# Patient Record
Sex: Female | Born: 1990 | Race: Black or African American | Hispanic: No | Marital: Single | Smoking: Never smoker
Health system: Southern US, Community
[De-identification: ages and names within clinical notes are randomized; demographics above are authoritative.]

## PROBLEM LIST (undated history)

## (undated) ENCOUNTER — Inpatient Hospital Stay: Payer: Self-pay

## (undated) ENCOUNTER — Inpatient Hospital Stay (HOSPITAL_COMMUNITY): Payer: Self-pay

## (undated) DIAGNOSIS — D649 Anemia, unspecified: Secondary | ICD-10-CM

## (undated) DIAGNOSIS — R110 Nausea: Secondary | ICD-10-CM

## (undated) HISTORY — DX: Nausea: R11.0

## (undated) HISTORY — DX: Morbid (severe) obesity due to excess calories: E66.01

## (undated) HISTORY — PX: CYST REMOVAL NECK: SHX6281

---
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
## 2011-05-18 ENCOUNTER — Observation Stay: Payer: Self-pay | Admitting: Obstetrics and Gynecology

## 2011-06-05 ENCOUNTER — Inpatient Hospital Stay: Payer: Self-pay | Admitting: Obstetrics and Gynecology

## 2013-06-10 ENCOUNTER — Encounter: Payer: Self-pay | Admitting: Internal Medicine

## 2013-07-20 ENCOUNTER — Emergency Department: Payer: Self-pay | Admitting: Emergency Medicine

## 2013-07-20 LAB — RAPID INFLUENZA A&B ANTIGENS

## 2013-07-22 ENCOUNTER — Encounter: Payer: Self-pay | Admitting: Obstetrics & Gynecology

## 2013-12-05 ENCOUNTER — Ambulatory Visit: Payer: Self-pay | Admitting: Oncology

## 2013-12-09 ENCOUNTER — Ambulatory Visit: Payer: Self-pay | Admitting: Oncology

## 2013-12-12 ENCOUNTER — Observation Stay: Payer: Self-pay

## 2013-12-20 ENCOUNTER — Inpatient Hospital Stay: Payer: Self-pay | Admitting: Obstetrics and Gynecology

## 2013-12-20 LAB — CBC WITH DIFFERENTIAL/PLATELET
BASOS ABS: 0.1 10*3/uL (ref 0.0–0.1)
BASOS PCT: 0.5 %
Eosinophil #: 0 10*3/uL (ref 0.0–0.7)
Eosinophil %: 0.4 %
HCT: 30 % — AB (ref 35.0–47.0)
HGB: 9.8 g/dL — ABNORMAL LOW (ref 12.0–16.0)
LYMPHS ABS: 2.6 10*3/uL (ref 1.0–3.6)
Lymphocyte %: 21.3 %
MCH: 26.5 pg (ref 26.0–34.0)
MCHC: 32.5 g/dL (ref 32.0–36.0)
MCV: 82 fL (ref 80–100)
MONO ABS: 0.5 x10 3/mm (ref 0.2–0.9)
Monocyte %: 4.1 %
NEUTROS PCT: 73.7 %
Neutrophil #: 8.9 10*3/uL — ABNORMAL HIGH (ref 1.4–6.5)
Platelet: 263 10*3/uL (ref 150–440)
RBC: 3.68 10*6/uL — ABNORMAL LOW (ref 3.80–5.20)
RDW: 15.6 % — ABNORMAL HIGH (ref 11.5–14.5)
WBC: 12.2 10*3/uL — ABNORMAL HIGH (ref 3.6–11.0)

## 2013-12-20 LAB — GC/CHLAMYDIA PROBE AMP

## 2013-12-21 LAB — HEMATOCRIT: HCT: 23.6 % — ABNORMAL LOW (ref 35.0–47.0)

## 2014-01-08 ENCOUNTER — Ambulatory Visit: Payer: Self-pay | Admitting: Oncology

## 2014-11-01 NOTE — Op Note (Signed)
PATIENT NAME:  Laura JanskyMCMILLAN, [place]hsa MR#:  [id number] DATE OF BIRTH:  Dec 05, 1990  DATE OF PROCEDURE:  12/20/2013  PREOPERATIVE DIAGNOSIS:  Fetal intolerance to labor in a term intrauterine pregnancy.  POSTOPERATIVE DIAGNOSIS:  Fetal intolerance to labor in a term intrauterine pregnancy.   PROCEDURE PERFORMED:  Low transverse cesarean section, placement of ON-Q pain pump.   SURGEON:  Annamarie MajorPaul Madilynn Montante, M.D.   ASSISTANT:  Midwife Yetta BarreJones.   ANESTHESIA:  Spinal.   ESTIMATED BLOOD LOSS:  250 mL.   COMPLICATIONS:  None.   FINDINGS:  Normal tubes, ovaries and uterus. Viable infant weighing 7 pounds 11 ounces with Apgar scores of 9 and 10 at 1 and 5 minutes, respectively.   DISPOSITION:  To recovery room in stable condition.   TECHNIQUE:  The patient prepped and draped in the usual sterile fashion after adequate anesthesia is obtained in the supine position on the operating room table. Scalpel is used to create a low transverse skin incision down to the level of the rectus fascia, which was dissected bilaterally using Mayo scissors. Rectus muscle separated in the midline. The peritoneum is penetrated and the bladder is inferiorly dissected and retracted. Scalpel was used to create a low transverse hysterotomy incision that is extended by blunt dissection. Amniotomy reveals clear fluid. The head is easily delivered with suctioning of the oropharynx. No nuchal cord is noted and no vacuum device is needed. The remaining portion of the infant is delivered without complication.   Cord blood is obtained. The placenta is manually extracted. The uterus is externalized and cleansed of all membranes and debris using a moist sponge. Hysterotomy incision is closed with a running #1 Vicryl suture in a locking fashion, followed by a second layer to imbricate the first layer with excellent hemostasis noted.   The uterus is then placed back in the intra-abdominal cavity and the paracolic gutters are irrigated using warm  saline. Re-examination of the incision reveals excellent hemostasis. The peritoneum is closed with a Vicryl suture.   Trocars are placed through the abdomen into the subfascial space and the SilverSoaker catheters associated with the ON-Q pain pump are then threaded into place. The fascia is then closed with 0 Maxon suture with careful placement of suture not to incorporate the catheters. Subcutaneous tissues are irrigated and hemostasis is assured using electrocautery. Skin is closed with surgical clips. Bandages are applied. The ON-Q pain pump catheter is flushed with 5 mL each of bupivacaine and then stabilized into place. The patient goes to the recovery room in stable condition. All sponge, instrument and needle counts are correct.      ____________________________ R. Annamarie MajorPaul Bianca Raneri, MD rph:dmm D: 12/20/2013 11:05:22 ET T: 12/20/2013 11:11:28 ET JOB#: [id number]  cc: Dierdre Searles. Paul Derra Shartzer, MD, <Dictator> Nadara MustardOBERT P Latora Quarry MD ELECTRONICALLY SIGNED 12/20/2013 18:47

## 2014-11-18 NOTE — H&P (Signed)
L&D Evaluation:  History:  HPI 24 yo G2!001 with LMP of 03/16/13 & EDD of 12/21/13  & here as she was getting a blood transfusion and got half of the blood when she started UC's and [facility]. PMH is significant for hx of depression, GBS +, Obesity, Anemia, and Hgb of 7.7. Pt does not feel UC's now and no Vb, ROM or any concerns. Pt has appt at [facility] ion Monday and will call Dr Orlie DakinFinnegan on Friday for blood transfusion, Poor followup missing PNC from 14 to 35 weeks.   Presents with contractions   Patient's Medical History LGSIL,Lt neck cyst,   Patient's Surgical History none   Medications Pre Natal Vitamins   Allergies NKDA   Social History none   Family History Non-Contributory   ROS:  ROS All systems were reviewed.  HEENT, CNS, GI, GU, Respiratory, CV, Renal and Musculoskeletal systems were found to be normal.   Exam:  Vital Signs stable   General no apparent distress   Mental Status clear   Chest clear   Heart normal sinus rhythm, no murmur/gallop/rubs   Abdomen gravid, non-tender   Estimated Fetal Weight Average for gestational age   Back no CVAT   Edema 1+   Reflexes 1+   Mebranes Intact   FHT normal rate with no decels   Ucx irregular   Skin dry   Lymph no lymphadenopathy   Impression:  Impression IUP nat 38 5/7 weeks with Fe  def anemia   Plan:  Comments Pt will call Dr. Orlie DakinFinnegan office tomorrow and then fu at [facility] on Monday.   Electronic Signatures: Sharee PimpleJones, Subrina Vecchiarelli W (CNM)  (Signed 04-Jun-15 21:01)  Authored: L&D Evaluation   Last Updated: 04-Jun-15 21:01 by Sharee PimpleJones, Leahanna Buser W (CNM)

## 2015-05-12 ENCOUNTER — Encounter: Payer: Self-pay | Admitting: Emergency Medicine

## 2015-05-12 ENCOUNTER — Emergency Department
Admission: EM | Admit: 2015-05-12 | Discharge: 2015-05-12 | Disposition: A | Discharge status: Home / Self Care | Payer: Medicaid Other | Attending: Emergency Medicine | Admitting: Emergency Medicine

## 2015-05-12 DIAGNOSIS — R1013 Epigastric pain: Secondary | ICD-10-CM

## 2015-05-12 DIAGNOSIS — Z3202 Encounter for pregnancy test, result negative: Secondary | ICD-10-CM | POA: Diagnosis not present

## 2015-05-12 DIAGNOSIS — R112 Nausea with vomiting, unspecified: Secondary | ICD-10-CM | POA: Insufficient documentation

## 2015-05-12 DIAGNOSIS — R109 Unspecified abdominal pain: Secondary | ICD-10-CM | POA: Diagnosis present

## 2015-05-12 LAB — URINALYSIS COMPLETE WITH MICROSCOPIC (ARMC ONLY)
Bilirubin Urine: NEGATIVE
GLUCOSE, UA: NEGATIVE mg/dL
Hgb urine dipstick: NEGATIVE
KETONES UR: NEGATIVE mg/dL
LEUKOCYTES UA: NEGATIVE
NITRITE: NEGATIVE
PROTEIN: NEGATIVE mg/dL
Specific Gravity, Urine: 1.029 (ref 1.005–1.030)
pH: 7 (ref 5.0–8.0)

## 2015-05-12 LAB — COMPREHENSIVE METABOLIC PANEL
ALBUMIN: 4 g/dL (ref 3.5–5.0)
ALK PHOS: 65 U/L (ref 38–126)
ALT: 30 U/L (ref 14–54)
ANION GAP: 6 (ref 5–15)
AST: 30 U/L (ref 15–41)
BUN: 16 mg/dL (ref 6–20)
CALCIUM: 8.5 mg/dL — AB (ref 8.9–10.3)
CHLORIDE: 106 mmol/L (ref 101–111)
CO2: 26 mmol/L (ref 22–32)
Creatinine, Ser: 0.64 mg/dL (ref 0.44–1.00)
GFR calc Af Amer: >60 mL/min (ref >60)
GFR calc non Af Amer: >60 mL/min (ref >60)
GLUCOSE: 98 mg/dL (ref 65–99)
POTASSIUM: 3.8 mmol/L (ref 3.5–5.1)
SODIUM: 138 mmol/L (ref 135–145)
Total Bilirubin: 0.4 mg/dL (ref 0.3–1.2)
Total Protein: 7.3 g/dL (ref 6.5–8.1)

## 2015-05-12 LAB — CBC
HEMATOCRIT: 39.1 % (ref 35.0–47.0)
HEMOGLOBIN: 12.9 g/dL (ref 12.0–16.0)
MCH: 29.2 pg (ref 26.0–34.0)
MCHC: 33.1 g/dL (ref 32.0–36.0)
MCV: 88.4 fL (ref 80.0–100.0)
PLATELETS: 234 10*3/uL (ref 150–440)
RBC: 4.42 MIL/uL (ref 3.80–5.20)
RDW: 13.5 % (ref 11.5–14.5)
WBC: 9.1 10*3/uL (ref 3.6–11.0)

## 2015-05-12 LAB — POCT PREGNANCY, URINE: PREG TEST UR: NEGATIVE

## 2015-05-12 MED ORDER — ONDANSETRON 4 MG PO TBDP
4.0000 mg | ORAL_TABLET | Freq: Three times a day (TID) | ORAL | Status: DC | PRN
Start: 2015-05-12 — End: 2016-03-07

## 2015-05-12 MED ORDER — HYDROMORPHONE HCL 1 MG/ML IJ SOLN: 1.0000 mg | Freq: Once | INTRAMUSCULAR | Status: DC

## 2015-05-12 MED ORDER — ONDANSETRON HCL 4 MG/2ML IJ SOLN
4.0000 mg | Freq: Once | INTRAMUSCULAR | Status: AC
  Administered 2015-05-12: 4 mg via INTRAVENOUS
  Filled 2015-05-12: qty 2

## 2015-05-12 MED ORDER — ONDANSETRON HCL 4 MG/2ML IJ SOLN: 4.0000 mg | Freq: Once | INTRAMUSCULAR | Status: DC

## 2015-05-12 MED ORDER — KETOROLAC TROMETHAMINE 30 MG/ML IJ SOLN
30.0000 mg | Freq: Once | INTRAMUSCULAR | Status: AC
  Administered 2015-05-12: 30 mg via INTRAVENOUS
  Filled 2015-05-12: qty 1

## 2015-05-12 MED ORDER — FAMOTIDINE IN NACL 20-0.9 MG/50ML-% IV SOLN
20.0000 mg | Freq: Once | INTRAVENOUS | Status: AC
  Administered 2015-05-12: 20 mg via INTRAVENOUS
  Filled 2015-05-12: qty 50

## 2015-05-12 NOTE — Discharge Instructions (Signed)
Please take a clear liquid diet for the next 12-24 hours. The advance to a bland BRAT diet as described as tolerated. Please make a follow up appointment with your primary care physician for follow-up.   Please return to the emergency department if you develop worsening abdominal pain, fever, inability to keep down fluids, or any other symptoms concerning to you.  Abdominal Pain, Adult Many things can cause abdominal pain. Usually, abdominal pain is not caused by a disease and will improve without treatment. It can often be observed and treated at home. Your health care provider will do a physical exam and possibly order blood tests and X-rays to help determine the seriousness of your pain. However, in many cases, more time must pass before a clear cause of the pain can be found. Before that point, your health care provider may not know if you need more testing or further treatment. HOME CARE INSTRUCTIONS Monitor your abdominal pain for any changes. The following actions may help to alleviate any discomfort you are experiencing:  Only take over-the-counter or prescription medicines as directed by your health care provider.  Do not take laxatives unless directed to do so by your health care provider.  Try a clear liquid diet (broth, tea, or water) as directed by your health care provider. Slowly move to a bland diet as tolerated. SEEK MEDICAL CARE IF:  You have unexplained abdominal pain.  You have abdominal pain associated with nausea or diarrhea.  You have pain when you urinate or have a bowel movement.  You experience abdominal pain that wakes you in the night.  You have abdominal pain that is worsened or improved by eating food.  You have abdominal pain that is worsened with eating fatty foods.  You have a fever. SEEK IMMEDIATE MEDICAL CARE IF:  Your pain does not go away within 2 hours.  You keep throwing up (vomiting).  Your pain is felt only in portions of the abdomen, such  as the right side or the left lower portion of the abdomen.  You pass bloody or black tarry stools. MAKE SURE YOU:  Understand these instructions.  Will watch your condition.  Will get help right away if you are not doing well or get worse.   This information is not intended to replace advice given to you by your health care provider. Make sure you discuss any questions you have with your health care provider.   Document Released: 04/06/2005 Document Revised: 03/18/2015 Document Reviewed: 03/06/2013 Elsevier Interactive Patient Education [employer]2016 Elsevier Inc.

## 2015-05-12 NOTE — ED Notes (Signed)
C/o abd. Pain with n,v and chills

## 2015-05-12 NOTE — ED Provider Notes (Signed)
[facility] Emergency Department Provider Note  ____________________________________________  Time seen: Approximately 10:16 AM  I have reviewed the triage vital signs and the nursing notes.   HISTORY  Chief Complaint Abdominal Pain    HPI Laura Lucero is a 24 y.o. female , otherwise healthy, presenting with 2 days of nausea and vomiting and epigastric pain. Patient states that since yesterday she has had "sharp" epigastric pain associated with 4-5 episodes of nausea and vomiting that is nonbloody. She denies any diarrhea or constipation. Last bowel movement was yesterday and it was normal. She is not had any fever, chills, urinary symptoms, change in vaginal discharge. She does not have any headache, lightheadedness or syncope. She has no known sick contacts. She states that she feels better if she is sleeping.    History reviewed. No pertinent past medical history.  There are no active problems to display for this patient.   History reviewed. No pertinent past surgical history.  Current Outpatient Rx  Name  Route  Sig  Dispense  Refill  . ondansetron (ZOFRAN ODT) 4 MG disintegrating tablet   Oral   Take 1 tablet (4 mg total) by mouth every 8 (eight) hours as needed for nausea or vomiting.   20 tablet   0     Allergies Review of patient's allergies indicates no known allergies.  No family history on file.  Social History Social History  Substance Use Topics  . Smoking status: Never Smoker   . Smokeless tobacco: None  . Alcohol Use: No    Review of Systems Constitutional: No fever/chills. No lightheadedness or syncope. Eyes: No visual changes. ENT: No sore throat. Cardiovascular: Denies chest pain, palpitations. Respiratory: Denies shortness of breath.  No cough. Gastrointestinal: Positive epigastric abdominal pain.  Positive nausea, positive vomiting.  No diarrhea.  No constipation. Genitourinary: Negative for dysuria. No change in  vaginal discharge. Musculoskeletal: Negative for back pain. Skin: Negative for rash. Neurological: Negative for headaches, focal weakness or numbness.  10-point ROS otherwise negative.  ____________________________________________   PHYSICAL EXAM:  VITAL SIGNS: ED Triage Vitals  Enc Vitals Group     BP 05/12/15 0851 102/77 mmHg     Pulse Rate 05/12/15 0851 89     Resp 05/12/15 0851 18     Temp 05/12/15 0851 98.9 F (37.2 C)     Temp Source 05/12/15 0851 Oral     SpO2 05/12/15 0851 99 %     Weight 05/12/15 0851 216 lb (97.977 kg)     Height 05/12/15 0851  (1.6 m)     Head Cir --      Peak Flow --      Pain Score 05/12/15 0852 7     Pain Loc --      Pain Edu? --      Excl. in GC? --     Constitutional: Alert and oriented. Well appearing and in no acute distress. Answer question appropriately. Eyes: Conjunctivae are normal.  EOMI. Head: Atraumatic. Nose: No congestion/rhinnorhea. Mouth/Throat: Mucous membranes are moist.  Neck: No stridor.  Supple.   Cardiovascular: Normal rate, regular rhythm. No murmurs, rubs or gallops.  Respiratory: Normal respiratory effort.  No retractions. Lungs CTAB.  No wheezes, rales or ronchi. Gastrointestinal: Mild epigastric tenderness to palpation without rebound or guarding. Abdomen is soft and nondistended. There are no peritoneal set signs. No Murphy sign.  Musculoskeletal: No LE edema.  Neurologic:  Normal speech and language. No gross focal neurologic deficits are appreciated.  Skin:  Skin is warm, dry and intact. No rash noted. Psychiatric: Mood and affect are normal. Speech and behavior are normal.  Normal judgement.  ____________________________________________   LABS (all labs ordered are listed, but only abnormal results are displayed)  Labs Reviewed  COMPREHENSIVE METABOLIC PANEL - Abnormal; Notable for the following:    Calcium 8.5 (*)    All other components within normal limits  URINALYSIS COMPLETEWITH MICROSCOPIC  ([facility] ONLY) - Abnormal; Notable for the following:    Color, Urine YELLOW (*)    APPearance CLEAR (*)    Bacteria, UA FEW (*)    Squamous Epithelial / LPF 6-30 (*)    All other components within normal limits  CBC  POCT PREGNANCY, URINE   ____________________________________________  EKG  Not indicated ____________________________________________  RADIOLOGY  No results found.  ____________________________________________   PROCEDURES  Procedure(s) performed: None  Critical Care performed: No ____________________________________________   INITIAL IMPRESSION / ASSESSMENT AND PLAN / ED COURSE  Pertinent labs & imaging results that were available during my care of the patient were reviewed by me and considered in my medical decision making (see chart for details).  24 y.o. female, otherwise healthy, with 2 days of nausea and vomiting, mild epigastric pain. On my exam her abdominal exam is reassuring and is not consistent with acute surgical pathology including cholecystitis. As likely etiology of her pain is a viral or foodborne GI illness. I have given her instructions about appendectomy precautions in case she worsens. The labs that she had done from triage are reassuring with normal white blood cell count and normal electrolytes. I'm awaiting her urinalysis and to rule out pregnancy.   ----------------------------------------- 10:58 AM on 05/12/2015 -----------------------------------------  The patient is resting comfortably and I had to wake her up in order to speak with her. She will give a urine sample at this time. She has not had any further episodes of vomiting at this time. I anticipate discharge home after by mouth challenge.  ----------------------------------------- 12:27 PM on 05/12/2015 -----------------------------------------  Patient's labs are reassuring, she continues to be free of any nausea or vomiting. Her pain and nausea have completely resolved.  Plan to discharge home. I have given her appendectomy precautions. She understands return precautions and follow-up instructions. ____________________________________________  FINAL CLINICAL IMPRESSION(S) / ED DIAGNOSES  Final diagnoses:  Epigastric pain  Non-intractable vomiting with nausea, vomiting of unspecified type      NEW MEDICATIONS STARTED DURING THIS VISIT:  New Prescriptions   ONDANSETRON (ZOFRAN ODT) 4 MG DISINTEGRATING TABLET    Take 1 tablet (4 mg total) by mouth every 8 (eight) hours as needed for nausea or vomiting.     Rockne MenghiniAnne-Caroline Abhijot Straughter, MD 05/12/15 1233

## 2015-05-12 NOTE — ED Notes (Signed)
Pt discharged home after verbalizing understanding of discharge instructions; nad noted. 

## 2015-10-11 ENCOUNTER — Emergency Department
Admission: EM | Admit: 2015-10-11 | Discharge: 2015-10-11 | Disposition: A | Discharge status: Home / Self Care | Payer: Medicaid Other | Attending: Emergency Medicine | Admitting: Emergency Medicine

## 2015-10-11 ENCOUNTER — Encounter: Payer: Self-pay | Admitting: Emergency Medicine

## 2015-10-11 DIAGNOSIS — J302 Other seasonal allergic rhinitis: Secondary | ICD-10-CM | POA: Diagnosis not present

## 2015-10-11 DIAGNOSIS — J029 Acute pharyngitis, unspecified: Secondary | ICD-10-CM | POA: Diagnosis not present

## 2015-10-11 DIAGNOSIS — R0982 Postnasal drip: Secondary | ICD-10-CM | POA: Insufficient documentation

## 2015-10-11 LAB — POCT RAPID STREP A: Streptococcus, Group A Screen (Direct): NEGATIVE

## 2015-10-11 MED ORDER — FLUTICASONE PROPIONATE 50 MCG/ACT NA SUSP: 2.0000 | Freq: Every day | NASAL | Status: DC

## 2015-10-11 MED ORDER — CETIRIZINE HCL 5 MG PO TABS: 5.0000 mg | ORAL_TABLET | Freq: Every day | ORAL | Status: DC

## 2015-10-11 NOTE — ED Provider Notes (Signed)
CSN: [id number]     Arrival date & time 10/11/15  1818 History   First MD Initiated Contact with Patient 10/11/15 2014     Chief Complaint  Patient presents with  . Generalized Body Aches  . Sore Throat   HPI The patient is a 25 year old female who is at [redacted] weeks gestation of a singleton pregnancy, who presents to the ED with complaints of sore throat and body aches since yesterday. She is unaware of any feversOr chills, or sweats at this time. She describes some sinus drainage as well as some postnasal drainage she denies any cough, congestion, or flulike symptoms. She denies any abdominal pain, vaginal bleeding, or pelvic discomfort.  History reviewed. No pertinent past medical history. Past Surgical History  Procedure Laterality Date  . Cyst removal neck     History reviewed. No pertinent family history. Social History  Substance Use Topics  . Smoking status: Never Smoker   . Smokeless tobacco: None  . Alcohol Use: No   OB History    Gravida Para Term Preterm AB TAB SAB Ectopic Multiple Living   1              Review of Systems  Constitutional: Negative for fever, chills and diaphoresis.  HENT: Positive for postnasal drip, rhinorrhea and sore throat. Negative for ear discharge and ear pain.   Respiratory: Negative.   Allergic/Immunologic: Positive for environmental allergies.   Allergies  Review of patient's allergies indicates no known allergies.  Home Medications   Prior to Admission medications   Medication Sig Start Date End Date Taking? Authorizing Provider  cetirizine (ZYRTEC) 5 MG tablet Take 1 tablet (5 mg total) by mouth daily. 10/11/15   Merritt Mccravy V Bacon Katana Berthold, PA-C  fluticasone (FLONASE) 50 MCG/ACT nasal spray Place 2 sprays into both nostrils daily. 10/11/15   Semir Brill V Bacon Brookelynn Hamor, PA-C  ondansetron (ZOFRAN ODT) 4 MG disintegrating tablet Take 1 tablet (4 mg total) by mouth every 8 (eight) hours as needed for nausea or vomiting. 05/12/15   Anne-Caroline Sharma Covert, MD    BP 114/69 mmHg  Pulse 107  Temp(Src) 99.3 F (37.4 C) (Oral)  Resp 18  Ht  (1.626 m)  Wt 106.595 kg  BMI 40.32 kg/m2  SpO2 99%  LMP  (LMP Unknown) Physical Exam  Constitutional: She appears well-developed and well-nourished.  HENT:  Head: Normocephalic and atraumatic.  Right Ear: External ear normal.  Left Ear: External ear normal.  Nose: Nose normal.  Mouth/Throat: Oropharynx is clear and moist. No oropharyngeal exudate.  Eyes: Conjunctivae and EOM are normal. Pupils are equal, round, and reactive to light.  Neck: Normal range of motion. Neck supple.  Skin: Skin is warm and dry.    ED Course  Procedures (including critical care time) Labs Review Labs Reviewed  CULTURE, GROUP A STREP [facility])  POCT RAPID STREP A    Imaging Review No results found. I have personally reviewed and evaluated these images and lab results as part of my medical decision-making.   EKG Interpretation None      MDM   Final diagnoses:  Sore throat  Other seasonal allergic rhinitis  Post-nasal drip    Patient with an exam consistent with some mild rhinitis and postnasal drainage. This is likely the source a scratchy, sore throat." Patient's exam gives low suspicion for strep pharyngitis. Throat culture is pending at the time of discharge. Patient will be discharged with prescriptions for Flonase, and cetirizine the dose as directed. She will [facility]  [facility].    Charlesetta IvoryJenise V Bacon WeatherlyMenshew, PA-C 10/11/15 2101  Jennye MoccasinBrian S Quigley, MD 10/12/15 [phone]

## 2015-10-11 NOTE — Discharge Instructions (Signed)
Allergic Rhinitis Allergic rhinitis is when the mucous membranes in the nose respond to allergens. Allergens are particles in the air that cause your body to have an allergic reaction. This causes you to release allergic antibodies. Through a chain of events, these eventually cause you to release histamine into the blood stream. Although meant to protect the body, it is this release of histamine that causes your discomfort, such as frequent sneezing, congestion, and an itchy, runny nose.  CAUSES Seasonal allergic rhinitis (hay fever) is caused by pollen allergens that may come from grasses, trees, and weeds. Year-round allergic rhinitis (perennial allergic rhinitis) is caused by allergens such as house dust mites, pet dander, and mold spores. SYMPTOMS  Nasal stuffiness (congestion).  Itchy, runny nose with sneezing and tearing of the eyes. DIAGNOSIS Your health care provider can help you determine the allergen or allergens that trigger your symptoms. If you and your health care provider are unable to determine the allergen, skin or blood testing may be used. Your health care provider will diagnose your condition after taking your health history and performing a physical exam. Your health care provider may assess you for other related conditions, such as asthma, pink eye, or an ear infection. TREATMENT Allergic rhinitis does not have a cure, but it can be controlled by:  Medicines that block allergy symptoms. These may include allergy shots, nasal sprays, and oral antihistamines.  Avoiding the allergen. Hay fever may often be treated with antihistamines in pill or nasal spray forms. Antihistamines block the effects of histamine. There are over-the-counter medicines that may help with nasal congestion and swelling around the eyes. Check with your health care provider before taking or giving this medicine. If avoiding the allergen or the medicine prescribed do not work, there are many new medicines  your health care provider can prescribe. Stronger medicine may be used if initial measures are ineffective. Desensitizing injections can be used if medicine and avoidance does not work. Desensitization is when a patient is given ongoing shots until the body becomes less sensitive to the allergen. Make sure you follow up with your health care provider if problems continue. HOME CARE INSTRUCTIONS It is not possible to completely avoid allergens, but you can reduce your symptoms by taking steps to limit your exposure to them. It helps to know exactly what you are allergic to so that you can avoid your specific triggers. SEEK MEDICAL CARE IF:  You have a fever.  You develop a cough that does not stop easily (persistent).  You have shortness of breath.  You start wheezing.  Symptoms interfere with normal daily activities.   This information is not intended to replace advice given to you by your health care provider. Make sure you discuss any questions you have with your health care provider.   Document Released: 03/22/2001 Document Revised: 07/18/2014 Document Reviewed: 03/04/2013 Elsevier Interactive Patient Education 2016 Elsevier Inc.  Sore Throat A sore throat is pain, burning, irritation, or scratchiness of the throat. There is often pain or tenderness when swallowing or talking. A sore throat may be accompanied by other symptoms, such as coughing, sneezing, fever, and swollen neck glands. A sore throat is often the first sign of another sickness, such as a cold, flu, strep throat, or mononucleosis (commonly known as mono). Most sore throats go away without medical treatment. CAUSES  The most common causes of a sore throat include:  A viral infection, such as a cold, flu, or mono.  A bacterial infection, such as  strep throat, tonsillitis, or whooping cough.  Seasonal allergies.  Dryness in the air.  Irritants, such as smoke or pollution.  Gastroesophageal reflux disease  (GERD). HOME CARE INSTRUCTIONS   Only take over-the-counter medicines as directed by your caregiver.  Drink enough fluids to keep your urine clear or pale yellow.  Rest as needed.  Try using throat sprays, lozenges, or sucking on hard candy to ease any pain (if older than 4 years or as directed).  Sip warm liquids, such as broth, herbal tea, or warm water with honey to relieve pain temporarily. You may also eat or drink cold or frozen liquids such as frozen ice pops.  Gargle with salt water (mix 1 tsp salt with 8 oz of water).  Do not smoke and avoid secondhand smoke.  Put a cool-mist humidifier in your bedroom at night to moisten the air. You can also turn on a hot shower and sit in the bathroom with the door closed for 5-10 minutes. SEEK IMMEDIATE MEDICAL CARE IF:  You have difficulty breathing.  You are unable to swallow fluids, soft foods, or your saliva.  You have increased swelling in the throat.  Your sore throat does not get better in 7 days.  You have nausea and vomiting.  You have a fever or persistent symptoms for more than 2-3 days.  You have a fever and your symptoms suddenly get worse. MAKE SURE YOU:   Understand these instructions.  Will watch your condition.  Will get help right away if you are not doing well or get worse.   This information is not intended to replace advice given to you by your health care provider. Make sure you discuss any questions you have with your health care provider.   Document Released: 08/04/2004 Document Revised: 07/18/2014 Document Reviewed: 03/04/2012 Elsevier Interactive Patient Education [employer]  Your exam is normal today. Your rapid strep test is negative. Your throat culture is pending. Take the prescription meds as directed. [facility]

## 2015-10-11 NOTE — ED Notes (Signed)
Pt reports body aches and sore throat since yesterday.  Denies fevers.  Is [redacted] weeks pregnant but no complaints or sx related to pregnancy.  NAD.

## 2015-10-14 LAB — CULTURE, GROUP A STREP (THRC)

## 2015-10-15 NOTE — Progress Notes (Addendum)
ED Culture Report Follow up by Pharmacy  Throat culture report from 10/11/15 growing Group A strep, no abx given on discharge. Called pt's phone number listed [phone]). Number states "subscriber you have dialed is not in service." Called pharmacy listed in chart and called in prescription: [facility] on [address], spoke with Heidi Dachharles RPh at [phone]. Called in amoxicillin 500 mg PO BID x10 days, #20, no refills (Dr. Lenard LancePaduchowski).  Called the [facility] (listed in ED note as PCP). Spoke to Rock[place] who gave me [phone] as the pt's number. This number states "subscriber does not accept incoming calls." Faxed culture results to [facility] at [phone] Verlee Monte(Kelsey Walch, PA is the pt's provider) at [phone].

## 2015-11-24 LAB — HM PAP SMEAR: HM PAP: NEGATIVE

## 2015-12-08 ENCOUNTER — Emergency Department: Payer: Medicaid Other

## 2015-12-08 ENCOUNTER — Encounter: Payer: Self-pay | Admitting: Medical Oncology

## 2015-12-08 ENCOUNTER — Emergency Department
Admission: EM | Admit: 2015-12-08 | Discharge: 2015-12-08 | Disposition: A | Discharge status: Home / Self Care | Payer: Medicaid Other | Attending: Emergency Medicine | Admitting: Emergency Medicine

## 2015-12-08 DIAGNOSIS — S63501A Unspecified sprain of right wrist, initial encounter: Secondary | ICD-10-CM | POA: Insufficient documentation

## 2015-12-08 DIAGNOSIS — Z79899 Other long term (current) drug therapy: Secondary | ICD-10-CM | POA: Insufficient documentation

## 2015-12-08 DIAGNOSIS — X509XXA Other and unspecified overexertion or strenuous movements or postures, initial encounter: Secondary | ICD-10-CM | POA: Diagnosis not present

## 2015-12-08 DIAGNOSIS — Y999 Unspecified external cause status: Secondary | ICD-10-CM | POA: Insufficient documentation

## 2015-12-08 DIAGNOSIS — Y929 Unspecified place or not applicable: Secondary | ICD-10-CM | POA: Diagnosis not present

## 2015-12-08 DIAGNOSIS — S6991XA Unspecified injury of right wrist, hand and finger(s), initial encounter: Secondary | ICD-10-CM | POA: Diagnosis present

## 2015-12-08 DIAGNOSIS — Y9389 Activity, other specified: Secondary | ICD-10-CM | POA: Insufficient documentation

## 2015-12-08 MED ORDER — TRAMADOL HCL 50 MG PO TABS
50.0000 mg | ORAL_TABLET | Freq: Once | ORAL | Status: AC
  Administered 2015-12-08: 50 mg via ORAL
  Filled 2015-12-08: qty 1

## 2015-12-08 MED ORDER — IBUPROFEN 800 MG PO TABS: 800.0000 mg | ORAL_TABLET | Freq: Three times a day (TID) | ORAL | Status: DC | PRN

## 2015-12-08 MED ORDER — IBUPROFEN 800 MG PO TABS
800.0000 mg | ORAL_TABLET | Freq: Once | ORAL | Status: DC
  Filled 2015-12-08: qty 1

## 2015-12-08 MED ORDER — TRAMADOL HCL 50 MG PO TABS: 50.0000 mg | ORAL_TABLET | Freq: Four times a day (QID) | ORAL | Status: DC | PRN
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
## 2015-12-08 NOTE — Discharge Instructions (Signed)
Wear splint for 3-5 days as needed. °

## 2015-12-08 NOTE — ED Notes (Signed)
Pt reports injuring rt hand last night.
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
## 2016-02-12 LAB — OB RESULTS CONSOLE GBS: GBS: POSITIVE

## 2016-03-07 ENCOUNTER — Encounter
Admission: EM | Disposition: A | Discharge status: Home / Self Care | Payer: Self-pay | Source: Home / Self Care | Attending: Obstetrics and Gynecology

## 2016-03-07 ENCOUNTER — Inpatient Hospital Stay: Payer: Medicaid Other | Admitting: Anesthesiology

## 2016-03-07 ENCOUNTER — Other Ambulatory Visit: Payer: Medicaid Other

## 2016-03-07 ENCOUNTER — Inpatient Hospital Stay
Admission: EM | Admit: 2016-03-07 | Discharge: 2016-03-11 | DRG: 765 | Disposition: A | Discharge status: Home / Self Care | Payer: Medicaid Other | Attending: Obstetrics and Gynecology | Admitting: Obstetrics and Gynecology

## 2016-03-07 ENCOUNTER — Encounter
Admission: RE | Admit: 2016-03-07 | Discharge: 2016-03-07 | Disposition: A | Discharge status: Home / Self Care | Payer: Medicaid Other | Source: Ambulatory Visit | Attending: Obstetrics and Gynecology | Admitting: Obstetrics and Gynecology

## 2016-03-07 ENCOUNTER — Observation Stay
Admission: RE | Admit: 2016-03-07 | Discharge: 2016-03-07 | Disposition: A | Discharge status: Home / Self Care | Payer: Medicaid Other | Source: Ambulatory Visit | Attending: Obstetrics and Gynecology | Admitting: Obstetrics and Gynecology

## 2016-03-07 ENCOUNTER — Inpatient Hospital Stay: Admit: 2016-03-07 | Payer: Self-pay

## 2016-03-07 DIAGNOSIS — O0990 Supervision of high risk pregnancy, unspecified, unspecified trimester: Secondary | ICD-10-CM

## 2016-03-07 DIAGNOSIS — Z3A40 40 weeks gestation of pregnancy: Secondary | ICD-10-CM | POA: Diagnosis not present

## 2016-03-07 DIAGNOSIS — D62 Acute posthemorrhagic anemia: Secondary | ICD-10-CM | POA: Diagnosis present

## 2016-03-07 DIAGNOSIS — O34211 Maternal care for low transverse scar from previous cesarean delivery: Secondary | ICD-10-CM | POA: Diagnosis present

## 2016-03-07 DIAGNOSIS — Z3A39 39 weeks gestation of pregnancy: Secondary | ICD-10-CM

## 2016-03-07 DIAGNOSIS — O34219 Maternal care for unspecified type scar from previous cesarean delivery: Secondary | ICD-10-CM

## 2016-03-07 DIAGNOSIS — O9902 Anemia complicating childbirth: Secondary | ICD-10-CM | POA: Diagnosis present

## 2016-03-07 DIAGNOSIS — Z98891 History of uterine scar from previous surgery: Secondary | ICD-10-CM

## 2016-03-07 HISTORY — DX: Anemia, unspecified: D64.9

## 2016-03-07 LAB — CBC
HEMATOCRIT: 29.3 % — AB (ref 35.0–47.0)
HEMOGLOBIN: 9.9 g/dL — AB (ref 12.0–16.0)
MCH: 28.3 pg (ref 26.0–34.0)
MCHC: 33.9 g/dL (ref 32.0–36.0)
MCV: 83.5 fL (ref 80.0–100.0)
Platelets: 244 10*3/uL (ref 150–440)
RBC: 3.51 MIL/uL — ABNORMAL LOW (ref 3.80–5.20)
RDW: 14.4 % (ref 11.5–14.5)
WBC: 8.9 10*3/uL (ref 3.6–11.0)

## 2016-03-07 LAB — RAPID HIV SCREEN (HIV 1/2 AB+AG)
HIV 1/2 ANTIBODIES: NONREACTIVE
HIV-1 P24 Antigen - HIV24: NONREACTIVE

## 2016-03-07 LAB — TYPE AND SCREEN
ABO/RH(D): A POS
ANTIBODY SCREEN: NEGATIVE
EXTEND SAMPLE REASON: UNDETERMINED

## 2016-03-07 SURGERY — Surgical Case
Anesthesia: Spinal | Site: Abdomen | Wound class: Clean Contaminated

## 2016-03-07 MED ORDER — KETOROLAC TROMETHAMINE 30 MG/ML IJ SOLN
30.0000 mg | Freq: Four times a day (QID) | INTRAMUSCULAR | Status: AC
  Administered 2016-03-07 – 2016-03-08 (×3): 30 mg via INTRAVENOUS
  Filled 2016-03-07 (×3): qty 1

## 2016-03-07 MED ORDER — LACTATED RINGERS IV SOLN
INTRAVENOUS | Status: DC
  Administered 2016-03-07: 22:00:00 via INTRAVENOUS

## 2016-03-07 MED ORDER — SOD CITRATE-CITRIC ACID 500-334 MG/5ML PO SOLN
30.0000 mL | ORAL | Status: AC
Start: 2016-03-07 — End: 2016-03-07
  Administered 2016-03-07: 30 mL via ORAL
  Filled 2016-03-07: qty 30

## 2016-03-07 MED ORDER — LACTATED RINGERS IV SOLN: 500.0000 mL | INTRAVENOUS | Status: DC | PRN

## 2016-03-07 MED ORDER — LACTATED RINGERS IV SOLN
INTRAVENOUS | Status: DC | PRN
Start: 2016-03-07 — End: 2016-03-07
  Administered 2016-03-07 (×2): via INTRAVENOUS

## 2016-03-07 MED ORDER — BUPIVACAINE HCL (PF) 0.5 % IJ SOLN: 5.0000 mL | Freq: Once | INTRAMUSCULAR | Status: DC

## 2016-03-07 MED ORDER — BUPIVACAINE IN DEXTROSE 0.75-8.25 % IT SOLN
INTRATHECAL | Status: DC | PRN
  Administered 2016-03-07: 1.8 mL via INTRATHECAL

## 2016-03-07 MED ORDER — MORPHINE SULFATE (PF) 0.5 MG/ML IJ SOLN
INTRAMUSCULAR | Status: DC | PRN
  Administered 2016-03-07: .1 mg via INTRATHECAL

## 2016-03-07 MED ORDER — ACETAMINOPHEN 325 MG PO TABS
650.0000 mg | ORAL_TABLET | Freq: Four times a day (QID) | ORAL | Status: AC
  Administered 2016-03-08 (×4): 650 mg via ORAL
  Filled 2016-03-07 (×3): qty 2

## 2016-03-07 MED ORDER — BUPIVACAINE 0.25 % ON-Q PUMP DUAL CATH 400 ML
400.0000 mL | INJECTION | Status: DC
  Filled 2016-03-07: qty 400

## 2016-03-07 MED ORDER — LIDOCAINE HCL (PF) 1 % IJ SOLN: 30.0000 mL | INTRAMUSCULAR | Status: DC | PRN

## 2016-03-07 MED ORDER — FENTANYL CITRATE (PF) 100 MCG/2ML IJ SOLN
INTRAMUSCULAR | Status: DC | PRN
  Administered 2016-03-07: 15 ug via INTRATHECAL

## 2016-03-07 MED ORDER — GENTAMICIN SULFATE 40 MG/ML IJ SOLN
5.0000 mg/kg | INTRAVENOUS | Status: AC
  Administered 2016-03-07: 544.4 mg via INTRAVENOUS
  Filled 2016-03-07: qty 13.5

## 2016-03-07 MED ORDER — ONDANSETRON HCL 4 MG/2ML IJ SOLN: 4.0000 mg | Freq: Four times a day (QID) | INTRAMUSCULAR | Status: DC | PRN

## 2016-03-07 MED ORDER — OXYTOCIN 40 UNITS IN LACTATED RINGERS INFUSION - SIMPLE MED
2.5000 [IU]/h | INTRAVENOUS | Status: DC
  Administered 2016-03-07: 1 mL via INTRAVENOUS
  Administered 2016-03-07: 399 mL via INTRAVENOUS

## 2016-03-07 MED ORDER — TERBUTALINE SULFATE 1 MG/ML IJ SOLN: 0.2500 mg | Freq: Once | INTRAMUSCULAR | Status: DC

## 2016-03-07 MED ORDER — NALBUPHINE HCL 10 MG/ML IJ SOLN
5.0000 mg | INTRAMUSCULAR | Status: DC | PRN
  Administered 2016-03-07 – 2016-03-08 (×3): 5 mg via INTRAVENOUS
  Filled 2016-03-07 (×3): qty 1

## 2016-03-07 MED ORDER — PHENYLEPHRINE HCL 10 MG/ML IJ SOLN
INTRAMUSCULAR | Status: DC | PRN
  Administered 2016-03-07 (×2): 100 ug via INTRAVENOUS
  Administered 2016-03-07: 50 ug via INTRAVENOUS

## 2016-03-07 MED ORDER — KETOROLAC TROMETHAMINE 30 MG/ML IJ SOLN
INTRAMUSCULAR | Status: AC
  Filled 2016-03-07: qty 1

## 2016-03-07 MED ORDER — NALBUPHINE HCL 10 MG/ML IJ SOLN
INTRAMUSCULAR | Status: AC
  Filled 2016-03-07: qty 1

## 2016-03-07 MED ORDER — CLINDAMYCIN PHOSPHATE 900 MG/50ML IV SOLN
900.0000 mg | INTRAVENOUS | Status: AC
  Administered 2016-03-07: 900 mg via INTRAVENOUS
  Filled 2016-03-07: qty 50

## 2016-03-07 MED ORDER — OXYTOCIN BOLUS FROM INFUSION: 500.0000 mL | Freq: Once | INTRAVENOUS | Status: DC

## 2016-03-07 MED ORDER — BUPIVACAINE HCL (PF) 0.5 % IJ SOLN
INTRAMUSCULAR | Status: DC | PRN
  Administered 2016-03-07: 10 mL

## 2016-03-07 MED ORDER — NALBUPHINE HCL 10 MG/ML IJ SOLN
5.0000 mg | INTRAMUSCULAR | Status: DC | PRN
  Administered 2016-03-08: 5 mg via SUBCUTANEOUS

## 2016-03-07 MED ORDER — OXYTOCIN 10 UNIT/ML IJ SOLN: 10.0000 [IU] | Freq: Once | INTRAMUSCULAR | Status: DC

## 2016-03-07 MED ORDER — BUPIVACAINE HCL (PF) 0.5 % IJ SOLN
5.0000 mL | Freq: Once | INTRAMUSCULAR | Status: DC
  Filled 2016-03-07: qty 30

## 2016-03-07 MED ORDER — ONDANSETRON HCL 4 MG/2ML IJ SOLN
INTRAMUSCULAR | Status: DC | PRN
Start: 2016-03-07 — End: 2016-03-07
  Administered 2016-03-07: 4 mg via INTRAVENOUS

## 2016-03-07 MED ORDER — KETOROLAC TROMETHAMINE 30 MG/ML IJ SOLN: 30.0000 mg | Freq: Four times a day (QID) | INTRAMUSCULAR | Status: AC

## 2016-03-07 MED ORDER — BETAMETHASONE SOD PHOS & ACET 6 (3-3) MG/ML IJ SUSP: 12.0000 mg | Freq: Once | INTRAMUSCULAR | Status: DC

## 2016-03-07 SURGICAL SUPPLY — 32 items
CANISTER SUCT 3000ML (MISCELLANEOUS) ×4 IMPLANT
CATH KIT ON-Q SILVERSOAK 5IN (CATHETERS) ×8 IMPLANT
CHLORAPREP W/TINT 26ML (MISCELLANEOUS) ×8 IMPLANT
CLOSURE WOUND 1/2 X4 (GAUZE/BANDAGES/DRESSINGS)
CUP MEDICINE 2OZ PLAST GRAD ST (MISCELLANEOUS) IMPLANT
DRSG OPSITE POSTOP 4X10 (GAUZE/BANDAGES/DRESSINGS) ×4 IMPLANT
DRSG TELFA 3X8 NADH (GAUZE/BANDAGES/DRESSINGS) IMPLANT
ELECT REM PT RETURN 9FT ADLT (ELECTROSURGICAL) ×4
ELECTRODE REM PT RTRN 9FT ADLT (ELECTROSURGICAL) ×2 IMPLANT
GAUZE SPONGE 4X4 12PLY STRL (GAUZE/BANDAGES/DRESSINGS) IMPLANT
GLOVE BIO SURGEON STRL SZ8 (GLOVE) ×8 IMPLANT
GLOVE BIOGEL PI IND STRL 7.5 (GLOVE) ×4 IMPLANT
GLOVE BIOGEL PI INDICATOR 7.5 (GLOVE) ×4
GLOVE SKINSENSE NS SZ7.0 (GLOVE) ×4
GLOVE SKINSENSE STRL SZ7.0 (GLOVE) ×4 IMPLANT
GOWN STRL REUS W/ TWL LRG LVL3 (GOWN DISPOSABLE) ×4 IMPLANT
GOWN STRL REUS W/TWL LRG LVL3 (GOWN DISPOSABLE) ×4
LIQUID BAND (GAUZE/BANDAGES/DRESSINGS) ×8 IMPLANT
NDL SAFETY 22GX1.5 (NEEDLE) IMPLANT
NS IRRIG 1000ML POUR BTL (IV SOLUTION) ×12 IMPLANT
PACK C SECTION AR (MISCELLANEOUS) ×4 IMPLANT
PAD OB MATERNITY 4.3X12.25 (PERSONAL CARE ITEMS) ×4 IMPLANT
PAD PREP 24X41 OB/GYN DISP (PERSONAL CARE ITEMS) ×4 IMPLANT
SLEEVE SCD COMPRESS THIGH MED (MISCELLANEOUS) ×4 IMPLANT
STRIP CLOSURE SKIN 1/2X4 (GAUZE/BANDAGES/DRESSINGS) IMPLANT
SUT 2-0 PL GUT LIGAPAK (SUTURE) IMPLANT
SUT MNCRL AB 4-0 PS2 18 (SUTURE) ×4 IMPLANT
SUT PDS AB 1 TP1 96 (SUTURE) ×8 IMPLANT
SUT VIC AB 0 CT1 36 (SUTURE) ×12 IMPLANT
SUT VIC AB 1 CT1 36 (SUTURE) ×4 IMPLANT
SWABSTK COMLB BENZOIN TINCTURE (MISCELLANEOUS) IMPLANT
SYRINGE 10CC LL (SYRINGE) IMPLANT

## 2016-03-07 NOTE — Patient Instructions (Signed)
  Your procedure is scheduled on: March 08, 2016 (Tuesday) Report to EMERGENCY DEPARTMENT To find out your arrival time please call [phone] between 1PM - 3PM on ARRIVAL TIME 5:30 AM  Remember: Instructions that are not followed completely may result in serious medical risk, up to and including death, or upon the discretion of your surgeon and anesthesiologist your surgery may need to be rescheduled.    _x___ 1. Do not eat food or drink liquids after midnight. No gum chewing or hard candies.     _x__ 2. No Alcohol for 24 hours before or after surgery.   x__3. No Smoking for 24 prior to surgery.   ____  4. Bring all medications with you on the day of surgery if instructed.    __x__ 5. Notify your doctor if there is any change in your medical condition     (cold, fever, infections).     Do not wear jewelry, make-up, hairpins, clips or nail polish.  Do not wear lotions, powders, or perfumes. You may wear deodorant.  Do not shave 48 hours prior to surgery. Men may shave face and neck.  Do not bring valuables to the hospital.    [facility] is not responsible for any belongings or valuables.               Contacts, dentures or bridgework may not be worn into surgery.  Leave your suitcase in the car. After surgery it may be brought to your room.  For patients admitted to the hospital, discharge time is determined by your treatment team.   Patients discharged the day of surgery will not be allowed to drive home.    Please read over the following fact sheets that you were given:   [facility] Preparing for Surgery and or MRSA Information   ___ Take these medicines the morning of surgery with A SIP OF WATER:    1.   2.  3.  4.  5.  6.  ____ Fleet Enema (as directed)   _x___ Use CHG Soap or sage wipes as directed on instruction sheet   ____ Use inhalers on the day of surgery and bring to hospital day of surgery  ____ Stop metformin 2 days prior to surgery    ____ Take  1/2 of usual insulin dose the night before surgery and none on the morning of  Surgery.          ____ Stop aspirin or coumadin, or plavix  _x__ Stop Anti-inflammatories such as Advil, Aleve, Ibuprofen, Motrin, Naproxen,          Naprosyn, Goodies powders or aspirin products. Ok to take Tylenol.   ____ Stop supplements until after surgery.    ____ Bring C-Pap to the hospital.

## 2016-03-07 NOTE — OB Triage Note (Signed)
Pt presents to L&D with contractions. Was seen in L&D today and sent home, pt is scheduled for a c/s in the morning. Reports contractions increasing at 1700 with vaginal bleeding. No LOF, and reports good faeatl movemnt

## 2016-03-07 NOTE — Transfer of Care (Signed)
Immediate Anesthesia Transfer of Care Note  Patient: Laura Lucero  Procedure(s) Performed: Procedure(s): CESAREAN SECTION (N/A)  Patient Location: PACU  Anesthesia Type:Spinal  Level of Consciousness: awake, alert , oriented and patient cooperative  Airway & Oxygen Therapy: Patient Spontanous Breathing  Post-op Assessment: Report given to RN and Post -op Vital signs reviewed and stable  Post vital signs: Reviewed and stable  Last Vitals:  Vitals:   03/07/16 2104  BP: 136/76  Pulse: 69  Resp: 18  Temp: 36.4 C    Last Pain:  Vitals:   03/07/16 2106  TempSrc:   PainSc: 10-Worst pain ever         Complications: No apparent anesthesia complications

## 2016-03-07 NOTE — Pre-Procedure Instructions (Signed)
Arrived at Pre-Admit Testing for pre-op interview for C-Section tomorrow (August 29) c/o contractions. Labor and Delivery notified, instructed to complete pre-op interview,then send patient to L&D. Patient  stated with C-Section in 2015, she received medication during labor that caused her "throat to swell", and could not remember the name of medication.Patient escorted to L&D after pre-op visit , [facility] including info regarding medication allergy.

## 2016-03-07 NOTE — Discharge Summary (Signed)
  OB Discharge Summary     Patient Name: Laura JanskyLarisa Mariotti DOB: 02/29/1991 MRN: [id number]  Date of admission: 03/07/2016 Delivering MD: Conard NovakJackson, Stephen D, MD  Date of Delivery: 03/07/2016  Date of discharge: 03/11/2016  Admitting diagnosis: contractions term pregnancy,prior csection,desire for permanent sterility Intrauterine pregnancy: 39wk2d     Secondary diagnosis: None     Discharge diagnosis: Term Pregnancy Delivered                                                                                                Post partum procedures:none  Augmentation: n/a  Complications: None  Hospital course:  The patient presented in active labor with scheduled cesarean section for the next morning.  She presented at 4-5cm dilation and was taken to the operating room for a repeat cesarean delivery, which occurred without incident.  Her postpartum/postoperative course was uncomplicated.   Physical exam  Vitals:   03/10/16 1958 03/10/16 2349 03/11/16 0259 03/11/16 0756  BP: 116/61   123/60  Pulse: 82   80  Resp: 18   16  Temp: 98.7 F (37.1 C) 98.6 F (37 C) 98.3 F (36.8 C) 98.2 F (36.8 C)  TempSrc: Oral Oral Oral Oral  SpO2: 98%   98%  Weight:      Height:       General: alert and no distress Lochia: appropriate Uterine Fundus: firm Incision: Healing well with no significant drainage DVT Evaluation: No evidence of DVT seen on physical exam.  Labs: Lab Results  Component Value Date   WBC 11.8 (H) 03/08/2016   HGB 9.4 (L) 03/08/2016   HCT 27.7 (L) 03/08/2016   MCV 84.5 03/08/2016   PLT 203 03/08/2016    Discharge instruction: per After Visit Summary.  Medications:    Medication List    TAKE these medications   ferrous sulfate 325 (65 FE) MG tablet Take 1 tablet (325 mg total) by mouth daily with breakfast.   ibuprofen 600 MG tablet Commonly known as:  ADVIL,MOTRIN Take 1 tablet (600 mg total) by mouth every 6 (six) hours.   oxyCODONE-acetaminophen 5-325 MG  tablet Commonly known as:  PERCOCET/ROXICET Take 1 tablet by mouth every 4 (four) hours as needed for moderate pain (pain scale 4-7/10).       Diet: routine diet  Activity: Advance as tolerated. Pelvic rest for 6 weeks.   Outpatient follow up: Follow-up Information    Conard NovakJackson, Stephen D, MD In 1 week.   Specialty:  Obstetrics and Gynecology Why:  incision check Contact information: [address] [place] [address] [phone]             Postpartum contraception: IUD or nexplanon Rhogam Given postpartum: no Rubella vaccine given postpartum: yes Varicella vaccine given postpartum: no  Newborn Data:   Dan EuropeMcMillan, Boy Hopelynn [[id number]][[id number]]  Live born female  Birth Weight: 7 lb 7.6 oz (3390 g) APGAR: 7, 9   Dan EuropeMcMillan, Boy Winnifred [[id number]][[id number]]  Live born female  Birth Weight:   APGAR: ,    Baby Feeding: Bottle  Disposition:home with mother  SIGNED: Vena AustriaAndreas Tarry Blayney, MD

## 2016-03-07 NOTE — H&P (Signed)
History and Physical Interval Note:  Laura Lucero  has presented today for surgery, with the diagnosis of labor with history of previous cesarean section.  The various methods of treatment have been discussed with the patient and family. After consideration of risks, benefits and other options for treatment, the patient has consented to  Repeat cesarean delivery as a surgical intervention .  The patient's history has been reviewed, patient examined, no change in status, stable for surgery.  I have reviewed the patient's chart and labs.  Questions were answered to the patient's satisfaction.     Conard NovakJackson, Shreyas Piatkowski D, MD 03/07/2016 9:34 PM

## 2016-03-07 NOTE — Op Note (Signed)
Cesarean Section Operative Note    Laura Lucero   03/07/2016   Pre-operative Diagnosis:  1) intrauterine pregnancy at [redacted]w[redacted]d  2) history of prior cesarean section, desires repeat 3) active labor   Post-operative Diagnosis:  1) intrauterine pregnancy at [redacted]w[redacted]d  2) history of prior cesarean section, desires repeat 3) active labor  Procedure: repeat low transverse cesarean section via pfannenstiel incision with double-layer uterine closure  Surgeon: Surgeon(s) and Role:    * Conard Novak, MD - Primary   Assistants: Tresea Mall, CNM  Anesthesia: spinal   Findings:  1) normal appearing gravid uterus, fallopian tubes, and ovaries 2) viable female infant with APGARs of 8 at 1 minute and 9 at 5 minutes   Estimated Blood Loss: 750 mL  Total IV Fluids: 1,051ml   Urine Output: 75 mL clear urine at end of procedure  Specimens: None  Complications: no complications  Disposition: PACU - hemodynamically stable.   Maternal Condition: stable   Baby condition / location:  Couplet care / Skin to Skin  Procedure Details:  The patient was seen in the Holding Room. The risks, benefits, complications, treatment options, and expected outcomes were discussed with the patient. The patient concurred with the proposed plan, giving informed consent. identified as Laura Lucero and the procedure verified as C-Section Delivery. A Time Out was held and the above information confirmed.   After induction of anesthesia, the patient was draped and prepped in the usual sterile manner. A Pfannenstiel incision was made and carried down through the subcutaneous tissue to the fascia. Fascial incision was made and extended transversely. The fascia was separated from the underlying rectus tissue superiorly and inferiorly. The peritoneum was identified and entered. Peritoneal incision was extended longitudinally. The bladder flap was sharply freed from the lower uterine segment. A low transverse uterine  incision was made and the hysterotomy was extended with cranial-caudal tension. Delivered from cephalic presentation was a 3,390 gram Living newborn infant(s) or Female with Apgar scores of 8 at one minute and 9 at five minutes. Cord ph was not sent the umbilical cord was clamped and cut cord blood was not obtained for evaluation. The placenta was removed Intact and appeared normal. The uterine outline, tubes and ovaries appeared normal. The uterine incision was closed with running locked sutures of 0 Vicryl.  A second layer of the same suture was thrown in an imbricating fashion.  Hemostasis was assured.  The uterus was returned to the abdomen and the paracolic gutters were cleared of all clots and debris.  The rectus muscles were inspected and found to be hemostatic.  The On-Q catheter pumps were inserted in accordance with the manufacturer's recommendations.  The catheters were inserted approximately 4cm cephelad to the incision line, approximately 1cm apart, straddling the midline.  They were inserted to a depth of the 4th mark. They were positioned superficial to the rectus abdominus muscles and deep to the rectus fascia.    The fascia was then reapproximated with running sutures of 1-0 PDS, looped. The subcutaneous tissue was reapproximated using 3-0 vicryl with 3 interrupted vertical stitches to close dead space and reinforce the skin closure.  The subcuticular closure was performed using 4-0 monocryl. The skin closure was reinforced using surgical skin glue.  The On-Q catheters were bolused with 5 mL of 0.5% marcaine plain for a total of 10 mL.  The catheters were affixed to the skin with surgical skin glue, steri-strips, and tegaderm.    Instrument, sponge, and needle counts were  correct prior the abdominal closure and were correct at the conclusion of the case.  The patient received clindamycin 900 mg and gentamicin 540 mg IV prior to skin incision (within 30 minutes). For VTE prophylaxis she was  wearing SCDs throughout the case.   Signed: Conard NovakStephen D. Clio Gerhart, MD 03/07/2016 11:25 PM

## 2016-03-07 NOTE — Anesthesia Procedure Notes (Signed)
Spinal  Patient location during procedure: OB Start time: 03/07/2016 10:00 PM End time: 03/07/2016 10:10 PM Staffing Anesthesiologist: Averyanna Sax Preanesthetic Checklist Completed: patient identified, site marked, surgical consent, pre-op evaluation, timeout performed, IV checked, risks and benefits discussed and monitors and equipment checked Spinal Block Patient position: sitting Prep: ChloraPrep Patient monitoring: heart rate, continuous pulse ox, blood pressure and cardiac monitor Approach: midline Location: L4-5 Injection technique: single-shot Needle Needle type: Whitacre and Introducer  Needle gauge: 24 G Needle length: 9 cm Assessment Sensory level: T3 Additional Notes Negative paresthesia. Negative blood return. Positive free-flowing CSF. Expiration date of kit checked and confirmed. Patient tolerated procedure well, without complications.

## 2016-03-07 NOTE — Discharge Summary (Signed)
Physician Discharge Summary  Patient ID: Laura Lucero MRN: [id number] DOB/AGE: 24/07/1990 24 y.o.  Admit date: 03/07/2016 Discharge date: 03/07/2016  Admission Diagnoses: G3P2002 with IUP at 39 weeks by U/S with complaint of Q 4min Contractions starting early this am. Pt admits positive fetal movement. Pt denies LOF/VB. Pt has scheduled repeat C/Section tomorrow.  Discharge Diagnoses:  Active Problems:   Labor and delivery indication for care or intervention IUP with Reactive NST, no labor  Discharged Condition: good  Hospital Course: Pt was admitted for observation and placed on monitors  Consults: None  Significant Diagnostic Studies: None  Treatments: None  Discharge Exam: Blood pressure 125/68, pulse 81, temperature 98.3 F (36.8 C), temperature source Oral, resp. rate 20. General appearance: alert, cooperative, appears stated age and no distress Resp: clear to auscultation bilaterally Cardio: regular rate and rhythm GI: gravid, mild to palpation Cervix: 1.5 cm/60% effaced/-2 station on admission and without change at discharge  Toco: q 6 minutes on admission with decreasing frequency at discharge Fetal Well Being: 130 bpm baseline, moderate variability, + accelerations, - decelerations Category I tracing  Disposition: 01-Home or Self Care  Discharge Instructions    Discharge activity:    Complete by:  As directed   Pre op instructions as given   Discharge activity:  No Restrictions    Complete by:  As directed   Discharge diet:  No restrictions    Complete by:  As directed   Nothing by mouth after midnight    [facility] am for scheduled [facility] section   Medication List    STOP taking these medications   cetirizine 5 MG tablet Commonly known as:  ZYRTEC   fluticasone 50 MCG/ACT nasal spray Commonly known as:  FLONASE   ibuprofen 800 MG tablet Commonly known as:  ADVIL,MOTRIN   ondansetron 4 MG disintegrating tablet Commonly known as:  ZOFRAN  ODT   traMADol 50 MG tablet Commonly known as:  Janean SarkULTRAM        SignedTresea Mall: Alyxandra Tenbrink, CNM 03/07/2016, 12:57 PM

## 2016-03-07 NOTE — OB Triage Note (Signed)
[name] here after check in at PAT for scheduled c/s tomorrow. Reports contractions since 3 AM, denies bleeding, LOF, reports positive fetal movement

## 2016-03-07 NOTE — Anesthesia Preprocedure Evaluation (Signed)
Anesthesia Evaluation  Patient identified by MRN, date of birth, ID band Patient awake    Reviewed: Allergy & Precautions, NPO status , Patient's Chart, lab work & pertinent test results  History of Anesthesia Complications Negative for: history of anesthetic complications  Airway Mallampati: III  TM Distance: >3 FB Neck ROM: Full    Dental no notable dental hx.    Pulmonary neg pulmonary ROS, neg sleep apnea, neg COPD,    breath sounds clear to auscultation- rhonchi (-) wheezing      Cardiovascular Exercise Tolerance: Good (-) hypertension(-) CAD and (-) Past MI  Rhythm:Regular Rate:Normal - Systolic murmurs and - Diastolic murmurs    Neuro/Psych negative neurological ROS  negative psych ROS   GI/Hepatic negative GI ROS, Neg liver ROS,   Endo/Other  negative endocrine ROSneg diabetes  Renal/GU negative Renal ROS     Musculoskeletal negative musculoskeletal ROS (+)   Abdominal (+) + obese, Gravid abdomen   Peds  Hematology  (+) anemia ,   Anesthesia Other Findings [id number] presenting in labor for repeat csection, hx of one prior csection for fetal distress after maternal reaction to penicillin done under epidural anesthesia  Reproductive/Obstetrics (+) Pregnancy                             Anesthesia Physical Anesthesia Plan  ASA: II and emergent  Anesthesia Plan: Spinal   Post-op Pain Management:    Induction:   Airway Management Planned:   Additional Equipment:   Intra-op Plan:   Post-operative Plan:   Informed Consent: I have reviewed the patients History and Physical, chart, labs and discussed the procedure including the risks, benefits and alternatives for the proposed anesthesia with the patient or authorized representative who has indicated his/her understanding and acceptance.     Plan Discussed with: Anesthesiologist and CRNA  Anesthesia Plan Comments:          Lab Results  Component Value Date   WBC 8.9 03/07/2016   HGB 9.9 (L) 03/07/2016   HCT 29.3 (L) 03/07/2016   MCV 83.5 03/07/2016   PLT 244 03/07/2016    Anesthesia Quick Evaluation

## 2016-03-08 LAB — OB RESULTS CONSOLE HIV ANTIBODY (ROUTINE TESTING)
HIV: NONREACTIVE
HIV: NONREACTIVE

## 2016-03-08 LAB — CBC
HCT: 27.7 % — ABNORMAL LOW (ref 35.0–47.0)
HEMATOCRIT: 29.9 % — AB (ref 35.0–47.0)
HEMOGLOBIN: 10 g/dL — AB (ref 12.0–16.0)
Hemoglobin: 9.4 g/dL — ABNORMAL LOW (ref 12.0–16.0)
MCH: 28.3 pg (ref 26.0–34.0)
MCH: 28.6 pg (ref 26.0–34.0)
MCHC: 33.5 g/dL (ref 32.0–36.0)
MCHC: 33.8 g/dL (ref 32.0–36.0)
MCV: 84.5 fL (ref 80.0–100.0)
MCV: 84.5 fL (ref 80.0–100.0)
PLATELETS: 220 10*3/uL (ref 150–440)
PLATELETS: 203 10*3/uL (ref 150–440)
RBC: 3.54 MIL/uL — ABNORMAL LOW (ref 3.80–5.20)
RBC: 3.28 MIL/uL — ABNORMAL LOW (ref 3.80–5.20)
RDW: 14.2 % (ref 11.5–14.5)
RDW: 14.5 % (ref 11.5–14.5)
WBC: 13.8 10*3/uL — ABNORMAL HIGH (ref 3.6–11.0)
WBC: 11.8 10*3/uL — ABNORMAL HIGH (ref 3.6–11.0)

## 2016-03-08 LAB — OB RESULTS CONSOLE VARICELLA ZOSTER ANTIBODY, IGG: VARICELLA IGG: IMMUNE

## 2016-03-08 LAB — OB RESULTS CONSOLE RPR
RPR: NONREACTIVE
RPR: NONREACTIVE

## 2016-03-08 LAB — RPR: RPR: NONREACTIVE

## 2016-03-08 LAB — OB RESULTS CONSOLE HEPATITIS B SURFACE ANTIGEN: HEP B S AG: NEGATIVE

## 2016-03-08 LAB — OB RESULTS CONSOLE RUBELLA ANTIBODY, IGM: RUBELLA: NON-IMMUNE/NOT IMMUNE

## 2016-03-08 MED ORDER — DIPHENHYDRAMINE HCL 25 MG PO CAPS
25.0000 mg | ORAL_CAPSULE | ORAL | Status: DC | PRN
  Administered 2016-03-08: 25 mg via ORAL

## 2016-03-08 MED ORDER — NALBUPHINE HCL 10 MG/ML IJ SOLN: 5.0000 mg | Freq: Once | INTRAMUSCULAR | Status: DC | PRN

## 2016-03-08 MED ORDER — IBUPROFEN 600 MG PO TABS
600.0000 mg | ORAL_TABLET | Freq: Four times a day (QID) | ORAL | Status: DC
  Administered 2016-03-08 – 2016-03-09 (×2): 600 mg via ORAL
  Filled 2016-03-08 (×2): qty 1

## 2016-03-08 MED ORDER — MENTHOL 3 MG MT LOZG
1.0000 | LOZENGE | OROMUCOSAL | Status: DC | PRN
  Filled 2016-03-08: qty 9

## 2016-03-08 MED ORDER — COCONUT OIL OIL: 1.0000 "application " | TOPICAL_OIL | Status: DC | PRN

## 2016-03-08 MED ORDER — SCOPOLAMINE 1 MG/3DAYS TD PT72: 1.0000 | MEDICATED_PATCH | Freq: Once | TRANSDERMAL | Status: DC

## 2016-03-08 MED ORDER — DOCUSATE SODIUM 100 MG PO CAPS
100.0000 mg | ORAL_CAPSULE | Freq: Every day | ORAL | Status: DC
  Administered 2016-03-08 – 2016-03-11 (×4): 100 mg via ORAL
  Filled 2016-03-08 (×4): qty 1

## 2016-03-08 MED ORDER — MEPERIDINE HCL 25 MG/ML IJ SOLN: 6.2500 mg | INTRAMUSCULAR | Status: DC | PRN

## 2016-03-08 MED ORDER — LACTATED RINGERS IV SOLN
INTRAVENOUS | Status: DC
  Administered 2016-03-08: 15:00:00 via INTRAVENOUS

## 2016-03-08 MED ORDER — DIPHENHYDRAMINE HCL 25 MG PO CAPS
25.0000 mg | ORAL_CAPSULE | Freq: Four times a day (QID) | ORAL | Status: DC | PRN
  Filled 2016-03-08: qty 1

## 2016-03-08 MED ORDER — BUPIVACAINE ON-Q PAIN PUMP (FOR ORDER SET NO CHG): INJECTION | Status: DC

## 2016-03-08 MED ORDER — OXYCODONE HCL 5 MG PO TABS
5.0000 mg | ORAL_TABLET | ORAL | Status: DC | PRN
  Administered 2016-03-08 (×2): 5 mg via ORAL
  Filled 2016-03-08 (×2): qty 1

## 2016-03-08 MED ORDER — FERROUS SULFATE 325 (65 FE) MG PO TABS
325.0000 mg | ORAL_TABLET | Freq: Two times a day (BID) | ORAL | Status: DC
  Administered 2016-03-08 – 2016-03-11 (×7): 325 mg via ORAL
  Filled 2016-03-08 (×7): qty 1

## 2016-03-08 MED ORDER — OXYCODONE-ACETAMINOPHEN 5-325 MG PO TABS
1.0000 | ORAL_TABLET | ORAL | Status: DC | PRN
  Administered 2016-03-09 – 2016-03-10 (×5): 1 via ORAL
  Filled 2016-03-08 (×5): qty 1

## 2016-03-08 MED ORDER — DIBUCAINE 1 % RE OINT: 1.0000 "application " | TOPICAL_OINTMENT | RECTAL | Status: DC | PRN

## 2016-03-08 MED ORDER — SIMETHICONE 80 MG PO CHEW
80.0000 mg | CHEWABLE_TABLET | Freq: Three times a day (TID) | ORAL | Status: DC
  Administered 2016-03-08 – 2016-03-11 (×11): 80 mg via ORAL
  Filled 2016-03-08 (×11): qty 1

## 2016-03-08 MED ORDER — ACETAMINOPHEN 325 MG PO TABS
ORAL_TABLET | ORAL | Status: AC
  Filled 2016-03-08: qty 2

## 2016-03-08 MED ORDER — ONDANSETRON HCL 4 MG/2ML IJ SOLN: 4.0000 mg | Freq: Three times a day (TID) | INTRAMUSCULAR | Status: DC | PRN

## 2016-03-08 MED ORDER — OXYCODONE-ACETAMINOPHEN 5-325 MG PO TABS
2.0000 | ORAL_TABLET | ORAL | Status: DC | PRN
  Administered 2016-03-09: 2 via ORAL
  Filled 2016-03-08 (×2): qty 2

## 2016-03-08 MED ORDER — DIPHENHYDRAMINE HCL 50 MG/ML IJ SOLN: 12.5000 mg | INTRAMUSCULAR | Status: DC | PRN

## 2016-03-08 MED ORDER — OXYTOCIN 40 UNITS IN LACTATED RINGERS INFUSION - SIMPLE MED: 2.5000 [IU]/h | INTRAVENOUS | Status: AC

## 2016-03-08 MED ORDER — SENNOSIDES-DOCUSATE SODIUM 8.6-50 MG PO TABS
2.0000 | ORAL_TABLET | ORAL | Status: DC
  Administered 2016-03-08 – 2016-03-11 (×4): 2 via ORAL
  Filled 2016-03-08 (×4): qty 2

## 2016-03-08 MED ORDER — PRENATAL MULTIVITAMIN CH
1.0000 | ORAL_TABLET | Freq: Every day | ORAL | Status: DC
  Administered 2016-03-08 – 2016-03-11 (×4): 1 via ORAL
  Filled 2016-03-08 (×4): qty 1

## 2016-03-08 MED ORDER — NALOXONE HCL 0.4 MG/ML IJ SOLN: 0.4000 mg | INTRAMUSCULAR | Status: DC | PRN

## 2016-03-08 MED ORDER — WITCH HAZEL-GLYCERIN EX PADS: 1.0000 "application " | MEDICATED_PAD | CUTANEOUS | Status: DC | PRN

## 2016-03-08 MED ORDER — NALOXONE HCL 2 MG/2ML IJ SOSY: 1.0000 ug/kg/h | PREFILLED_SYRINGE | INTRAVENOUS | Status: DC | PRN

## 2016-03-08 MED ORDER — SODIUM CHLORIDE 0.9% FLUSH: 3.0000 mL | INTRAVENOUS | Status: DC | PRN

## 2016-03-08 NOTE — Progress Notes (Signed)
POD #1 (12 hours p0) Subjective:   Hungry. Tolerating clear liquids.  Bottle feeding.   Objective:  Blood pressure 115/67, pulse 75, temperature 98.3 F (36.8 C), temperature source Oral, resp. rate 20, height 5\' 3"  (1.6 m), weight 110.7 kg (244 lb), SpO2 99 %, unknown if currently breastfeeding.  General: NAD, talking on the phone Heart: RRR without murmur Pulmonary: no increased work of breathing/ CTAB Abdomen: softly distended, non-tender, fundus firm at level of umbilicus Incision: honeycomb dressing C+D+I, ON Q intact Extremities: SCDs on  Results for orders placed or performed during the hospital encounter of 03/07/16 (from the past 72 hour(s))  OB RESULTS CONSOLE RPR     Status: None   Collection Time: 03/08/16 12:00 AM  Result Value Ref Range   RPR Nonreactive   OB RESULTS CONSOLE HIV antibody     Status: None   Collection Time: 03/08/16 12:00 AM  Result Value Ref Range   HIV Non-reactive   OB RESULTS CONSOLE Rubella Antibody     Status: None   Collection Time: 03/08/16 12:00 AM  Result Value Ref Range   Rubella Nonimmune   OB RESULTS CONSOLE Varicella zoster antibody, IgG     Status: None   Collection Time: 03/08/16 12:00 AM  Result Value Ref Range   Varicella Immune   OB RESULTS CONSOLE Hepatitis B surface antigen     Status: None   Collection Time: 03/08/16 12:00 AM  Result Value Ref Range   Hepatitis B Surface Ag Negative   OB RESULTS CONSOLE RPR     Status: None   Collection Time: 03/08/16 12:00 AM  Result Value Ref Range   RPR Nonreactive   OB RESULTS CONSOLE HIV antibody     Status: None   Collection Time: 03/08/16 12:00 AM  Result Value Ref Range   HIV Non-reactive   CBC     Status: Abnormal   Collection Time: 03/08/16  1:37 AM  Result Value Ref Range   WBC 13.8 (H) 3.6 - 11.0 K/uL   RBC 3.54 (L) 3.80 - 5.20 MIL/uL   Hemoglobin 10.0 (L) 12.0 - 16.0 g/dL   HCT 95.629.9 (L) 21.335.0 - 08.647.0 %   MCV 84.5 80.0 - 100.0 fL   MCH 28.3 26.0 - 34.0 pg   MCHC 33.5  32.0 - 36.0 g/dL   RDW 57.814.2 46.911.5 - 62.914.5 %   Platelets 220 150 - 440 K/uL  CBC     Status: Abnormal   Collection Time: 03/08/16  4:55 AM  Result Value Ref Range   WBC 11.8 (H) 3.6 - 11.0 K/uL   RBC 3.28 (L) 3.80 - 5.20 MIL/uL   Hemoglobin 9.4 (L) 12.0 - 16.0 g/dL   HCT 52.827.7 (L) 41.335.0 - 24.447.0 %   MCV 84.5 80.0 - 100.0 fL   MCH 28.6 26.0 - 34.0 pg   MCHC 33.8 32.0 - 36.0 g/dL   RDW 01.014.5 27.211.5 - 53.614.5 %   Platelets 203 150 - 440 K/uL     Assessment:   25 y.o. G3P1003 postoperativeday # 1 stable  DC foley later today, then trial of voiding  Advance diet as tolerated.   Plan:  1) Chronic anemia worsened by  acute blood loss - hemodynamically stable and asymptomatic - po ferrous sulfate/ vitamins  2) --/--/A POS (08/28 [id number]) Ishmael Holter/Rubella Nonimmune (08/29 0000) / Varicella Immune  3) TDAP given 01/15/2016  4) Bottle/Contraception (?IUD)  5) Disposition

## 2016-03-08 NOTE — Anesthesia Postprocedure Evaluation (Signed)
Anesthesia Post Note  Patient: Laura Lucero  Procedure(s) Performed: Procedure(s) (LRB): CESAREAN SECTION (N/A)  Patient location during evaluation: PACU Anesthesia Type: Spinal Level of consciousness: oriented and awake and alert Pain management: pain level controlled Vital Signs Assessment: post-procedure vital signs reviewed and stable Respiratory status: spontaneous breathing, respiratory function stable, patient connected to nasal cannula oxygen and nonlabored ventilation Cardiovascular status: blood pressure returned to baseline and stable Postop Assessment: no headache, no backache and spinal receding Anesthetic complications: no    Last Vitals:  Vitals:   03/07/16 2355 03/08/16 0000  BP: 113/71   Pulse: (!) 56 (!) 52  Resp: 18 18  Temp:      Last Pain:  Vitals:   03/07/16 2106  TempSrc:   PainSc: 10-Worst pain ever                 Kacy Hegna

## 2016-03-09 MED ORDER — MEASLES, MUMPS & RUBELLA VAC ~~LOC~~ INJ
0.5000 mL | INJECTION | Freq: Once | SUBCUTANEOUS | Status: AC
  Administered 2016-03-11: 0.5 mL via SUBCUTANEOUS
  Filled 2016-03-09 (×2): qty 0.5

## 2016-03-09 MED ORDER — IBUPROFEN 600 MG PO TABS
600.0000 mg | ORAL_TABLET | Freq: Four times a day (QID) | ORAL | Status: DC
  Administered 2016-03-09 – 2016-03-11 (×9): 600 mg via ORAL
  Filled 2016-03-09 (×10): qty 1

## 2016-03-09 NOTE — Progress Notes (Signed)
  Postpartum Day 2  Subjective: up ad lib, voiding, tolerating PO, + flatus and c/o losing her voice  Objective: Blood pressure 109/62, pulse 73, temperature 98 F (36.7 C), temperature source Oral, resp. rate 18, height '5\' 3"'$  (1.6 m), weight 244 lb (110.7 kg), SpO2 98 %, unknown if currently breastfeeding.  Physical Exam:  General: alert and cooperative Lochia: appropriate Uterine Fundus: firm Incision: healing well, no significant drainage DVT Evaluation: No evidence of DVT seen on physical exam. Abdomen: mildly distended w/ gas   Recent Labs  03/08/16 0137 03/08/16 0455  HGB 10.0* 9.4*  HCT 29.9* 27.7*    Assessment POD #2, chronic iron-deficiency and acute blood loss anemia  Plan: Continue PO care, Advance activity as tolerated and Fe replacement, anemia precautions  Feeding: bottle Contraception: IUD - will need to order at 1 wk visit Blood Type: A+ RNI/VI - MMR prior to discharge TDAP UTD    Burlene Arnt, CNM 03/09/2016, 10:03 AM

## 2016-03-09 NOTE — Progress Notes (Signed)
Pt called out around 0530; another RN went to check on pt; that RN reported back; pt had baby laying flat on her lap with the bottle in the baby's mouth feeding baby formula; RN explained that it is safer to hold baby upright while feeding; pt said, "i'm just too tired, can you just feed my baby?"; RN offered to assist pt get situated with baby and bottle; pt declined and asked RN to put baby in [place]
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
## 2016-03-10 MED ORDER — SULFAMETHOXAZOLE-TRIMETHOPRIM 800-160 MG PO TABS
1.0000 | ORAL_TABLET | Freq: Two times a day (BID) | ORAL | Status: DC
  Administered 2016-03-10 – 2016-03-11 (×2): 1 via ORAL
  Filled 2016-03-10 (×2): qty 1

## 2016-03-10 NOTE — Progress Notes (Addendum)
POD 2 1/2 s/p repeat CS Subjective:    States is not yet passing gas. Tolerating regular diet otherwise. Ambulated twice in hallway yesterday.   Objective:  Blood pressure 107/64, pulse 78, temperature 98.5 F (36.9 C), temperature source Oral, resp. rate 18, height 5\' 3"  (1.6 m), weight 110.7 kg (244 lb), SpO2 99 %, unknown if currently breastfeeding.  General: NAD/ sleepy Heart: RRR without murmur Pulmonary: no increased work of breathing/ CTAB Abdomen: non-distended, non-tender, fundus firm at level of umbilicus-3FB, BS active Incision: honey comb dressing C&D&I, ON Q intact Extremities:  no erythema, no tenderness  Results for orders placed or performed during the hospital encounter of 03/07/16 (from the past 72 hour(s))  OB RESULTS CONSOLE RPR     Status: None   Collection Time: 03/08/16 12:00 AM  Result Value Ref Range   RPR Nonreactive   OB RESULTS CONSOLE HIV antibody     Status: None   Collection Time: 03/08/16 12:00 AM  Result Value Ref Range   HIV Non-reactive   OB RESULTS CONSOLE Rubella Antibody     Status: None   Collection Time: 03/08/16 12:00 AM  Result Value Ref Range   Rubella Nonimmune   OB RESULTS CONSOLE Varicella zoster antibody, IgG     Status: None   Collection Time: 03/08/16 12:00 AM  Result Value Ref Range   Varicella Immune   OB RESULTS CONSOLE Hepatitis B surface antigen     Status: None   Collection Time: 03/08/16 12:00 AM  Result Value Ref Range   Hepatitis B Surface Ag Negative   OB RESULTS CONSOLE RPR     Status: None   Collection Time: 03/08/16 12:00 AM  Result Value Ref Range   RPR Nonreactive   OB RESULTS CONSOLE HIV antibody     Status: None   Collection Time: 03/08/16 12:00 AM  Result Value Ref Range   HIV Non-reactive   CBC     Status: Abnormal   Collection Time: 03/08/16  1:37 AM  Result Value Ref Range   WBC 13.8 (H) 3.6 - 11.0 K/uL   RBC 3.54 (L) 3.80 - 5.20 MIL/uL   Hemoglobin 10.0 (L) 12.0 - 16.0 g/dL   HCT 16.129.9 (L) 09.635.0 -  47.0 %   MCV 84.5 80.0 - 100.0 fL   MCH 28.3 26.0 - 34.0 pg   MCHC 33.5 32.0 - 36.0 g/dL   RDW 04.514.2 40.911.5 - 81.114.5 %   Platelets 220 150 - 440 K/uL  CBC     Status: Abnormal   Collection Time: 03/08/16  4:55 AM  Result Value Ref Range   WBC 11.8 (H) 3.6 - 11.0 K/uL   RBC 3.28 (L) 3.80 - 5.20 MIL/uL   Hemoglobin 9.4 (L) 12.0 - 16.0 g/dL   HCT 91.427.7 (L) 78.235.0 - 95.647.0 %   MCV 84.5 80.0 - 100.0 fL   MCH 28.6 26.0 - 34.0 pg   MCHC 33.8 32.0 - 36.0 g/dL   RDW 21.314.5 08.611.5 - 57.814.5 %   Platelets 203 150 - 440 K/uL     Assessment:   [age] y.o. G3P1003 postoperativeday # # (2 1/2)-stable, but not ready for discharge  Await improved bowel function-prune juice, ambulate, dulcolax supp if needed   Plan:  1) Acute blood loss anemia - hemodynamically stable and asymptomatic - po iron and vitamins  2) --/--/A POS (08/28 [id number]) Ishmael Holter/Rubella Nonimmune (08/29 0000) / Varicella Immune  3) TDAP UTD  4) Bottle  5) Contraception: IUD  6) Disposition-home  later today or tomorrow, wants ON Q removed at time of discharge  Catha Ontko, CNM  1700 Urine culture from 8/28 returned >100K  E coli. Will start on Bactrim DS BID Lovetta Condie, CNM

## 2016-03-11 LAB — CHLAMYDIA/NGC RT PCR (ARMC ONLY)
Chlamydia Tr: NOT DETECTED
N gonorrhoeae: NOT DETECTED

## 2016-03-11 MED ORDER — OXYCODONE-ACETAMINOPHEN 5-325 MG PO TABS: 1.0000 | ORAL_TABLET | ORAL | 0 refills | Status: DC | PRN

## 2016-03-11 MED ORDER — FERROUS SULFATE 325 (65 FE) MG PO TABS: 325.0000 mg | ORAL_TABLET | Freq: Every day | ORAL | 3 refills | Status: DC

## 2016-03-11 MED ORDER — IBUPROFEN 600 MG PO TABS: 600.0000 mg | ORAL_TABLET | Freq: Four times a day (QID) | ORAL | 0 refills | Status: DC

## 2016-03-11 NOTE — Progress Notes (Signed)
Patient discharge to home via wheelchair with baby in car seat. Discharge instructions reviewed with patient.  All questions answered.  Follow up appointment scheduled.

## 2016-03-11 NOTE — Progress Notes (Signed)
Per Dr.Steabler and pts chart. Gc/C negative on 02/12/16 at [facility]. Will obtain a urine sample for testing before discharge.

## 2016-09-02 ENCOUNTER — Encounter: Payer: Self-pay | Admitting: Emergency Medicine

## 2016-09-02 ENCOUNTER — Emergency Department
Admission: EM | Admit: 2016-09-02 | Discharge: 2016-09-02 | Disposition: A | Discharge status: Home / Self Care | Payer: Medicaid Other | Attending: Emergency Medicine | Admitting: Emergency Medicine

## 2016-09-02 DIAGNOSIS — R509 Fever, unspecified: Secondary | ICD-10-CM | POA: Diagnosis present

## 2016-09-02 DIAGNOSIS — B349 Viral infection, unspecified: Secondary | ICD-10-CM | POA: Diagnosis not present

## 2016-09-02 DIAGNOSIS — Z79899 Other long term (current) drug therapy: Secondary | ICD-10-CM | POA: Insufficient documentation

## 2016-09-02 DIAGNOSIS — Z791 Long term (current) use of non-steroidal anti-inflammatories (NSAID): Secondary | ICD-10-CM | POA: Insufficient documentation

## 2016-09-02 NOTE — ED Triage Notes (Addendum)
Patient ambulatory to triage with steady gait, without difficulty or distress noted; pt reports fever & nasal congestion since yesterday; mom here with child also to be seen with similar symptoms

## 2016-09-02 NOTE — ED Provider Notes (Signed)
[facility] Emergency Department Provider Note  ____________________________________________  Time seen: Approximately 7:10 AM  I have reviewed the triage vital signs and the nursing notes.   HISTORY  Chief Complaint Fever   HPI Laura Lucero is a 26 y.o. female who presents to the ER for evaluation of fever and nasal congestion that started yesterday.She is also complaining of chills, but does not believe she has had a fever. She is also here with her son who has similar symptoms.   Past Medical History:  Diagnosis Date  . Anemia     Patient Active Problem List   Diagnosis Date Noted  . Labor and delivery indication for care or intervention 03/07/2016  . Supervision of high-risk pregnancy 03/07/2016  . History of cesarean delivery 03/07/2016  . [redacted] weeks gestation of pregnancy 03/07/2016    Past Surgical History:  Procedure Laterality Date  . CESAREAN SECTION  2015  . CESAREAN SECTION N/A 03/07/2016   Procedure: CESAREAN SECTION;  Surgeon: Conard Novak, MD;  Location: [facility] ORS;  Service: Obstetrics;  Laterality: N/A;  . CYST REMOVAL NECK      Prior to Admission medications   Medication Sig Start Date End Date Taking? Authorizing Provider  ferrous sulfate 325 (65 FE) MG tablet Take 1 tablet (325 mg total) by mouth daily with breakfast. 03/11/16   Vena Austria, MD  ibuprofen (ADVIL,MOTRIN) 600 MG tablet Take 1 tablet (600 mg total) by mouth every 6 (six) hours. 03/11/16   Vena Austria, MD  oxyCODONE-acetaminophen (PERCOCET/ROXICET) 5-325 MG tablet Take 1 tablet by mouth every 4 (four) hours as needed for moderate pain (pain scale 4-7/10). 03/11/16   Vena Austria, MD    Allergies Ampicillin  No family history on file.  Social History Social History  Substance Use Topics  . Smoking status: Never Smoker  . Smokeless tobacco: Never Used  . Alcohol use No    Review of Systems Constitutional: Negative for fever/chills ENT:  Negative for sore throat. Cardiovascular: Denies chest pain. Respiratory: Negative for shortness of breath. positive for cough. Gastrointestinal: Negative for nausea,  no vomiting.  No diarrhea.  Musculoskeletal: Positive for body aches Skin: Negative for rash. Neurological: Negative for headaches ____________________________________________   PHYSICAL EXAM:  VITAL SIGNS: ED Triage Vitals  Enc Vitals Group     BP 09/02/16 0621 117/68     Pulse Rate 09/02/16 0621 67     Resp 09/02/16 0621 18     Temp 09/02/16 0621 98.5 F (36.9 C)     Temp Source 09/02/16 0621 Oral     SpO2 09/02/16 0621 96 %     Weight 09/02/16 0612 237 lb (107.5 kg)     Height 09/02/16 0612 5\' 3"  (1.6 m)     Head Circumference --      Peak Flow --      Pain Score --      Pain Loc --      Pain Edu? --      Excl. in GC? --     Constitutional: Alert and oriented. Well appearing and in no acute distress. Eyes: Conjunctivae are normal. EOMI. Ears: Bilateral TM normal Nose: Nasal congestion; without rhinnorhea. Mouth/Throat: Mucous membranes are moist.  Oropharynx normal. Tonsils not visualized. Neck: No stridor.  Lymphatic: No cervical lymphadenopathy. Cardiovascular: Normal rate, regular rhythm. Grossly normal heart sounds.  Good peripheral circulation. Respiratory: Normal respiratory effort.  No retractions. Breath sounds clear to auscultation. Gastrointestinal: Soft and nontender.  Musculoskeletal: FROM x 4 extremities.  Neurologic:  Normal speech and language.  Skin:  Skin is warm, dry and intact. No rash noted. Psychiatric: Mood and affect are normal. Speech and behavior are normal.  ____________________________________________   LABS (all labs ordered are listed, but only abnormal results are displayed)  Labs Reviewed - No data to display ____________________________________________  EKG   ____________________________________________  RADIOLOGY  Not  indicated. ____________________________________________   PROCEDURES  Procedure(s) performed: None  Critical Care performed: No  ____________________________________________   INITIAL IMPRESSION / ASSESSMENT AND PLAN / ED COURSE     Pertinent labs & imaging results that were available during my care of the patient were reviewed by me and considered in my medical decision making (see chart for details).   26 year old female presenting to the emergency department for evaluation of viral symptoms. She will be advised to take tylenol and/or ibuprofen for bodyaches and fever. She was instructed to follow up with the primary care provider for symptoms that are not improving over the week or return to the ER for symptoms that change or worsen and she is unable to schedule an appointment. ____________________________________________   FINAL CLINICAL IMPRESSION(S) / ED DIAGNOSES  Final diagnoses:  Viral illness    Note:  This document was prepared using Dragon voice recognition software and may include unintentional dictation errors.     Chinita PesterCari B Ransome Helwig, FNP 09/04/16 1746    Sharman CheekPhillip Stafford, MD 09/06/16 [phone]

## 2016-09-02 NOTE — ED Notes (Signed)
NAD noted at time of D/C. Pt denies questions or concerns. Pt ambulatory to the lobby at this time.  

## 2016-09-02 NOTE — ED Notes (Signed)
Pt reports fever and nasal congestion, c/o green mucous, x 2 days. Pt is unable to report max temp at home but reports that she has been having chills. Pt is A&O x4, respirations even and unlabored, skin warm, dry, and intact.

## 2016-12-07 ENCOUNTER — Encounter: Payer: Self-pay | Admitting: *Deleted

## 2016-12-07 ENCOUNTER — Emergency Department: Payer: Medicaid Other

## 2016-12-07 ENCOUNTER — Emergency Department
Admission: EM | Admit: 2016-12-07 | Discharge: 2016-12-08 | Disposition: A | Discharge status: Home / Self Care | Payer: Medicaid Other | Attending: Emergency Medicine | Admitting: Emergency Medicine

## 2016-12-07 DIAGNOSIS — M25531 Pain in right wrist: Secondary | ICD-10-CM | POA: Diagnosis not present

## 2016-12-07 DIAGNOSIS — O209 Hemorrhage in early pregnancy, unspecified: Secondary | ICD-10-CM

## 2016-12-07 DIAGNOSIS — O200 Threatened abortion: Secondary | ICD-10-CM | POA: Diagnosis not present

## 2016-12-07 LAB — BASIC METABOLIC PANEL
Anion gap: 8 (ref 5–15)
BUN: 11 mg/dL (ref 6–20)
CALCIUM: 9.4 mg/dL (ref 8.9–10.3)
CHLORIDE: 108 mmol/L (ref 101–111)
CO2: 23 mmol/L (ref 22–32)
Creatinine, Ser: 0.59 mg/dL (ref 0.44–1.00)
GFR calc non Af Amer: >60 mL/min (ref >60)
Glucose, Bld: 85 mg/dL (ref 65–99)
Potassium: 3.5 mmol/L (ref 3.5–5.1)
Sodium: 139 mmol/L (ref 135–145)

## 2016-12-07 LAB — CBC
HEMATOCRIT: 35.9 % (ref 35.0–47.0)
Hemoglobin: 12.1 g/dL (ref 12.0–16.0)
MCH: 29.7 pg (ref 26.0–34.0)
MCHC: 33.7 g/dL (ref 32.0–36.0)
MCV: 88 fL (ref 80.0–100.0)
Platelets: 267 10*3/uL (ref 150–440)
RBC: 4.08 MIL/uL (ref 3.80–5.20)
RDW: 13.9 % (ref 11.5–14.5)
WBC: 9.5 10*3/uL (ref 3.6–11.0)

## 2016-12-07 LAB — URINALYSIS, COMPLETE (UACMP) WITH MICROSCOPIC
BACTERIA UA: NONE SEEN
Bilirubin Urine: NEGATIVE
Glucose, UA: NEGATIVE mg/dL
Ketones, ur: NEGATIVE mg/dL
Leukocytes, UA: NEGATIVE
NITRITE: NEGATIVE
PROTEIN: NEGATIVE mg/dL
SPECIFIC GRAVITY, URINE: 1.028 (ref 1.005–1.030)
pH: 5 (ref 5.0–8.0)

## 2016-12-07 LAB — WET PREP, GENITAL
CLUE CELLS WET PREP: NONE SEEN
SPERM: NONE SEEN
TRICH WET PREP: NONE SEEN
YEAST WET PREP: NONE SEEN

## 2016-12-07 LAB — POCT PREGNANCY, URINE: Preg Test, Ur: POSITIVE — AB

## 2016-12-07 MED ORDER — ACETAMINOPHEN 325 MG PO TABS
650.0000 mg | ORAL_TABLET | Freq: Once | ORAL | Status: AC
  Administered 2016-12-07: 650 mg via ORAL
  Filled 2016-12-07: qty 2

## 2016-12-07 NOTE — ED Notes (Signed)
Patient transported to Ultrasound 

## 2016-12-07 NOTE — ED Triage Notes (Signed)
Pt has right wrist pain.  No known injury.  Pt also brown discharge and had 2 positive home pregnancy test.  No urinary sx.
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
## 2016-12-08 LAB — CHLAMYDIA/NGC RT PCR (ARMC ONLY)
Chlamydia Tr: NOT DETECTED
N gonorrhoeae: NOT DETECTED

## 2016-12-08 LAB — ABO/RH: ABO/RH(D): A POS

## 2016-12-08 LAB — HCG, QUANTITATIVE, PREGNANCY: HCG, BETA CHAIN, QUANT, S: 5290 m[IU]/mL — AB (ref <5)

## 2016-12-08 NOTE — ED Notes (Signed)

## 2016-12-08 NOTE — ED Provider Notes (Signed)
-----------------------------------------   1:12 AM on 12/08/2016 -----------------------------------------  Wet prep negative. A+ blood type.  Ultrasound interpreted per Dr. Cherly Hensenhang: Single intrauterine gestational sac noted, mildly irregular in  appearance. No yolk sac or embryo is yet seen. The gestational sac  has a mean sac diameter of 7 mm, corresponding to a gestational age  of [redacted] weeks 3 days. This does not match the gestational age by LMP,  though it remains too early to determine an estimated date of  delivery. Would perform follow-up pelvic ultrasound in 2 weeks for  further evaluation, if the patient's quantitative beta HCG continues  to rise.   Updated patient of laboratory and imaging results. Strict return precautions given. [facility]. Patient verbalizes understanding and agrees with plan of care.   Irean HongSung, Jade J, MD 12/08/16 [phone]

## 2016-12-08 NOTE — Discharge Instructions (Signed)
1. Drink plenty of fluids daily. °2. Pelvic rest until seen by your doctor - no sexual intercourse, douche, tampons. °3. Return to the ER for worsening symptoms, persistent vomiting, fainting, soaking more than 1 pad per hour or other concerns.  °

## 2016-12-09 ENCOUNTER — Encounter: Payer: Self-pay | Admitting: Obstetrics & Gynecology

## 2016-12-15 ENCOUNTER — Encounter: Payer: Self-pay | Admitting: Obstetrics and Gynecology

## 2017-03-26 ENCOUNTER — Emergency Department: Payer: No Typology Code available for payment source

## 2017-03-26 ENCOUNTER — Emergency Department
Admission: EM | Admit: 2017-03-26 | Discharge: 2017-03-26 | Disposition: A | Discharge status: Home / Self Care | Payer: No Typology Code available for payment source | Attending: Emergency Medicine | Admitting: Emergency Medicine

## 2017-03-26 ENCOUNTER — Encounter: Payer: Self-pay | Admitting: *Deleted

## 2017-03-26 DIAGNOSIS — T23001A Burn of unspecified degree of right hand, unspecified site, initial encounter: Secondary | ICD-10-CM | POA: Insufficient documentation

## 2017-03-26 DIAGNOSIS — Z79899 Other long term (current) drug therapy: Secondary | ICD-10-CM | POA: Insufficient documentation

## 2017-03-26 DIAGNOSIS — W2210XA Striking against or struck by unspecified automobile airbag, initial encounter: Secondary | ICD-10-CM | POA: Insufficient documentation

## 2017-03-26 DIAGNOSIS — Y929 Unspecified place or not applicable: Secondary | ICD-10-CM | POA: Insufficient documentation

## 2017-03-26 DIAGNOSIS — Y998 Other external cause status: Secondary | ICD-10-CM | POA: Insufficient documentation

## 2017-03-26 DIAGNOSIS — Y9389 Activity, other specified: Secondary | ICD-10-CM | POA: Diagnosis not present

## 2017-03-26 DIAGNOSIS — Z23 Encounter for immunization: Secondary | ICD-10-CM | POA: Insufficient documentation

## 2017-03-26 DIAGNOSIS — M79641 Pain in right hand: Secondary | ICD-10-CM | POA: Diagnosis present

## 2017-03-26 DIAGNOSIS — X04XXXA Exposure to ignition of highly flammable material, initial encounter: Secondary | ICD-10-CM | POA: Diagnosis not present

## 2017-03-26 DIAGNOSIS — T300 Burn of unspecified body region, unspecified degree: Secondary | ICD-10-CM

## 2017-03-26 MED ORDER — TETANUS-DIPHTH-ACELL PERTUSSIS 5-2.5-18.5 LF-MCG/0.5 IM SUSP
0.5000 mL | Freq: Once | INTRAMUSCULAR | Status: AC
  Administered 2017-03-26: 0.5 mL via INTRAMUSCULAR
  Filled 2017-03-26: qty 0.5

## 2017-03-26 MED ORDER — SILVER SULFADIAZINE 1 % EX CREA
TOPICAL_CREAM | Freq: Once | CUTANEOUS | Status: AC
  Administered 2017-03-26: 15:00:00 via TOPICAL

## 2017-03-26 MED ORDER — SILVER SULFADIAZINE 1 % EX CREA
TOPICAL_CREAM | CUTANEOUS | Status: AC
  Filled 2017-03-26: qty 85

## 2017-03-26 MED ORDER — CLINDAMYCIN HCL 300 MG PO CAPS: 300.0000 mg | ORAL_CAPSULE | Freq: Three times a day (TID) | ORAL | 0 refills | Status: AC

## 2017-03-26 MED ORDER — CYCLOBENZAPRINE HCL 5 MG PO TABS: 5.0000 mg | ORAL_TABLET | Freq: Three times a day (TID) | ORAL | 0 refills | Status: AC | PRN

## 2017-03-26 MED ORDER — OXYCODONE-ACETAMINOPHEN 5-325 MG PO TABS
1.0000 | ORAL_TABLET | Freq: Once | ORAL | Status: AC
  Administered 2017-03-26: 1 via ORAL
  Filled 2017-03-26: qty 1
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
## 2017-03-26 NOTE — ED Notes (Signed)
Silvadene cream applied - non adhesive bandage applied and then wrapped
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
## 2017-04-03 ENCOUNTER — Other Ambulatory Visit: Payer: Self-pay | Admitting: Chiropractor

## 2017-04-03 ENCOUNTER — Ambulatory Visit
Admission: RE | Admit: 2017-04-03 | Discharge: 2017-04-03 | Disposition: A | Discharge status: Home / Self Care | Payer: No Typology Code available for payment source | Source: Ambulatory Visit | Attending: Chiropractor | Admitting: Chiropractor

## 2017-04-03 DIAGNOSIS — M546 Pain in thoracic spine: Secondary | ICD-10-CM | POA: Diagnosis present

## 2017-04-03 DIAGNOSIS — M545 Low back pain, unspecified: Secondary | ICD-10-CM

## 2017-04-03 DIAGNOSIS — M542 Cervicalgia: Secondary | ICD-10-CM | POA: Diagnosis present

## 2017-04-03 DIAGNOSIS — M4186 Other forms of scoliosis, lumbar region: Secondary | ICD-10-CM | POA: Insufficient documentation

## 2017-06-09 ENCOUNTER — Ambulatory Visit
Admission: EM | Admit: 2017-06-09 | Discharge: 2017-06-09 | Disposition: A | Discharge status: Home / Self Care | Payer: Medicaid Other | Attending: Family Medicine | Admitting: Family Medicine

## 2017-06-09 ENCOUNTER — Encounter: Payer: Self-pay | Admitting: Emergency Medicine

## 2017-06-09 ENCOUNTER — Other Ambulatory Visit: Payer: Self-pay

## 2017-06-09 DIAGNOSIS — H547 Unspecified visual loss: Secondary | ICD-10-CM | POA: Diagnosis not present

## 2017-06-09 DIAGNOSIS — S0012XA Contusion of left eyelid and periocular area, initial encounter: Secondary | ICD-10-CM
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
## 2017-06-09 NOTE — Discharge Instructions (Signed)
Go to the ER.  You need to be seen by an ophthalmologist.  Take care  Dr. Adriana Simasook

## 2017-09-14 ENCOUNTER — Encounter: Payer: Medicaid Other | Admitting: Obstetrics and Gynecology

## 2017-09-26 ENCOUNTER — Ambulatory Visit (INDEPENDENT_AMBULATORY_CARE_PROVIDER_SITE_OTHER): Payer: Medicaid Other | Admitting: Obstetrics and Gynecology

## 2017-09-26 DIAGNOSIS — Z98891 History of uterine scar from previous surgery: Secondary | ICD-10-CM

## 2017-11-01 ENCOUNTER — Ambulatory Visit (INDEPENDENT_AMBULATORY_CARE_PROVIDER_SITE_OTHER): Payer: Medicaid Other | Admitting: Obstetrics and Gynecology

## 2017-11-01 ENCOUNTER — Other Ambulatory Visit: Payer: Self-pay | Admitting: Obstetrics and Gynecology

## 2017-11-01 ENCOUNTER — Encounter: Payer: Self-pay | Admitting: Obstetrics and Gynecology

## 2017-11-01 ENCOUNTER — Other Ambulatory Visit (INDEPENDENT_AMBULATORY_CARE_PROVIDER_SITE_OTHER): Payer: Medicaid Other

## 2017-11-01 VITALS — BP 110/60 | Wt 232.0 lb

## 2017-11-01 DIAGNOSIS — Z124 Encounter for screening for malignant neoplasm of cervix: Secondary | ICD-10-CM

## 2017-11-01 DIAGNOSIS — O0992 Supervision of high risk pregnancy, unspecified, second trimester: Secondary | ICD-10-CM

## 2017-11-01 DIAGNOSIS — N926 Irregular menstruation, unspecified: Secondary | ICD-10-CM

## 2017-11-01 DIAGNOSIS — Z98891 History of uterine scar from previous surgery: Secondary | ICD-10-CM

## 2017-11-01 DIAGNOSIS — Z3A24 24 weeks gestation of pregnancy: Secondary | ICD-10-CM

## 2017-11-01 DIAGNOSIS — O0930 Supervision of pregnancy with insufficient antenatal care, unspecified trimester: Secondary | ICD-10-CM | POA: Diagnosis not present

## 2017-11-01 NOTE — Progress Notes (Signed)
NOB today. Late to care.

## 2017-11-01 NOTE — Progress Notes (Signed)
11/01/2017   Chief Complaint: Missed period  Transfer of Care Patient: no  History of Present Illness: Laura Lucero is a 27 y.o. [id number] Unknown based on Patient's last menstrual period was 05/18/2017 (exact date). with an Estimated Date of Delivery: None noted., with the above CC.   Her periods were: regular periods every 28 days She was using no method when she conceived.  She has Negative signs or symptoms of nausea/vomiting of pregnancy. She has Negative signs or symptoms of miscarriage or preterm labor She identifies Negative Zika risk factors for her and her partner On any different medications around the time she conceived/early pregnancy: No  History of varicella: No   ROS: A 12-point review of systems was performed and negative, except as stated in the above HPI.  OBGYN History: As per HPI. OB History  Gravida Para Term Preterm AB Living  5 3 3     3   SAB TAB Ectopic Multiple Live Births        0 3    # Outcome Date GA Lbr Len/2nd Weight Sex Delivery Anes PTL Lv  5 Current           4 Term 03/07/16 5182w3d  7 lb 7.6 oz (3.39 kg) M CS-LVertical Spinal  LIV  3 Term 12/20/13 6375w0d  7 lb 11 oz (3.487 kg) F CS-LTranv   LIV     Birth Comments: allergic reaction ampicillin  2 Term 06/05/11 4833w0d  7 lb 15 oz (3.6 kg) F Vag-Spont   LIV  1 Gravida             Any issues with any prior pregnancies: no Any prior children are healthy, doing well, without any problems or issues: yes History of pap smears: Yes. Last pap smear 2014. Abnormal: unknown  History of STIs: No   Past Medical History: Past Medical History:  Diagnosis Date  . Anemia     Past Surgical History: Past Surgical History:  Procedure Laterality Date  . CESAREAN SECTION  2015  . CESAREAN SECTION N/A 03/07/2016   Procedure: CESAREAN SECTION;  Surgeon: Conard NovakStephen D Jackson, MD;  Location: [facility] ORS;  Service: Obstetrics;  Laterality: N/A;  . CYST REMOVAL NECK      Family History:  Family History  Problem  Relation Age of Onset  . Healthy Mother   . Healthy Father    She denies any female cancers, bleeding or blood clotting disorders.  She denies any history of mental retardation, birth defects or genetic disorders in her or the FOB's history  Social History:  Social History   Socioeconomic History  . Marital status: Single    Spouse name: Not on file  . Number of children: Not on file  . Years of education: Not on file  . Highest education level: Not on file  Occupational History  . Not on file  Social Needs  . Financial resource strain: Not on file  . Food insecurity:    Worry: Not on file    Inability: Not on file  . Transportation needs:    Medical: Not on file    Non-medical: Not on file  Tobacco Use  . Smoking status: Never Smoker  . Smokeless tobacco: Never Used  Substance and Sexual Activity  . Alcohol use: No  . Drug use: No  . Sexual activity: Yes    Partners: Male    Birth control/protection: None  Lifestyle  . Physical activity:    Days per week: Not on file  Minutes per session: Not on file  . Stress: Not on file  Relationships  . Social connections:    Talks on phone: Not on file    Gets together: Not on file    Attends religious service: Not on file    Active member of club or organization: Not on file    Attends meetings of clubs or organizations: Not on file    Relationship status: Not on file  . Intimate partner violence:    Fear of current or ex partner: Not on file    Emotionally abused: Not on file    Physically abused: Not on file    Forced sexual activity: Not on file  Other Topics Concern  . Not on file  Social History Narrative  . Not on file   Any pets in the household: Not asked    Allergy: Allergies  Allergen Reactions  . Ampicillin Anaphylaxis    Current Outpatient Medications: No current outpatient medications on file.   Physical Exam:   LMP 05/18/2017 (Exact Date)  There is no height or weight on file to  calculate BMI. Constitutional: Well nourished, well developed female in no acute distress.  Neck:  Supple, normal appearance, and no thyromegaly  Cardiovascular: S1, S2 normal, no murmur, rub or gallop, regular rate and rhythm Respiratory:  Clear to auscultation bilateral. Normal respiratory effort Abdomen: positive bowel sounds and no masses, hernias; diffusely non tender to palpation, non distended Breasts: breasts appear normal, no suspicious masses, no skin or nipple changes or axillary nodes. Neuro/Psych:  Normal mood and affect.  Skin:  Warm and dry.  Lymphatic:  No inguinal lymphadenopathy.   Pelvic exam: is limited by body habitus EGBUS: within normal limits, Vagina: within normal limits and with no blood in the vault, Cervix: normal appearing cervix without discharge or lesions, closed/long/high, Uterus:  28cm Fundal height, and Adnexa:  not examined   Assessment: Laura Lucero is a 27 y.o. Z6X0960 Unknown based on Patient's last menstrual period was 05/18/2017 (exact date). with an Estimated Date of Delivery: None noted.,  for prenatal care.  Plan:  1) Avoid alcoholic beverages. 2) Patient encouraged not to smoke.  3) Discontinue the use of all non-medicinal drugs and chemicals.  4) Take prenatal vitamins daily.  5) Seatbelt use advised 6) Nutrition, food safety (fish, cheese advisories, and high nitrite foods) and exercise discussed. 7) Hospital and practice style delivering at [facility] discussed  8) Patient is asked about travel to areas at risk for the Zika virus, and counseled to avoid travel and exposure to mosquitoes or sexual partners who may have themselves been exposed to the virus. Testing is discussed, and will be ordered as appropriate.  9) Childbirth classes at [facility] advised 10) Genetic Screening, such as with 1st Trimester Screening, cell free fetal DNA, AFP testing, and Ultrasound, as well as with amniocentesis and CVS as appropriate, is discussed with patient. She plans  to have genetic testing this pregnancy. NIPS ordered today. 11) She would like to [facility].  Problem list reviewed and updated.  Adelene Idler, MD [facility], [facility] 11/01/2017  2:44 PM

## 2017-11-02 LAB — RPR+RH+ABO+RUB AB+AB SCR+CB...
Antibody Screen: NEGATIVE
HEMOGLOBIN: 10 g/dL — AB (ref 11.1–15.9)
HEP B S AG: NEGATIVE
HIV Screen 4th Generation wRfx: NONREACTIVE
Hematocrit: 30.5 % — ABNORMAL LOW (ref 34.0–46.6)
MCH: 28.9 pg (ref 26.6–33.0)
MCHC: 32.8 g/dL (ref 31.5–35.7)
MCV: 88 fL (ref 79–97)
Platelets: 242 10*3/uL (ref 150–379)
RBC: 3.46 x10E6/uL — ABNORMAL LOW (ref 3.77–5.28)
RDW: 14.3 % (ref 12.3–15.4)
RPR Ser Ql: NONREACTIVE
Rh Factor: POSITIVE
WBC: 9.4 10*3/uL (ref 3.4–10.8)

## 2017-11-03 LAB — HEMOGLOBINOPATHY EVALUATION
HEMOGLOBIN A2 QUANTITATION: 1.8 % (ref 1.8–3.2)
HGB C: 0 %
HGB S: 0 %
HGB VARIANT: 0 %
Hemoglobin F Quantitation: 0 % (ref 0.0–2.0)
Hgb A: 98.2 % (ref 96.4–98.8)

## 2017-11-05 LAB — MATERNIT 21 PLUS CORE, BLOOD
CHROMOSOME 18: NEGATIVE
Chromosome 13: NEGATIVE
Chromosome 21: NEGATIVE
Y CHROMOSOME: NOT DETECTED

## 2017-11-05 LAB — URINE DRUG PANEL 7
Amphetamines, Urine: NEGATIVE ng/mL
Barbiturate Quant, Ur: NEGATIVE ng/mL
Benzodiazepine Quant, Ur: NEGATIVE ng/mL
CANNABINOID QUANT UR: NEGATIVE ng/mL
COCAINE (METAB.): NEGATIVE ng/mL
Opiate Quant, Ur: NEGATIVE ng/mL
PCP Quant, Ur: NEGATIVE ng/mL

## 2017-11-05 LAB — URINE CULTURE

## 2017-11-08 LAB — PAPIG, CTNGTV, HPV, RFX 16/18
Chlamydia, Nuc. Acid Amp: NEGATIVE
Gonococcus, Nuc. Acid Amp: NEGATIVE
HPV GENOTYPE, 16: NEGATIVE
HPV GENOTYPE, 18: NEGATIVE
HPV, HIGH-RISK: POSITIVE — AB
PAP Smear Comment: 0
Trich vag by NAA: NEGATIVE

## 2017-11-15 ENCOUNTER — Ambulatory Visit (INDEPENDENT_AMBULATORY_CARE_PROVIDER_SITE_OTHER): Payer: Medicaid Other | Admitting: Advanced Practice Midwife

## 2017-11-15 ENCOUNTER — Encounter: Payer: Self-pay | Admitting: Advanced Practice Midwife

## 2017-11-15 VITALS — BP 118/78 | Wt 234.0 lb

## 2017-11-15 DIAGNOSIS — O0992 Supervision of high risk pregnancy, unspecified, second trimester: Secondary | ICD-10-CM

## 2017-11-15 DIAGNOSIS — Z130 Encounter for screening for diseases of the blood and blood-forming organs and certain disorders involving the immune mechanism: Secondary | ICD-10-CM

## 2017-11-15 DIAGNOSIS — Z131 Encounter for screening for diabetes mellitus: Secondary | ICD-10-CM

## 2017-11-15 DIAGNOSIS — Z3A25 25 weeks gestation of pregnancy: Secondary | ICD-10-CM

## 2017-11-15 DIAGNOSIS — Z113 Encounter for screening for infections with a predominantly sexual mode of transmission: Secondary | ICD-10-CM

## 2017-11-15 NOTE — Progress Notes (Signed)
No vb. No lof.  

## 2017-11-15 NOTE — Progress Notes (Signed)
Routine Prenatal Care Visit  Subjective  Jon Kasparek is a 27 y.o. G5P3013 at [redacted]w[redacted]d being seen today for ongoing prenatal care.  She is currently monitored for the following issues for this high-risk pregnancy and has History of cesarean delivery; Late prenatal care; and Supervision of high risk pregnancy, antepartum, second trimester on their problem list.  ----------------------------------------------------------------------------------- Patient reports no complaints.   Contractions: Not present. Vag. Bleeding: None.  Movement: Present. Denies leaking of fluid.  ----------------------------------------------------------------------------------- The following portions of the patient's history were reviewed and updated as appropriate: allergies, current medications, past family history, past medical history, past social history, past surgical history and problem list. Problem list updated.   Objective  Blood pressure 118/78, weight 234 lb (106.1 kg), last menstrual period 05/18/2017 Pregravid weight 210 lb (95.3 kg) Total Weight Gain 24 lb (10.9 kg) Urinalysis:      Fetal Status: Fetal Heart Rate (bpm): 142 Fundal Height: 27 cm Movement: Present     General:  Alert, oriented and cooperative. Patient is in no acute distress.  Skin: Skin is warm and dry. No rash noted.   Cardiovascular: Normal heart rate noted  Respiratory: Normal respiratory effort, no problems with respiration noted  Abdomen: Soft, gravid, appropriate for gestational age. Pain/Pressure: Absent     Pelvic:  Cervical exam deferred        Extremities: Normal range of motion.  Edema: None  Mental Status: Normal mood and affect. Normal behavior. Normal judgment and thought content.   Assessment   27 y.o. Z6X0960 at [redacted]w[redacted]d by  02/22/2018, by Last Menstrual Period presenting for routine prenatal visit  Plan   pregnancy #4 Problems (from 11/01/17 to present)    Problem Noted Resolved   Late prenatal care 11/01/2017 by  Natale Milch, MD No   Supervision of high risk pregnancy, antepartum, second trimester 11/01/2017 by Natale Milch, MD No   Overview Addendum 11/01/2017  5:40 PM by Natale Milch, MD      Clinic [facility] Prenatal Labs  Dating  LMP=24 week [place] Blood type: --/--/A POS (05/30 2227)   Genetic Screen   NIPS:   Collected 4/24 Antibody:   Anatomic [place]  Rubella:   Varicella: @  GTT Early:        28 wk:      RPR:     Rhogam  HBsAg:     TDaP vaccine                       HIV:     Flu Shot                                GBS:   Contraception  IUD Pap:  CBB     CS/VBAC  Would like TOLAC   Baby Food    Support Person             History of cesarean delivery 03/07/2016 by Conard Novak, MD No   Overview Signed 11/01/2017  5:41 PM by Natale Milch, MD    Desires TOLAC  Review risks of uterine rupture          Preterm labor symptoms and general obstetric precautions including but not limited to vaginal bleeding, contractions, leaking of fluid and fetal movement were reviewed in detail with the patient.   Return in about 3 weeks (around 12/06/2017) for 28 wk labs and rob.  Tresea Mall,  CNM 11/15/2017 11:33 AM

## 2017-12-06 ENCOUNTER — Encounter: Payer: Medicaid Other | Admitting: Advanced Practice Midwife

## 2017-12-06 ENCOUNTER — Other Ambulatory Visit: Payer: Medicaid Other

## 2017-12-08 ENCOUNTER — Encounter: Payer: Self-pay | Admitting: Maternal Newborn

## 2017-12-08 ENCOUNTER — Ambulatory Visit (INDEPENDENT_AMBULATORY_CARE_PROVIDER_SITE_OTHER): Payer: Medicaid Other | Admitting: Maternal Newborn

## 2017-12-08 ENCOUNTER — Other Ambulatory Visit: Payer: Medicaid Other

## 2017-12-08 VITALS — BP 120/90 | Wt 240.0 lb

## 2017-12-08 DIAGNOSIS — Z113 Encounter for screening for infections with a predominantly sexual mode of transmission: Secondary | ICD-10-CM

## 2017-12-08 DIAGNOSIS — Z131 Encounter for screening for diabetes mellitus: Secondary | ICD-10-CM

## 2017-12-08 DIAGNOSIS — O0992 Supervision of high risk pregnancy, unspecified, second trimester: Secondary | ICD-10-CM

## 2017-12-08 DIAGNOSIS — Z130 Encounter for screening for diseases of the blood and blood-forming organs and certain disorders involving the immune mechanism: Secondary | ICD-10-CM

## 2017-12-08 NOTE — Patient Instructions (Signed)
Third Trimester of Pregnancy The third trimester is from week 28 through week 40 (months 7 through 9). The third trimester is a time when the unborn baby (fetus) is growing rapidly. At the end of the ninth month, the fetus is about 20 inches in length and weighs 6-10 pounds. Body changes during your third trimester Your body will continue to go through many changes during pregnancy. The changes vary from woman to woman. During the third trimester:  Your weight will continue to increase. You can expect to gain 25-35 pounds (11-16 kg) by the end of the pregnancy.  You may begin to get stretch marks on your hips, abdomen, and breasts.  You may urinate more often because the fetus is moving lower into your pelvis and pressing on your bladder.  You may develop or continue to have heartburn. This is caused by increased hormones that slow down muscles in the digestive tract.  You may develop or continue to have constipation because increased hormones slow digestion and cause the muscles that push waste through your intestines to relax.  You may develop hemorrhoids. These are swollen veins (varicose veins) in the rectum that can itch or be painful.  You may develop swollen, bulging veins (varicose veins) in your legs.  You may have increased body aches in the pelvis, back, or thighs. This is due to weight gain and increased hormones that are relaxing your joints.  You may have changes in your hair. These can include thickening of your hair, rapid growth, and changes in texture. Some women also have hair loss during or after pregnancy, or hair that feels dry or thin. Your hair will most likely return to normal after your baby is born.  Your breasts will continue to grow and they will continue to become tender. A yellow fluid (colostrum) may leak from your breasts. This is the first milk you are producing for your baby.  Your belly button may stick out.  You may notice more swelling in your hands,  face, or ankles.  You may have increased tingling or numbness in your hands, arms, and legs. The skin on your belly may also feel numb.  You may feel short of breath because of your expanding uterus.  You may have more problems sleeping. This can be caused by the size of your belly, increased need to urinate, and an increase in your body's metabolism.  You may notice the fetus "dropping," or moving lower in your abdomen (lightening).  You may have increased vaginal discharge.  You may notice your joints feel loose and you may have pain around your pelvic bone.  What to expect at prenatal visits You will have prenatal exams every 2 weeks until week 36. Then you will have weekly prenatal exams. During a routine prenatal visit:  You will be weighed to make sure you and the baby are growing normally.  Your blood pressure will be taken.  Your abdomen will be measured to track your baby's growth.  The fetal heartbeat will be listened to.  Any test results from the previous visit will be discussed.  You may have a cervical check near your due date to see if your cervix has softened or thinned (effaced).  You will be tested for Group B streptococcus. This happens between 35 and 37 weeks.  Your health care provider may ask you:  What your birth plan is.  How you are feeling.  If you are feeling the baby move.  If you have had   any abnormal symptoms, such as leaking fluid, bleeding, severe headaches, or abdominal cramping.  If you are using any tobacco products, including cigarettes, chewing tobacco, and electronic cigarettes.  If you have any questions.  Other tests or screenings that may be performed during your third trimester include:  Blood tests that check for low iron levels (anemia).  Fetal testing to check the health, activity level, and growth of the fetus. Testing is done if you have certain medical conditions or if there are problems during the  pregnancy.  Nonstress test (NST). This test checks the health of your baby to make sure there are no signs of problems, such as the baby not getting enough oxygen. During this test, a belt is placed around your belly. The baby is made to move, and its heart rate is monitored during movement.  What is false labor? False labor is a condition in which you feel small, irregular tightenings of the muscles in the womb (contractions) that usually go away with rest, changing position, or drinking water. These are called Braxton Hicks contractions. Contractions may last for hours, days, or even weeks before true labor sets in. If contractions come at regular intervals, become more frequent, increase in intensity, or become painful, you should see your health care provider. What are the signs of labor?  Abdominal cramps.  Regular contractions that start at 10 minutes apart and become stronger and more frequent with time.  Contractions that start on the top of the uterus and spread down to the lower abdomen and back.  Increased pelvic pressure and dull back pain.  A watery or bloody mucus discharge that comes from the vagina.  Leaking of amniotic fluid. This is also known as your "water breaking." It could be a slow trickle or a gush. Let your health care provider know if it has a color or strange odor. If you have any of these signs, call your health care provider right away, even if it is before your due date. Follow these instructions at home: Medicines  Follow your health care provider's instructions regarding medicine use. Specific medicines may be either safe or unsafe to take during pregnancy.  Take a prenatal vitamin that contains at least 600 micrograms (mcg) of folic acid.  If you develop constipation, try taking a stool softener if your health care provider approves. Eating and drinking  Eat a balanced diet that includes fresh fruits and vegetables, whole grains, good sources of protein  such as meat, eggs, or tofu, and low-fat dairy. Your health care provider will help you determine the amount of weight gain that is right for you.  Avoid raw meat and uncooked cheese. These carry germs that can cause birth defects in the baby.  If you have low calcium intake from food, talk to your health care provider about whether you should take a daily calcium supplement.  Eat four or five small meals rather than three large meals a day.  Limit foods that are high in fat and processed sugars, such as fried and sweet foods.  To prevent constipation: ? Drink enough fluid to keep your urine clear or pale yellow. ? Eat foods that are high in fiber, such as fresh fruits and vegetables, whole grains, and beans. Activity  Exercise only as directed by your health care provider. Most women can continue their usual exercise routine during pregnancy. Try to exercise for 30 minutes at least 5 days a week. Stop exercising if you experience uterine contractions.  Avoid heavy   lifting.  Do not exercise in extreme heat or humidity, or at high altitudes.  Wear low-heel, comfortable shoes.  Practice good posture.  You may continue to have sex unless your health care provider tells you otherwise. Relieving pain and discomfort  Take frequent breaks and rest with your legs elevated if you have leg cramps or low back pain.  Take warm sitz baths to soothe any pain or discomfort caused by hemorrhoids. Use hemorrhoid cream if your health care provider approves.  Wear a good support bra to prevent discomfort from breast tenderness.  If you develop varicose veins: ? Wear support pantyhose or compression stockings as told by your healthcare provider. ? Elevate your feet for 15 minutes, 3-4 times a day. Prenatal care  Write down your questions. Take them to your prenatal visits.  Keep all your prenatal visits as told by your health care provider. This is important. Safety  Wear your seat belt at  all times when driving.  Make a list of emergency phone numbers, including numbers for family, friends, the hospital, and police and fire departments. General instructions  Avoid cat litter boxes and soil used by cats. These carry germs that can cause birth defects in the baby. If you have a cat, ask someone to clean the litter box for you.  Do not travel far distances unless it is absolutely necessary and only with the approval of your health care provider.  Do not use hot tubs, steam rooms, or saunas.  Do not drink alcohol.  Do not use any products that contain nicotine or tobacco, such as cigarettes and e-cigarettes. If you need help quitting, ask your health care provider.  Do not use any medicinal herbs or unprescribed drugs. These chemicals affect the formation and growth of the baby.  Do not douche or use tampons or scented sanitary pads.  Do not cross your legs for long periods of time.  To prepare for the arrival of your baby: ? Take prenatal classes to understand, practice, and ask questions about labor and delivery. ? Make a trial run to the hospital. ? Visit the hospital and tour the maternity area. ? Arrange for maternity or paternity leave through employers. ? Arrange for family and friends to take care of pets while you are in the hospital. ? Purchase a rear-facing car seat and make sure you know how to install it in your car. ? Pack your hospital bag. ? Prepare the baby's nursery. Make sure to remove all pillows and stuffed animals from the baby's crib to prevent suffocation.  Visit your dentist if you have not gone during your pregnancy. Use a soft toothbrush to brush your teeth and be gentle when you floss. Contact a health care provider if:  You are unsure if you are in labor or if your water has broken.  You become dizzy.  You have mild pelvic cramps, pelvic pressure, or nagging pain in your abdominal area.  You have lower back pain.  You have persistent  nausea, vomiting, or diarrhea.  You have an unusual or bad smelling vaginal discharge.  You have pain when you urinate. Get help right away if:  Your water breaks before 37 weeks.  You have regular contractions less than 5 minutes apart before 37 weeks.  You have a fever.  You are leaking fluid from your vagina.  You have spotting or bleeding from your vagina.  You have severe abdominal pain or cramping.  You have rapid weight loss or weight gain.    You have shortness of breath with chest pain.  You notice sudden or extreme swelling of your face, hands, ankles, feet, or legs.  Your baby makes fewer than 10 movements in 2 hours.  You have severe headaches that do not go away when you take medicine.  You have vision changes. Summary  The third trimester is from week 28 through week 40, months 7 through 9. The third trimester is a time when the unborn baby (fetus) is growing rapidly.  During the third trimester, your discomfort may increase as you and your baby continue to gain weight. You may have abdominal, leg, and back pain, sleeping problems, and an increased need to urinate.  During the third trimester your breasts will keep growing and they will continue to become tender. A yellow fluid (colostrum) may leak from your breasts. This is the first milk you are producing for your baby.  False labor is a condition in which you feel small, irregular tightenings of the muscles in the womb (contractions) that eventually go away. These are called Braxton Hicks contractions. Contractions may last for hours, days, or even weeks before true labor sets in.  Signs of labor can include: abdominal cramps; regular contractions that start at 10 minutes apart and become stronger and more frequent with time; watery or bloody mucus discharge that comes from the vagina; increased pelvic pressure and dull back pain; and leaking of amniotic fluid. This information is not intended to replace advice  given to you by your health care provider. Make sure you discuss any questions you have with your health care provider. Document Released: 06/21/2001 Document Revised: 12/03/2015 Document Reviewed: 08/28/2012 [employer] Interactive Patient Education  2017 [employer]  

## 2017-12-08 NOTE — Progress Notes (Signed)
No [email or web address] 

## 2017-12-08 NOTE — Progress Notes (Signed)
Routine Prenatal Care Visit  Subjective  Laura Lucero is a 27 y.o. G5P3013 at [redacted]w[redacted]d being seen today for ongoing prenatal care.  She is currently monitored for the following issues for this high-risk pregnancy and has History of cesarean delivery; Late prenatal care; and Supervision of high risk pregnancy, antepartum, second trimester on their problem list.  ----------------------------------------------------------------------------------- Patient reports no complaints.   Contractions: Not present. Vag. Bleeding: None.  Movement: Present. No leaking of fluid.  ----------------------------------------------------------------------------------- The following portions of the patient's history were reviewed and updated as appropriate: allergies, current medications, past family history, past medical history, past social history, past surgical history and problem list. Problem list updated.   Objective  Blood pressure 120/90, weight 240 lb (108.9 kg), last menstrual period 05/18/2017. Pregravid weight 210 lb (95.3 kg) Total Weight Gain 30 lb (13.6 kg) Urinalysis: Urine Protein: Negative Urine Glucose: Negative  Fetal Status: Fetal Heart Rate (bpm): 140 Fundal Height: 29 cm Movement: Present     General:  Alert, oriented and cooperative. Patient is in no acute distress.  Skin: Skin is warm and dry. No rash noted.   Cardiovascular: Normal heart rate noted  Respiratory: Normal respiratory effort, no problems with respiration noted  Abdomen: Soft, gravid, appropriate for gestational age. Pain/Pressure: Absent     Pelvic:  Cervical exam deferred        Extremities: Normal range of motion.  Edema: None  Mental Status: Normal mood and affect. Normal behavior. Normal judgment and thought content.     Assessment   27 y.o. [id number] at [redacted]w[redacted]d, EDD 02/22/2018 by Last Menstrual Period presenting for routine prenatal visit.  Plan   pregnancy #4 Problems (from 11/01/17 to present)    Problem  Noted Resolved   Late prenatal care 11/01/2017 by Natale Milch, MD No   Supervision of high risk pregnancy, antepartum, second trimester 11/01/2017 by Natale Milch, MD No   Overview Addendum 11/01/2017  5:40 PM by Natale Milch, MD      Clinic [facility] Prenatal Labs  Dating  LMP=24 week [place] Blood type: --/--/A POS (05/30 2227)   Genetic Screen   NIPS:   Collected 4/24 Antibody:   Anatomic [place]  Rubella:   Varicella: @  GTT Early:        28 wk:      RPR:     Rhogam  HBsAg:     TDaP vaccine                       HIV:     Flu Shot                                GBS:   Contraception  IUD Pap:  CBB     CS/VBAC  Would like TOLAC   Baby Food    Support Person             History of cesarean delivery 03/07/2016 by Conard Novak, MD No   Overview Signed 11/01/2017  5:41 PM by Natale Milch, MD    Desires TOLAC  Review risks of uterine rupture          Preterm labor symptoms and general obstetric precautions including but not limited to vaginal bleeding, contractions, leaking of fluid and fetal movement were reviewed. Please refer to After Visit Summary for other counseling recommendations.   Return in about 2 weeks (around  12/22/2017) for ROB.  Marcelyn Bruins, CNM 12/08/2017

## 2017-12-09 LAB — 28 WEEK RH+PANEL
BASOS: 0 %
Basophils Absolute: 0 10*3/uL (ref 0.0–0.2)
EOS (ABSOLUTE): 0.1 10*3/uL (ref 0.0–0.4)
Eos: 1 %
GESTATIONAL DIABETES SCREEN: 75 mg/dL (ref 65–139)
HEMOGLOBIN: 9.5 g/dL — AB (ref 11.1–15.9)
HIV Screen 4th Generation wRfx: NONREACTIVE
Hematocrit: 28.9 % — ABNORMAL LOW (ref 34.0–46.6)
Immature Grans (Abs): 0 10*3/uL (ref 0.0–0.1)
Immature Granulocytes: 0 %
Lymphocytes Absolute: 1.7 10*3/uL (ref 0.7–3.1)
Lymphs: 21 %
MCH: 28.7 pg (ref 26.6–33.0)
MCHC: 32.9 g/dL (ref 31.5–35.7)
MCV: 87 fL (ref 79–97)
MONOS ABS: 0.3 10*3/uL (ref 0.1–0.9)
Monocytes: 4 %
NEUTROS ABS: 6 10*3/uL (ref 1.4–7.0)
Neutrophils: 74 %
Platelets: 233 10*3/uL (ref 150–450)
RBC: 3.31 x10E6/uL — AB (ref 3.77–5.28)
RDW: 14.2 % (ref 12.3–15.4)
RPR: NONREACTIVE
WBC: 8.1 10*3/uL (ref 3.4–10.8)

## 2017-12-22 ENCOUNTER — Encounter: Payer: Medicaid Other | Admitting: Obstetrics & Gynecology

## 2017-12-27 ENCOUNTER — Encounter: Payer: Medicaid Other | Admitting: Advanced Practice Midwife

## 2018-01-22 ENCOUNTER — Encounter: Payer: Medicaid Other | Admitting: Obstetrics and Gynecology

## 2018-02-10 IMAGING — US US OB TRANSVAGINAL
1 series · 13 of 28 positions shown · non-contrast
Comparison: Pelvic ultrasound performed 07/22/2013

CLINICAL DATA: Acute onset of vaginal bleeding.  Initial encounter.

EXAM:
OBSTETRIC <14 WK US AND TRANSVAGINAL OB US
TECHNIQUE: Both transabdominal and transvaginal ultrasound examinations were
performed for complete evaluation of the gestation as well as the
maternal uterus, adnexal regions, and pelvic cul-de-sac.
Transvaginal technique was performed to assess early pregnancy.

[Series 1: us ob transvaginal · 0.22mm/px · 13 of 56 slices shown]
[im 3/56]
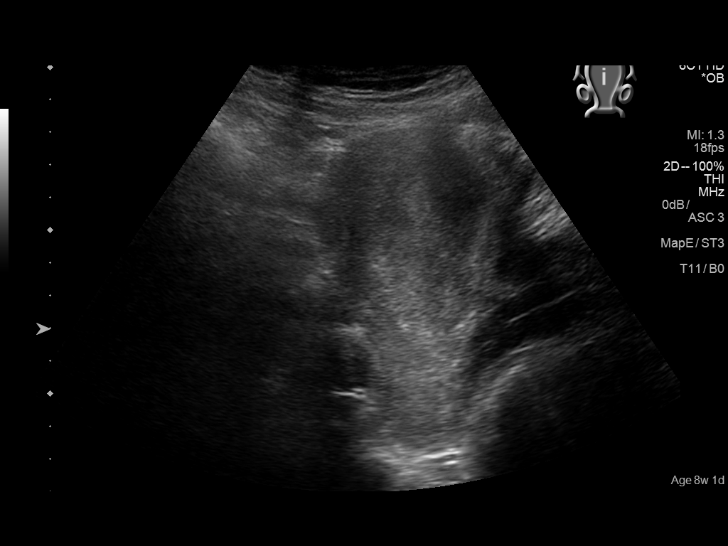
[im 7/56]
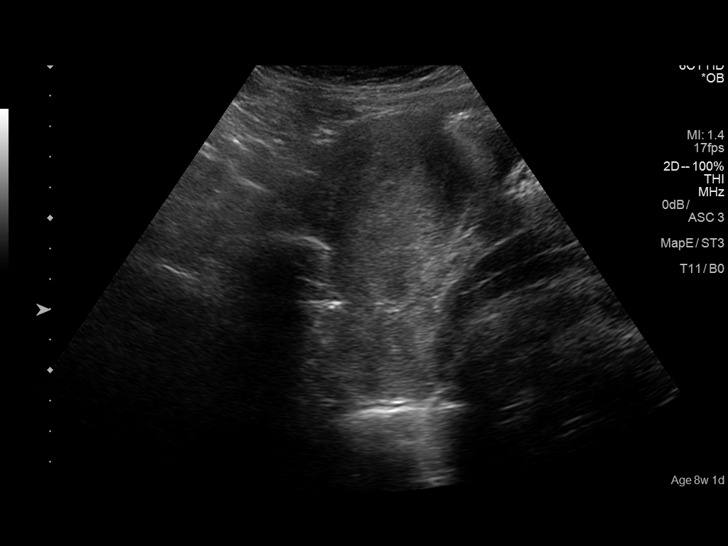
[im 11/56]
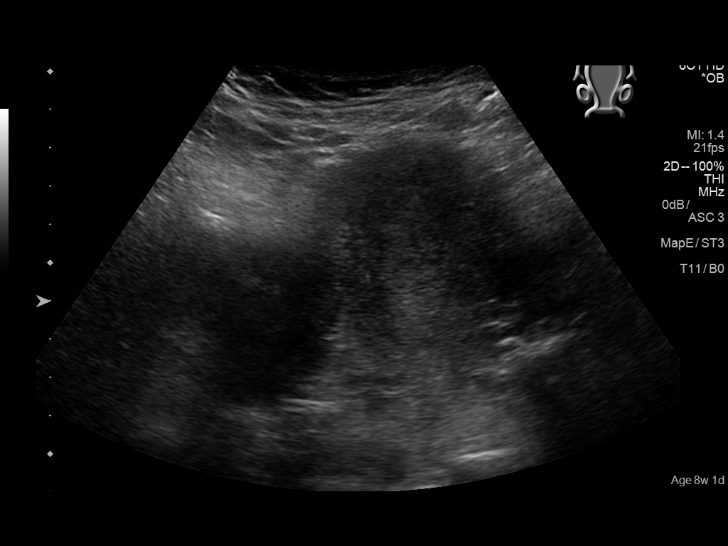
[im 15/56]
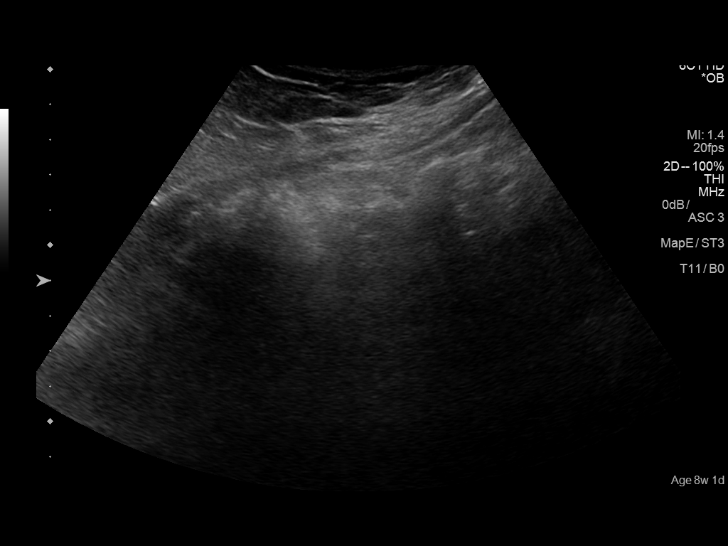
[im 19/56]
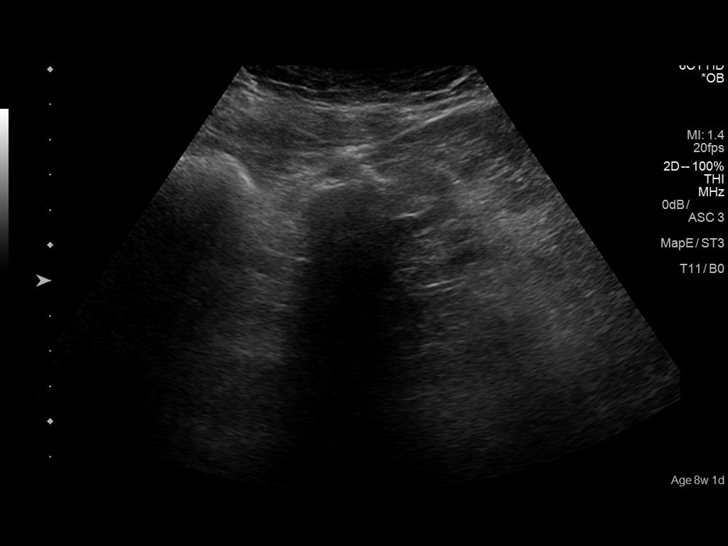
[im 23/56]
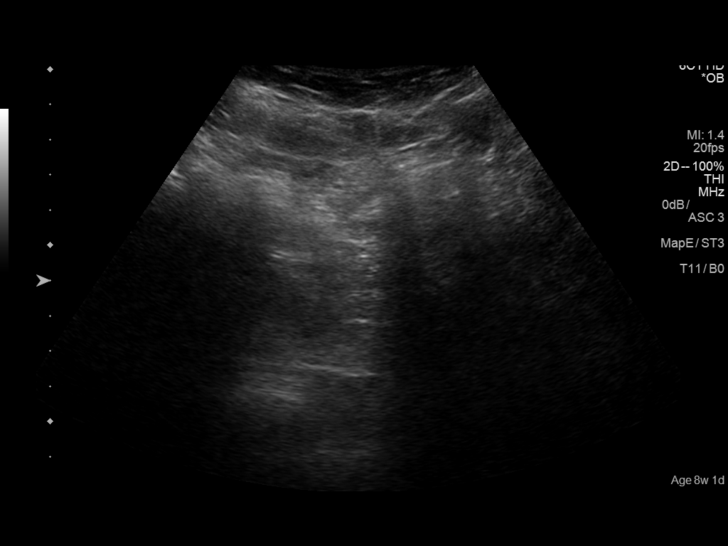
[im 29/56]
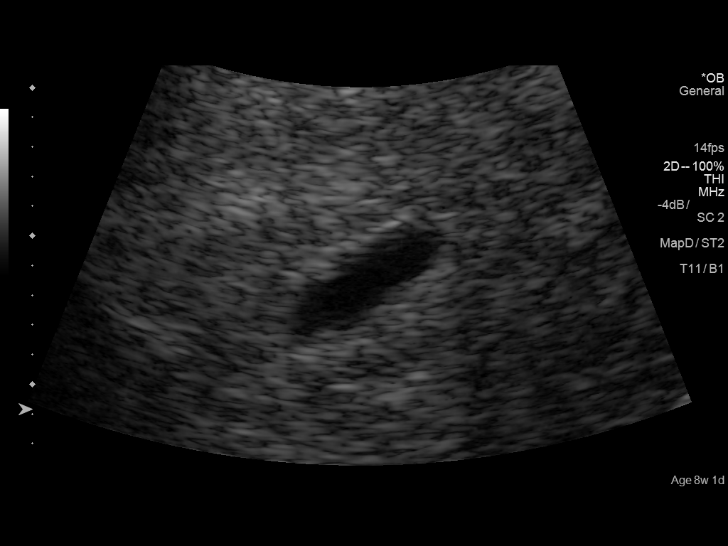
[im 33/56]
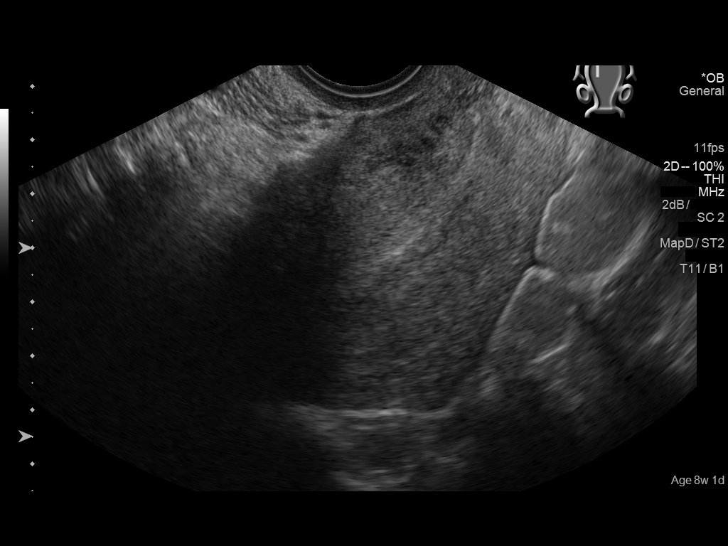
[im 37/56]
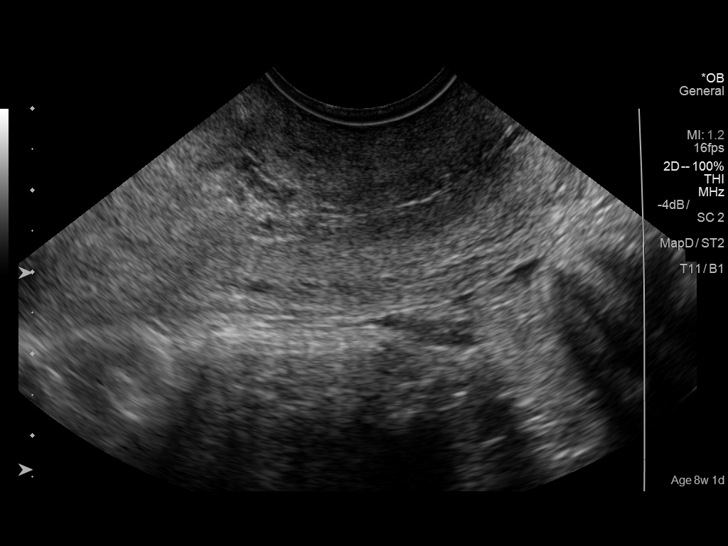
[im 41/56]
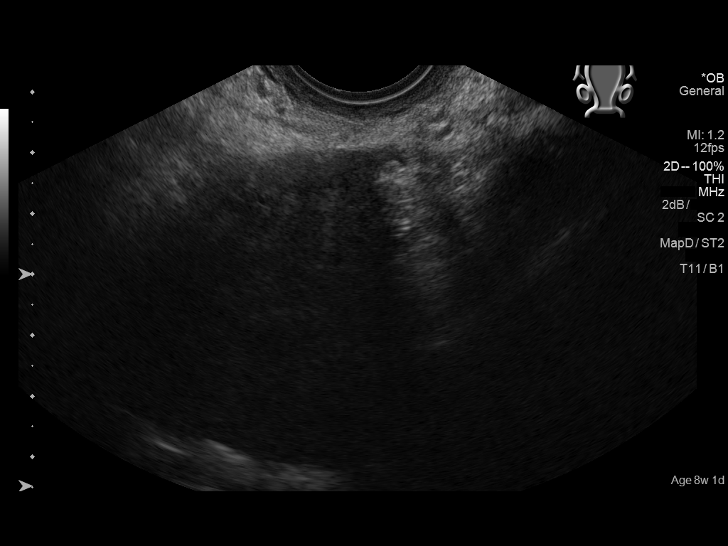
[im 45/56]
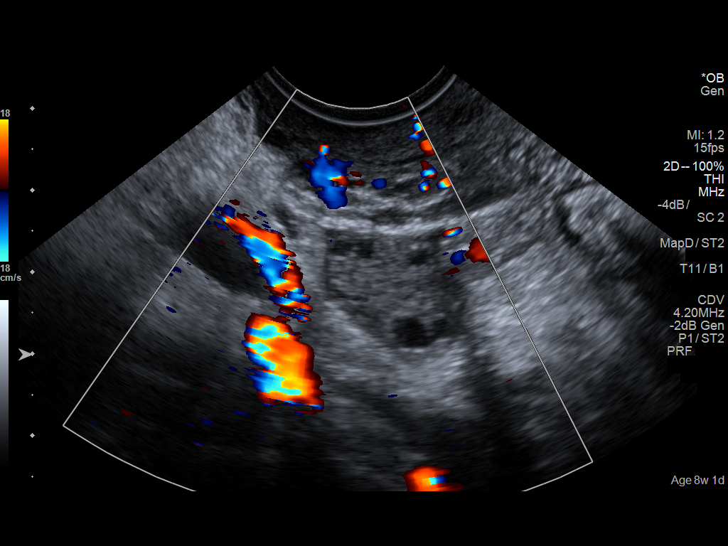
[im 49/56]
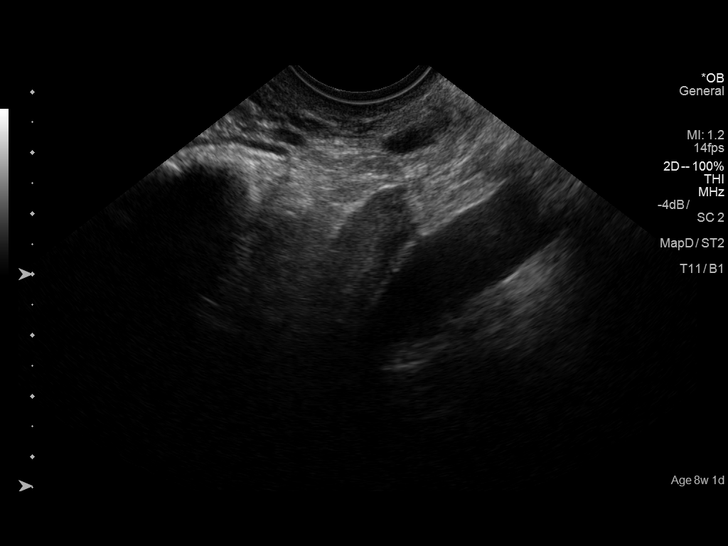
[im 53/56]
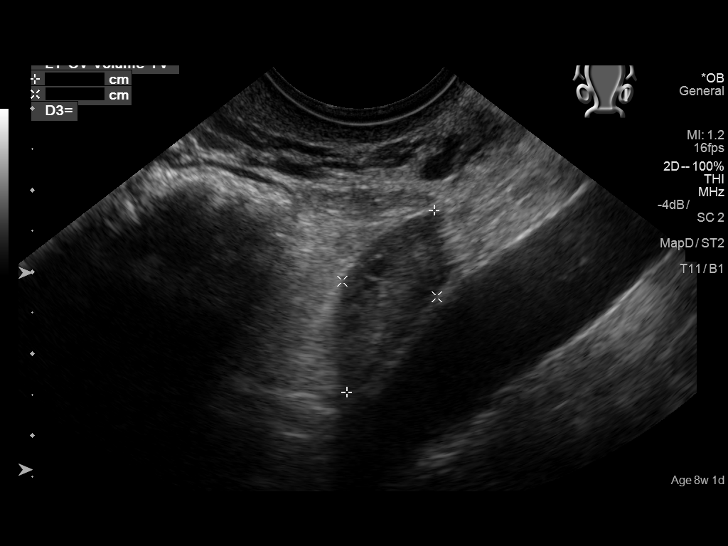

[13 of 28 positions shown; findings below may reference images not displayed]

FINDINGS: Intrauterine gestational sac: Single; visualized and mildly
irregular in shape.

Yolk sac:  No

Embryo:  No

Cardiac Activity: N/A

MSD: 7.0 mm   5 w   3 d

Subchorionic hemorrhage:  None visualized.

Maternal uterus/adnexae: The uterus otherwise unremarkable in
appearance.

The ovaries are within normal limits. The right ovary measures 3.1 x
1.5 x 2.0 cm, while left ovary measures 2.5 x 1.2 x 1.9 cm. No
suspicious adnexal masses are seen; there is no evidence for ovarian
torsion.

No free fluid is seen within the pelvic cul-de-sac.
IMPRESSION: Single intrauterine gestational sac noted, mildly irregular in
appearance. No yolk sac or embryo is yet seen. The gestational sac
has a mean sac diameter of 7 mm, corresponding to a gestational age
of 5 weeks 3 days. This does not match the gestational age by LMP,
though it remains too early to determine an estimated date of
delivery. Would perform follow-up pelvic ultrasound in 2 weeks for
further evaluation, if the patient's quantitative beta HCG continues
to rise.

## 2018-02-12 ENCOUNTER — Other Ambulatory Visit (HOSPITAL_COMMUNITY)
Admission: RE | Admit: 2018-02-12 | Discharge: 2018-02-12 | Disposition: A | Discharge status: Home / Self Care | Payer: Medicaid Other | Source: Ambulatory Visit | Attending: Obstetrics and Gynecology | Admitting: Obstetrics and Gynecology

## 2018-02-12 ENCOUNTER — Ambulatory Visit (INDEPENDENT_AMBULATORY_CARE_PROVIDER_SITE_OTHER): Payer: Medicaid Other | Admitting: Obstetrics and Gynecology

## 2018-02-12 VITALS — BP 120/80 | Wt 246.0 lb

## 2018-02-12 DIAGNOSIS — O99013 Anemia complicating pregnancy, third trimester: Secondary | ICD-10-CM | POA: Insufficient documentation

## 2018-02-12 DIAGNOSIS — O99019 Anemia complicating pregnancy, unspecified trimester: Secondary | ICD-10-CM | POA: Insufficient documentation

## 2018-02-12 DIAGNOSIS — Z98891 History of uterine scar from previous surgery: Secondary | ICD-10-CM

## 2018-02-12 DIAGNOSIS — Z3A38 38 weeks gestation of pregnancy: Secondary | ICD-10-CM

## 2018-02-12 DIAGNOSIS — O0992 Supervision of high risk pregnancy, unspecified, second trimester: Secondary | ICD-10-CM

## 2018-02-12 DIAGNOSIS — O0933 Supervision of pregnancy with insufficient antenatal care, third trimester: Secondary | ICD-10-CM | POA: Insufficient documentation

## 2018-02-12 DIAGNOSIS — O34219 Maternal care for unspecified type scar from previous cesarean delivery: Secondary | ICD-10-CM

## 2018-02-12 DIAGNOSIS — O0930 Supervision of pregnancy with insufficient antenatal care, unspecified trimester: Secondary | ICD-10-CM

## 2018-02-12 DIAGNOSIS — Z23 Encounter for immunization: Secondary | ICD-10-CM

## 2018-02-12 DIAGNOSIS — O26843 Uterine size-date discrepancy, third trimester: Secondary | ICD-10-CM | POA: Insufficient documentation

## 2018-02-12 MED ORDER — TETANUS-DIPHTH-ACELL PERTUSSIS 5-2.5-18.5 LF-MCG/0.5 IM SUSP
0.5000 mL | Freq: Once | INTRAMUSCULAR | Status: AC
  Administered 2018-02-12: 0.5 mL via INTRAMUSCULAR

## 2018-02-12 NOTE — Patient Instructions (Addendum)
27 y.o. [id number] at 6122w4d with Estimated Date of Delivery: 02/22/18 was seen today in office to discuss trial of labor after cesarean section (TOLAC) versus elective repeat cesarean delivery (ERCD). The following risks were discussed with the patient.  Risk of uterine rupture at term is 0.78 percent with TOLAC and 0.22 percent with ERCD. 1 in 10 uterine ruptures will result in neonatal death or neurological injury. The benefits of a trial of labor after cesarean (TOLAC) resulting in a vaginal birth after cesarean (VBAC) include the following: shorter length of hospital stay and postpartum recovery (in most cases); fewer complications, such as postpartum fever, wound or uterine infection, thromboembolism (blood clots in the leg or lung), need for blood transfusion and fewer neonatal breathing problems. The risks of an attempted VBAC or TOLAC include the following: . Risk of failed trial of labor after cesarean (TOLAC) without a vaginal birth after cesarean (VBAC) resulting in repeat cesarean delivery (RCD) in about 20 to 40 percent of women who attempt VBAC.  Her individualized success rate using the MFMU VBAC risk calculator is 59.%.   Marland Kitchen. Risk of rupture of uterus resulting in an emergency cesarean delivery. The risk of uterine rupture may be related in part to the type of uterine incision made during the first cesarean delivery. A previous transverse uterine incision has the lowest risk of rupture (0.2 to 1.5 percent risk). Vertical or T-shaped uterine incisions have a higher risk of uterine rupture (4 to 9 percent risk)The risk of fetal death is very low with both VBAC and elective repeat cesarean delivery (ERCD), but the likelihood of fetal death is higher with VBAC than with ERCD. Maternal death is very rare with either type of delivery. The risks of an elective repeat cesarean delivery (ERCD) were reviewed with the patient including but not limited to: 08/998 risk of uterine rupture which could have serious  consequences, bleeding which may require transfusion; infection which may require antibiotics; injury to bowel, bladder or other surrounding organs (bowel, bladder, ureters); injury to the fetus; need for additional procedures including hysterectomy in the event of a life-threatening hemorrhage; thromboembolic phenomenon; abnormal placentation; incisional problems; death and other postoperative or anesthesia complications.    In addition we discussed that our collective office practice is to allow patient's who desire to attempt TOLAC to go into labor naturally.  There is some limited data that rupture rate may increase past [redacted] weeks gestation, but it is reasonable for women who [facility] to continue pregnancy into the 41st week.  Medical indications necessetating early delivery may arise during the course of any pregnancy.  Given the contraindication on the use of prostaglandins for use in cervical ripening,  recommendation would be to proceed with repeat cesarean for delivery for patient's with unfavorable cervix (low Bishops score) who reach 41 weeks or who otherwise have a medical indication for early delivery.   These risks and benefits are summarized on the consent form, which was reviewed with the patient during the visit.  All her questions answered and she signed a consent indicating a preference for TOLAC/ERCD. A copy of the consent was given to the patient.

## 2018-02-12 NOTE — Progress Notes (Signed)
27 y.o. [id number] at [redacted]w[redacted]d with Estimated Date of Delivery: 02/22/18 was seen today in office to discuss trial of labor after cesarean section (TOLAC) versus elective repeat cesarean delivery (ERCD). The following risks were discussed with the patient.  Risk of uterine rupture at term is 0.78 percent with TOLAC and 0.22 percent with ERCD. 1 in 10 uterine ruptures will result in neonatal death or neurological injury. The benefits of a trial of labor after cesarean (TOLAC) resulting in a vaginal birth after cesarean (VBAC) include the following: shorter length of hospital stay and postpartum recovery (in most cases); fewer complications, such as postpartum fever, wound or uterine infection, thromboembolism (blood clots in the leg or lung), need for blood transfusion and fewer neonatal breathing problems. The risks of an attempted VBAC or TOLAC include the following: . Risk of failed trial of labor after cesarean (TOLAC) without a vaginal birth after cesarean (VBAC) resulting in repeat cesarean delivery (RCD) in about 20 to 40 percent of women who attempt VBAC.  Her individualized success rate using the MFMU VBAC risk calculator is 59.%.   Marland Kitchen Risk of rupture of uterus resulting in an emergency cesarean delivery. The risk of uterine rupture may be related in part to the type of uterine incision made during the first cesarean delivery. A previous transverse uterine incision has the lowest risk of rupture (0.2 to 1.5 percent risk). Vertical or T-shaped uterine incisions have a higher risk of uterine rupture (4 to 9 percent risk)The risk of fetal death is very low with both VBAC and elective repeat cesarean delivery (ERCD), but the likelihood of fetal death is higher with VBAC than with ERCD. Maternal death is very rare with either type of delivery. The risks of an elective repeat cesarean delivery (ERCD) were reviewed with the patient including but not limited to: 08/998 risk of uterine rupture which could have  serious consequences, bleeding which may require transfusion; infection which may require antibiotics; injury to bowel, bladder or other surrounding organs (bowel, bladder, ureters); injury to the fetus; need for additional procedures including hysterectomy in the event of a life-threatening hemorrhage; thromboembolic phenomenon; abnormal placentation; incisional problems; death and other postoperative or anesthesia complications.    In addition we discussed that our collective office practice is to allow patient's who desire to attempt TOLAC to go into labor naturally.  There is some limited data that rupture rate may increase past [redacted] weeks gestation, but it is reasonable for women who are strongly committed to [facility] to continue pregnancy into the 41st week.  Medical indications necessetating early delivery may arise during the course of any pregnancy.  Given the contraindication on the use of prostaglandins for use in cervical ripening,  recommendation would be to proceed with repeat cesarean for delivery for patient's with unfavorable cervix (low Bishops score) who reach 41 weeks or who otherwise have a medical indication for early delivery.   These risks and benefits are summarized on the consent form, which was reviewed with the patient during the visit.  All her questions answered and she signed a consent indicating a preference for TOLAC/ERCD. A copy of the consent was given to the patient.     Routine Prenatal Care Visit  Subjective  Kemper Hochman is a 27 y.o. G5P3013 at [redacted]w[redacted]d being seen today for ongoing prenatal care.  She is currently monitored for the following issues for this high-risk pregnancy and has History of cesarean delivery; Late prenatal care; Supervision of high risk pregnancy, antepartum, second trimester; Limited  prenatal care in third trimester; Anemia of mother in pregnancy, antepartum, third trimester; and Uterine size date discrepancy pregnancy, third trimester on their  problem list.  ----------------------------------------------------------------------------------- Patient reports no complaints.   Contractions: Not present. Vag. Bleeding: None.  Movement: Present. Denies leaking of fluid.  ----------------------------------------------------------------------------------- The following portions of the patient's history were reviewed and updated as appropriate: allergies, current medications, past family history, past medical history, past social history, past surgical history and problem list. Problem list updated.   Objective  Blood pressure 120/80, weight 246 lb (111.6 kg), last menstrual period 05/18/2017, unknown if currently breastfeeding. Pregravid weight 210 lb (95.3 kg) Total Weight Gain 36 lb (16.3 kg) Urinalysis: Urine Protein: Negative Urine Glucose: Negative  Fetal Status:     Movement: Present     General:  Alert, oriented and cooperative. Patient is in no acute distress.  Skin: Skin is warm and dry. No rash noted.   Cardiovascular: Normal heart rate noted  Respiratory: Normal respiratory effort, no problems with respiration noted  Abdomen: Soft, gravid, appropriate for gestational age. Pain/Pressure: Absent     Pelvic:  Cervical exam deferred        Extremities: Normal range of motion.     ental Status: Normal mood and affect. Normal behavior. Normal judgment and thought content.     Assessment   27 y.o. [id number] at [redacted]w[redacted]d by  02/22/2018, by Last Menstrual Period presenting for routine prenatal visit  Plan   pregnancy #4 Problems (from 11/01/17 to present)    Problem Noted Resolved   Anemia of mother in pregnancy, antepartum, third trimester 02/12/2018 by Natale Milch, MD No   Uterine size date discrepancy pregnancy, third trimester 02/12/2018 by Natale Milch, MD No   Late prenatal care 11/01/2017 by Natale Milch, MD No   Supervision of high risk pregnancy, antepartum, second trimester 11/01/2017 by Natale Milch, MD No   Overview Addendum 02/12/2018  4:17 PM by Natale Milch, MD      Clinic [facility] Prenatal Labs  Dating  LMP=24 week [place] Blood type: A/Positive/-- (04/24 1559)   Genetic Screen   NIPS:   Collected 4/24 Antibody:Negative (04/24 1559)  Anatomic [place]  Rubella: <0.90 (04/24 1559) NONIMMUNE Varicella: NONIMMUNE  GTT 28 wk:  75    RPR: Non Reactive (05/31 1136)   Rhogam Not applicable HBsAg: Negative (04/24 1559)   TDaP vaccine                       HIV: Non Reactive (05/31 1136)   Flu Shot                                GBS:   Contraception  IUD Pap:  CBB     CS/VBAC  Would like TOLAC   Baby Food    Support Person             History of cesarean delivery 03/07/2016 by Conard Novak, MD No   Overview Signed 11/01/2017  5:41 PM by Natale Milch, MD    Desires TOLAC [ x] Review risks of uterine rupture          Gestational age appropriate obstetric precautions including but not limited to vaginal bleeding, contractions, leaking of fluid and fetal movement were reviewed in detail with the patient.    Will need anatomy US for growth and follow up anatomy scan.  GB  GC/CT today UDS for extended absence from practice.  TDAP today. Discussed TOLAC risks with patient. After consideration she elected for delivery by cesarean section.  Cesarean section scheduled for next Wednesday. Consents signed.  Uterine size date discrepancy, will have her return ASAP for an US.   Return in about 3 days (around 02/15/2018) for US and ROB ASAP.  Adelene Idlerhristanna Schawn Byas MD [facility], [facility] 02/12/18 4:23 PM

## 2018-02-13 LAB — URINE DRUG PANEL 7
AMPHETAMINES, URINE: NEGATIVE ng/mL
Barbiturate Quant, Ur: NEGATIVE ng/mL
Benzodiazepine Quant, Ur: NEGATIVE ng/mL
CANNABINOID QUANT UR: NEGATIVE ng/mL
Cocaine (Metab.): NEGATIVE ng/mL
Opiate Quant, Ur: NEGATIVE ng/mL
PCP QUANT UR: NEGATIVE ng/mL

## 2018-02-14 LAB — CERVICOVAGINAL ANCILLARY ONLY
CHLAMYDIA, DNA PROBE: NEGATIVE
Neisseria Gonorrhea: NEGATIVE

## 2018-02-15 LAB — CULTURE, BETA STREP (GROUP B ONLY): STREP GP B CULTURE: POSITIVE — AB

## 2018-02-19 ENCOUNTER — Ambulatory Visit (INDEPENDENT_AMBULATORY_CARE_PROVIDER_SITE_OTHER): Payer: Medicaid Other | Admitting: Obstetrics and Gynecology

## 2018-02-19 ENCOUNTER — Encounter: Payer: Self-pay | Admitting: Obstetrics and Gynecology

## 2018-02-19 ENCOUNTER — Ambulatory Visit (INDEPENDENT_AMBULATORY_CARE_PROVIDER_SITE_OTHER): Payer: Medicaid Other

## 2018-02-19 VITALS — BP 100/70 | Wt 248.0 lb

## 2018-02-19 DIAGNOSIS — Z3483 Encounter for supervision of other normal pregnancy, third trimester: Secondary | ICD-10-CM

## 2018-02-19 DIAGNOSIS — Z3A39 39 weeks gestation of pregnancy: Secondary | ICD-10-CM | POA: Diagnosis not present

## 2018-02-19 DIAGNOSIS — O0930 Supervision of pregnancy with insufficient antenatal care, unspecified trimester: Secondary | ICD-10-CM

## 2018-02-19 DIAGNOSIS — O0992 Supervision of high risk pregnancy, unspecified, second trimester: Secondary | ICD-10-CM

## 2018-02-19 DIAGNOSIS — O26843 Uterine size-date discrepancy, third trimester: Secondary | ICD-10-CM

## 2018-02-19 DIAGNOSIS — Z98891 History of uterine scar from previous surgery: Secondary | ICD-10-CM

## 2018-02-19 DIAGNOSIS — O0933 Supervision of pregnancy with insufficient antenatal care, third trimester: Secondary | ICD-10-CM

## 2018-02-19 DIAGNOSIS — O99013 Anemia complicating pregnancy, third trimester: Secondary | ICD-10-CM

## 2018-02-19 NOTE — Progress Notes (Signed)
Routine Prenatal Care Visit  Subjective  Laura Lucero is a 27 y.o. [phone] at 5376w4d being seen today for ongoing prenatal care.  She is currently monitored for the following issues for this high-risk pregnancy and has History of cesarean delivery; Late prenatal care; Supervision of high risk pregnancy, antepartum, second trimester; Limited prenatal care in third trimester; Anemia of mother in pregnancy, antepartum, third trimester; and Uterine size date discrepancy pregnancy, third trimester on their problem list.  ----------------------------------------------------------------------------------- Patient reports no complaints.   Contractions: Not present. Vag. Bleeding: None.  Movement: Present. Denies leaking of fluid.  ----------------------------------------------------------------------------------- The following portions of the patient's history were reviewed and updated as appropriate: allergies, current medications, past family history, past medical history, past social history, past surgical history and problem list. Problem list updated.   Objective  Blood pressure 100/70, weight 248 lb (112.5 kg), last menstrual period 05/18/2017, unknown if currently breastfeeding. Pregravid weight 210 lb (95.3 kg) Total Weight Gain 38 lb (17.2 kg) Urinalysis:      Fetal Status:     Movement: Present     General:  Alert, oriented and cooperative. Patient is in no acute distress.  Skin: Skin is warm and dry. No rash noted.   Cardiovascular: Normal heart rate noted  Respiratory: Normal respiratory effort, no problems with respiration noted  Abdomen: Soft, gravid, appropriate for gestational age. Pain/Pressure: Absent     Pelvic:  Cervical exam deferred        Extremities: Normal range of motion.     ental Status: Normal mood and affect. Normal behavior. Normal judgment and thought content.     Assessment   27 y.o. [id number] at 976w4d by  02/22/2018, by Last Menstrual Period presenting  for routine prenatal visit  Plan   pregnancy #4 Problems (from 11/01/17 to present)    Problem Noted Resolved   Anemia of mother in pregnancy, antepartum, third trimester 02/12/2018 by Natale MilchSchuman, Rylie Knierim R, MD No   Uterine size date discrepancy pregnancy, third trimester 02/12/2018 by Natale MilchSchuman, Jerritt Cardoza R, MD No   Late prenatal care 11/01/2017 by Natale MilchSchuman, Indya Oliveria R, MD No   Supervision of high risk pregnancy, antepartum, second trimester 11/01/2017 by Natale MilchSchuman, Payton Moder R, MD No   Overview Addendum 02/12/2018  4:17 PM by Natale MilchSchuman, Huda Petrey R, MD      [facility]inic [facility] Prenatal Labs  Dating  LMP=24 week US Blood type: A/Positive/-- (04/24 1559)   Genetic Screen   NIPS:   Collected 4/24 Antibody:Negative (04/24 1559)  Anatomic US  Rubella: <0.90 (04/24 1559) NONIMMUNE Varicella: NONIMMUNE  GTT 28 wk:  75    RPR: Non Reactive (05/31 1136)   Rhogam Not applicable HBsAg: Negative (04/24 1559)   TDaP vaccine                       HIV: Non Reactive (05/31 1136)   Flu Shot                                GBS:   Contraception  IUD Pap:  CBB     CS/VBAC  Would like TOLAC   Baby Food    Support Person             History of cesarean delivery 03/07/2016 by Conard NovakJackson, Stephen D, MD No   Overview Signed 11/01/2017  5:41 PM by Natale MilchSchuman, Dashayla Theissen R, MD    Desires repeat cesarean [ ]  Review risks  of uterine rupture          Gestational age appropriate obstetric precautions including but not limited to vaginal bleeding, contractions, leaking of fluid and fetal movement were reviewed in detail with the patient.    Anatomy US was not complete.  AFI was normal.  Cesarean planned for tomorrow. Risks discussed.   Return in about 9 days (around 02/28/2018) for postpartum visit.Adelene Idler.  Drezden Seitzinger MD [facility], [facility] 02/19/18 11:53 AM

## 2018-02-20 ENCOUNTER — Encounter
Admission: RE | Admit: 2018-02-20 | Discharge: 2018-02-20 | Disposition: A | Discharge status: Home / Self Care | Payer: Medicaid Other | Source: Ambulatory Visit | Attending: Obstetrics and Gynecology | Admitting: Obstetrics and Gynecology

## 2018-02-20 ENCOUNTER — Other Ambulatory Visit: Payer: Self-pay

## 2018-02-20 LAB — CBC
HCT: 29.2 % — ABNORMAL LOW (ref 35.0–47.0)
Hemoglobin: 9.9 g/dL — ABNORMAL LOW (ref 12.0–16.0)
MCH: 27.8 pg (ref 26.0–34.0)
MCHC: 34 g/dL (ref 32.0–36.0)
MCV: 81.7 fL (ref 80.0–100.0)
Platelets: 282 10*3/uL (ref 150–440)
RBC: 3.58 MIL/uL — AB (ref 3.80–5.20)
RDW: 14.7 % — ABNORMAL HIGH (ref 11.5–14.5)
WBC: 10.2 10*3/uL (ref 3.6–11.0)

## 2018-02-20 LAB — TYPE AND SCREEN
ABO/RH(D): A POS
ANTIBODY SCREEN: NEGATIVE
Extend sample reason: UNDETERMINED

## 2018-02-20 MED ORDER — LACTATED RINGERS IV SOLN
INTRAVENOUS | Status: DC
  Administered 2018-02-21: 08:00:00 via INTRAVENOUS

## 2018-02-20 MED ORDER — GENTAMICIN SULFATE 40 MG/ML IJ SOLN
INTRAMUSCULAR | Status: AC
  Filled 2018-02-20: qty 14

## 2018-02-20 MED ORDER — CLINDAMYCIN PHOSPHATE 900 MG/50ML IV SOLN: 900.0000 mg | Freq: Once | INTRAVENOUS | Status: DC

## 2018-02-20 NOTE — Patient Instructions (Signed)
Your procedure is scheduled on: 02/21/18 Report to [facility] AT 5:30 AM, ENTER THRU THE EMERGENCY DEPARTMENT .  Remember: Instructions that are not followed completely may result in serious medical risk, up to and including death, or upon the discretion of your surgeon and anesthesiologist your surgery may need to be rescheduled.     _X__ 1. Do not eat food after midnight the night before your procedure.                 No gum chewing or hard candies. You may drink clear liquids up to 2 hours                 before you are scheduled to arrive for your surgery- DO not drink clear                 liquids within 2 hours of the start of your surgery.                 Clear Liquids include:  water, apple juice without pulp, clear carbohydrate                 drink such as Clearfast or Gatorade, Black Coffee or Tea (Do not add                 anything to coffee or tea).  __X__2.  On the morning of surgery brush your teeth with toothpaste and water, you                 may rinse your mouth with mouthwash if you wish.  Do not swallow any              toothpaste of mouthwash.     _X__ 3.  No Alcohol for 24 hours before or after surgery.   _X__ 4.  Do Not Smoke or use e-cigarettes For 24 Hours Prior to Your Surgery.                 Do not use any chewable tobacco products for at least 6 hours prior to                 surgery.  ____  5.  Bring all medications with you on the day of surgery if instructed.   __X__  6.  Notify your doctor if there is any change in your medical condition      (cold, fever, infections).     Do not wear jewelry, make-up, hairpins, clips or nail polish. Do not wear lotions, powders, or perfumes.  Do not shave 48 hours prior to surgery. Men may shave face and neck. Do not bring valuables to the hospital.    [facility] for any belongings or valuables.  Contacts, dentures/partials or body piercings may not be worn into surgery. Bring a case for  your contacts, glasses or hearing aids, a denture cup will be supplied. Leave your suitcase in the car. After surgery it may be brought to your room. For patients admitted to the hospital, discharge time is determined by your treatment team.   Patients discharged the day of surgery will not be allowed to drive home.   Please read over the following fact sheets that you were given:   MRSA Information  __X__ Take these medicines the morning of surgery with A SIP OF WATER:    1. NONE  2.   3.   4.  5.  6.  ____ [employer]  Enema (as directed)   __X__ Use CHG Soap/SAGE wipes as directed  ____ Use inhalers on the day of surgery  ____ Stop metformin/Janumet/Farxiga 2 days prior to surgery    ____ Take 1/2 of usual insulin dose the night before surgery. No insulin the morning          of surgery.   ____ Stop Blood Thinners Coumadin/Plavix/Xarelto/Pleta/Pradaxa/Eliquis/Effient/Aspirin  on   Or contact your Surgeon, Cardiologist or Medical Doctor regarding  ability to stop your blood thinners  __X__ Stop Anti-inflammatories 7 days before surgery such as Advil, Ibuprofen, Motrin,  BC or Goodies Powder, Naprosyn, Naproxen, Aleve, Aspirin    __X__ Stop all herbal supplements, fish oil or vitamin E until after surgery.    ____ Bring C-Pap to the hospital.

## 2018-02-21 ENCOUNTER — Encounter
Admission: EM | Disposition: A | Discharge status: Home / Self Care | Payer: Self-pay | Source: Home / Self Care | Attending: Obstetrics and Gynecology

## 2018-02-21 ENCOUNTER — Emergency Department: Payer: Medicaid Other | Admitting: Certified Registered"

## 2018-02-21 ENCOUNTER — Inpatient Hospital Stay
Admission: EM | Admit: 2018-02-21 | Discharge: 2018-02-24 | DRG: 787 | Disposition: A | Discharge status: Home / Self Care | Payer: Medicaid Other | Attending: Obstetrics and Gynecology | Admitting: Obstetrics and Gynecology

## 2018-02-21 ENCOUNTER — Inpatient Hospital Stay
Admission: RE | Admit: 2018-02-21 | Payer: Medicaid Other | Source: Ambulatory Visit | Admitting: Obstetrics and Gynecology

## 2018-02-21 DIAGNOSIS — O365930 Maternal care for other known or suspected poor fetal growth, third trimester, not applicable or unspecified: Secondary | ICD-10-CM | POA: Diagnosis present

## 2018-02-21 DIAGNOSIS — Z3A39 39 weeks gestation of pregnancy: Secondary | ICD-10-CM

## 2018-02-21 DIAGNOSIS — O0930 Supervision of pregnancy with insufficient antenatal care, unspecified trimester: Secondary | ICD-10-CM

## 2018-02-21 DIAGNOSIS — O0933 Supervision of pregnancy with insufficient antenatal care, third trimester: Secondary | ICD-10-CM

## 2018-02-21 DIAGNOSIS — O9081 Anemia of the puerperium: Secondary | ICD-10-CM | POA: Diagnosis not present

## 2018-02-21 DIAGNOSIS — O34219 Maternal care for unspecified type scar from previous cesarean delivery: Secondary | ICD-10-CM

## 2018-02-21 DIAGNOSIS — O99019 Anemia complicating pregnancy, unspecified trimester: Secondary | ICD-10-CM | POA: Diagnosis present

## 2018-02-21 DIAGNOSIS — O34211 Maternal care for low transverse scar from previous cesarean delivery: Principal | ICD-10-CM | POA: Diagnosis present

## 2018-02-21 DIAGNOSIS — Z880 Allergy status to penicillin: Secondary | ICD-10-CM

## 2018-02-21 DIAGNOSIS — O26843 Uterine size-date discrepancy, third trimester: Secondary | ICD-10-CM | POA: Diagnosis not present

## 2018-02-21 DIAGNOSIS — O99824 Streptococcus B carrier state complicating childbirth: Secondary | ICD-10-CM | POA: Diagnosis present

## 2018-02-21 DIAGNOSIS — O99214 Obesity complicating childbirth: Secondary | ICD-10-CM | POA: Diagnosis present

## 2018-02-21 DIAGNOSIS — O9902 Anemia complicating childbirth: Secondary | ICD-10-CM

## 2018-02-21 DIAGNOSIS — D62 Acute posthemorrhagic anemia: Secondary | ICD-10-CM | POA: Diagnosis not present

## 2018-02-21 DIAGNOSIS — O99013 Anemia complicating pregnancy, third trimester: Secondary | ICD-10-CM | POA: Diagnosis present

## 2018-02-21 DIAGNOSIS — Z98891 History of uterine scar from previous surgery: Secondary | ICD-10-CM

## 2018-02-21 DIAGNOSIS — O0992 Supervision of high risk pregnancy, unspecified, second trimester: Secondary | ICD-10-CM

## 2018-02-21 SURGERY — Surgical Case
Anesthesia: Spinal | Site: Abdomen | Wound class: Clean Contaminated

## 2018-02-21 MED ORDER — TETANUS-DIPHTH-ACELL PERTUSSIS 5-2.5-18.5 LF-MCG/0.5 IM SUSP: 0.5000 mL | Freq: Once | INTRAMUSCULAR | Status: DC

## 2018-02-21 MED ORDER — CLINDAMYCIN PHOSPHATE 900 MG/50ML IV SOLN
900.0000 mg | Freq: Once | INTRAVENOUS | Status: AC
  Administered 2018-02-21: 900 mg via INTRAVENOUS
  Filled 2018-02-21: qty 50

## 2018-02-21 MED ORDER — LIDOCAINE HCL (PF) 1 % IJ SOLN
INTRAMUSCULAR | Status: DC | PRN
  Administered 2018-02-21: 3 mL via SUBCUTANEOUS

## 2018-02-21 MED ORDER — GENTAMICIN SULFATE 40 MG/ML IJ SOLN
INTRAVENOUS | Status: AC
  Administered 2018-02-21: 562.5 mg via INTRAVENOUS
  Filled 2018-02-21: qty 14

## 2018-02-21 MED ORDER — GLYCOPYRROLATE 0.2 MG/ML IJ SOLN
INTRAMUSCULAR | Status: AC
  Filled 2018-02-21: qty 1

## 2018-02-21 MED ORDER — KETOROLAC TROMETHAMINE 30 MG/ML IJ SOLN
INTRAMUSCULAR | Status: AC
  Filled 2018-02-21: qty 1

## 2018-02-21 MED ORDER — IBUPROFEN 600 MG PO TABS: 600.0000 mg | ORAL_TABLET | Freq: Four times a day (QID) | ORAL | Status: DC

## 2018-02-21 MED ORDER — FERROUS SULFATE 325 (65 FE) MG PO TABS
325.0000 mg | ORAL_TABLET | Freq: Two times a day (BID) | ORAL | Status: DC
  Administered 2018-02-21 – 2018-02-24 (×7): 325 mg via ORAL
  Filled 2018-02-21 (×7): qty 1

## 2018-02-21 MED ORDER — OXYTOCIN 40 UNITS IN LACTATED RINGERS INFUSION - SIMPLE MED
INTRAVENOUS | Status: AC
  Administered 2018-02-21: 2.5 [IU]/h via INTRAVENOUS
  Filled 2018-02-21: qty 1000

## 2018-02-21 MED ORDER — ONDANSETRON HCL 4 MG/2ML IJ SOLN
INTRAMUSCULAR | Status: AC
  Filled 2018-02-21: qty 2

## 2018-02-21 MED ORDER — OXYTOCIN 40 UNITS IN LACTATED RINGERS INFUSION - SIMPLE MED
INTRAVENOUS | Status: DC | PRN
  Administered 2018-02-21: 900 mL via INTRAVENOUS

## 2018-02-21 MED ORDER — SIMETHICONE 80 MG PO CHEW: 80.0000 mg | CHEWABLE_TABLET | ORAL | Status: DC | PRN

## 2018-02-21 MED ORDER — NALOXONE HCL 4 MG/10ML IJ SOLN
1.0000 ug/kg/h | INTRAMUSCULAR | Status: DC | PRN
  Filled 2018-02-21: qty 5

## 2018-02-21 MED ORDER — ACETAMINOPHEN 325 MG PO TABS: 325.0000 mg | ORAL_TABLET | ORAL | Status: DC | PRN

## 2018-02-21 MED ORDER — ACETAMINOPHEN 160 MG/5ML PO SOLN
325.0000 mg | ORAL | Status: DC | PRN
  Filled 2018-02-21: qty 20.3

## 2018-02-21 MED ORDER — BISACODYL 10 MG RE SUPP: 10.0000 mg | Freq: Every day | RECTAL | Status: DC | PRN

## 2018-02-21 MED ORDER — OXYTOCIN 40 UNITS IN LACTATED RINGERS INFUSION - SIMPLE MED
2.5000 [IU]/h | INTRAVENOUS | Status: AC
  Administered 2018-02-21: 2.5 [IU]/h via INTRAVENOUS

## 2018-02-21 MED ORDER — COCONUT OIL OIL: 1.0000 "application " | TOPICAL_OIL | Status: DC | PRN

## 2018-02-21 MED ORDER — GLYCOPYRROLATE 0.2 MG/ML IJ SOLN
INTRAMUSCULAR | Status: DC | PRN
  Administered 2018-02-21: 0.2 mg via INTRAVENOUS

## 2018-02-21 MED ORDER — ACETAMINOPHEN 10 MG/ML IV SOLN
INTRAVENOUS | Status: DC | PRN
  Administered 2018-02-21: 1000 mg via INTRAVENOUS

## 2018-02-21 MED ORDER — MORPHINE SULFATE (PF) 0.5 MG/ML IJ SOLN
INTRAMUSCULAR | Status: AC
  Filled 2018-02-21: qty 10

## 2018-02-21 MED ORDER — IBUPROFEN 600 MG PO TABS
600.0000 mg | ORAL_TABLET | Freq: Four times a day (QID) | ORAL | Status: DC
  Administered 2018-02-21 – 2018-02-24 (×12): 600 mg via ORAL
  Filled 2018-02-21 (×12): qty 1

## 2018-02-21 MED ORDER — SOD CITRATE-CITRIC ACID 500-334 MG/5ML PO SOLN
ORAL | Status: AC
  Administered 2018-02-21: 30 mL
  Filled 2018-02-21: qty 15

## 2018-02-21 MED ORDER — DIBUCAINE 1 % RE OINT: 1.0000 "application " | TOPICAL_OINTMENT | RECTAL | Status: DC | PRN

## 2018-02-21 MED ORDER — KETOROLAC TROMETHAMINE 30 MG/ML IJ SOLN
INTRAMUSCULAR | Status: DC | PRN
  Administered 2018-02-21: 30 mg via INTRAVENOUS

## 2018-02-21 MED ORDER — MORPHINE SULFATE (PF) 0.5 MG/ML IJ SOLN
INTRAMUSCULAR | Status: DC | PRN
  Administered 2018-02-21: .2 mg via INTRATHECAL

## 2018-02-21 MED ORDER — MEDROXYPROGESTERONE ACETATE 150 MG/ML IM SUSP
150.0000 mg | Freq: Once | INTRAMUSCULAR | Status: AC
  Administered 2018-02-24: 150 mg via INTRAMUSCULAR
  Filled 2018-02-21 (×2): qty 1

## 2018-02-21 MED ORDER — KETOROLAC TROMETHAMINE 30 MG/ML IJ SOLN: 30.0000 mg | Freq: Once | INTRAMUSCULAR | Status: AC | PRN

## 2018-02-21 MED ORDER — DEXAMETHASONE SODIUM PHOSPHATE 10 MG/ML IJ SOLN
INTRAMUSCULAR | Status: DC | PRN
  Administered 2018-02-21: 10 mg via INTRAVENOUS

## 2018-02-21 MED ORDER — OXYCODONE-ACETAMINOPHEN 5-325 MG PO TABS
1.0000 | ORAL_TABLET | ORAL | Status: DC | PRN
  Administered 2018-02-23 – 2018-02-24 (×3): 1 via ORAL
  Filled 2018-02-21 (×4): qty 1

## 2018-02-21 MED ORDER — BUPIVACAINE IN DEXTROSE 0.75-8.25 % IT SOLN
INTRATHECAL | Status: DC | PRN
  Administered 2018-02-21: 1.6 mL via INTRATHECAL

## 2018-02-21 MED ORDER — OXYCODONE-ACETAMINOPHEN 5-325 MG PO TABS: 2.0000 | ORAL_TABLET | ORAL | Status: DC | PRN

## 2018-02-21 MED ORDER — SODIUM CHLORIDE 0.9 % IV SOLN
INTRAVENOUS | Status: DC | PRN
  Administered 2018-02-21: 25 ug/min via INTRAVENOUS

## 2018-02-21 MED ORDER — WITCH HAZEL-GLYCERIN EX PADS: 1.0000 "application " | MEDICATED_PAD | CUTANEOUS | Status: DC | PRN

## 2018-02-21 MED ORDER — ZOLPIDEM TARTRATE 5 MG PO TABS: 5.0000 mg | ORAL_TABLET | Freq: Every evening | ORAL | Status: DC | PRN

## 2018-02-21 MED ORDER — NALBUPHINE HCL 10 MG/ML IJ SOLN: 5.0000 mg | INTRAMUSCULAR | Status: DC | PRN

## 2018-02-21 MED ORDER — NALBUPHINE HCL 10 MG/ML IJ SOLN: 5.0000 mg | Freq: Once | INTRAMUSCULAR | Status: AC | PRN

## 2018-02-21 MED ORDER — LACTATED RINGERS IV SOLN
INTRAVENOUS | Status: DC
  Administered 2018-02-22: 02:00:00 via INTRAVENOUS

## 2018-02-21 MED ORDER — FLEET ENEMA 7-19 GM/118ML RE ENEM: 1.0000 | ENEMA | Freq: Every day | RECTAL | Status: DC | PRN

## 2018-02-21 MED ORDER — NALBUPHINE HCL 10 MG/ML IJ SOLN
5.0000 mg | Freq: Once | INTRAMUSCULAR | Status: AC | PRN
  Administered 2018-02-21: 5 mg via SUBCUTANEOUS

## 2018-02-21 MED ORDER — ACETAMINOPHEN 325 MG PO TABS
650.0000 mg | ORAL_TABLET | ORAL | Status: DC | PRN
Start: 2018-02-21 — End: 2018-02-24
  Administered 2018-02-23 (×2): 650 mg via ORAL
  Filled 2018-02-21 (×2): qty 2

## 2018-02-21 MED ORDER — PHENYLEPHRINE HCL 10 MG/ML IJ SOLN
INTRAMUSCULAR | Status: AC
  Filled 2018-02-21: qty 1

## 2018-02-21 MED ORDER — ACETAMINOPHEN 10 MG/ML IV SOLN
INTRAVENOUS | Status: AC
  Filled 2018-02-21: qty 100

## 2018-02-21 MED ORDER — DIPHENHYDRAMINE HCL 25 MG PO CAPS: 25.0000 mg | ORAL_CAPSULE | Freq: Four times a day (QID) | ORAL | Status: DC | PRN

## 2018-02-21 MED ORDER — OXYTOCIN 10 UNIT/ML IJ SOLN
INTRAMUSCULAR | Status: AC
  Filled 2018-02-21: qty 4

## 2018-02-21 MED ORDER — MENTHOL 3 MG MT LOZG
1.0000 | LOZENGE | OROMUCOSAL | Status: DC | PRN
  Filled 2018-02-21: qty 9

## 2018-02-21 MED ORDER — SCOPOLAMINE 1 MG/3DAYS TD PT72: 1.0000 | MEDICATED_PATCH | Freq: Once | TRANSDERMAL | Status: DC

## 2018-02-21 MED ORDER — DIPHENHYDRAMINE HCL 50 MG/ML IJ SOLN
INTRAMUSCULAR | Status: DC | PRN
  Administered 2018-02-21: 25 mg via INTRAVENOUS

## 2018-02-21 MED ORDER — DIPHENHYDRAMINE HCL 50 MG/ML IJ SOLN: 12.5000 mg | INTRAMUSCULAR | Status: DC | PRN

## 2018-02-21 MED ORDER — DIPHENHYDRAMINE HCL 25 MG PO CAPS
25.0000 mg | ORAL_CAPSULE | ORAL | Status: DC | PRN
  Administered 2018-02-21 (×2): 25 mg via ORAL
  Filled 2018-02-21 (×2): qty 1

## 2018-02-21 MED ORDER — LACTATED RINGERS IV SOLN
INTRAVENOUS | Status: DC
  Administered 2018-02-21: 08:00:00 via INTRAVENOUS

## 2018-02-21 MED ORDER — NALOXONE HCL 0.4 MG/ML IJ SOLN: 0.4000 mg | INTRAMUSCULAR | Status: DC | PRN

## 2018-02-21 MED ORDER — BUPIVACAINE HCL (PF) 0.5 % IJ SOLN
INTRAMUSCULAR | Status: AC
  Filled 2018-02-21: qty 30

## 2018-02-21 MED ORDER — BUPIVACAINE HCL 0.5 % IJ SOLN
INTRAMUSCULAR | Status: DC | PRN
  Administered 2018-02-21: 10 mL

## 2018-02-21 MED ORDER — MEASLES, MUMPS & RUBELLA VAC ~~LOC~~ INJ
0.5000 mL | INJECTION | Freq: Once | SUBCUTANEOUS | Status: AC
  Administered 2018-02-24: 0.5 mL via SUBCUTANEOUS
  Filled 2018-02-21 (×2): qty 0.5

## 2018-02-21 MED ORDER — SIMETHICONE 80 MG PO CHEW: 80.0000 mg | CHEWABLE_TABLET | ORAL | Status: DC

## 2018-02-21 MED ORDER — ONDANSETRON HCL 4 MG/2ML IJ SOLN: 4.0000 mg | Freq: Three times a day (TID) | INTRAMUSCULAR | Status: DC | PRN

## 2018-02-21 MED ORDER — PRENATAL MULTIVITAMIN CH
1.0000 | ORAL_TABLET | Freq: Every day | ORAL | Status: DC
  Administered 2018-02-22 – 2018-02-24 (×3): 1 via ORAL
  Filled 2018-02-21 (×3): qty 1

## 2018-02-21 MED ORDER — BUPIVACAINE 0.25 % ON-Q PUMP DUAL CATH 400 ML
400.0000 mL | INJECTION | Status: DC
  Filled 2018-02-21: qty 400

## 2018-02-21 MED ORDER — DEXAMETHASONE SODIUM PHOSPHATE 10 MG/ML IJ SOLN
INTRAMUSCULAR | Status: AC
  Filled 2018-02-21: qty 1

## 2018-02-21 MED ORDER — NALBUPHINE HCL 10 MG/ML IJ SOLN
INTRAMUSCULAR | Status: AC
  Filled 2018-02-21: qty 1

## 2018-02-21 MED ORDER — VARICELLA VIRUS VACCINE LIVE 1350 PFU/0.5ML IJ SUSR
0.5000 mL | Freq: Once | INTRAMUSCULAR | Status: AC
  Administered 2018-02-24: 0.5 mL via SUBCUTANEOUS
  Filled 2018-02-21 (×2): qty 0.5

## 2018-02-21 MED ORDER — ONDANSETRON HCL 4 MG/2ML IJ SOLN
INTRAMUSCULAR | Status: DC | PRN
  Administered 2018-02-21: 4 mg via INTRAVENOUS

## 2018-02-21 MED ORDER — SODIUM CHLORIDE 0.9% FLUSH: 3.0000 mL | INTRAVENOUS | Status: DC | PRN

## 2018-02-21 MED ORDER — SENNOSIDES-DOCUSATE SODIUM 8.6-50 MG PO TABS
2.0000 | ORAL_TABLET | ORAL | Status: DC
  Administered 2018-02-22 – 2018-02-24 (×3): 2 via ORAL
  Filled 2018-02-21 (×3): qty 2

## 2018-02-21 MED ORDER — DIPHENHYDRAMINE HCL 50 MG/ML IJ SOLN
INTRAMUSCULAR | Status: AC
Start: 2018-02-21 — End: ?
  Filled 2018-02-21: qty 1

## 2018-02-21 MED ORDER — SIMETHICONE 80 MG PO CHEW
80.0000 mg | CHEWABLE_TABLET | Freq: Three times a day (TID) | ORAL | Status: DC
  Administered 2018-02-21 – 2018-02-24 (×10): 80 mg via ORAL
  Filled 2018-02-21 (×9): qty 1

## 2018-02-21 MED ORDER — PROMETHAZINE HCL 25 MG/ML IJ SOLN: 6.2500 mg | INTRAMUSCULAR | Status: DC | PRN

## 2018-02-21 SURGICAL SUPPLY — 24 items
CANISTER SUCT 3000ML PPV (MISCELLANEOUS) ×2 IMPLANT
CATH KIT ON-Q SILVERSOAK 5IN (CATHETERS) ×4 IMPLANT
CHLORAPREP W/TINT 26ML (MISCELLANEOUS) ×4 IMPLANT
DERMABOND ADVANCED (GAUZE/BANDAGES/DRESSINGS) ×1
DERMABOND ADVANCED .7 DNX12 (GAUZE/BANDAGES/DRESSINGS) ×1 IMPLANT
DRSG OPSITE POSTOP 4X10 (GAUZE/BANDAGES/DRESSINGS) ×2 IMPLANT
ELECT CAUTERY BLADE 6.4 (BLADE) ×2 IMPLANT
ELECT REM PT RETURN 9FT ADLT (ELECTROSURGICAL) ×2
ELECTRODE REM PT RTRN 9FT ADLT (ELECTROSURGICAL) ×1 IMPLANT
GLOVE BIOGEL PI IND STRL 6.5 (GLOVE) ×1 IMPLANT
GLOVE BIOGEL PI INDICATOR 6.5 (GLOVE) ×1
GOWN STRL REUS W/ TWL LRG LVL3 (GOWN DISPOSABLE) ×1 IMPLANT
GOWN STRL REUS W/ TWL XL LVL3 (GOWN DISPOSABLE) ×2 IMPLANT
GOWN STRL REUS W/TWL LRG LVL3 (GOWN DISPOSABLE) ×1
GOWN STRL REUS W/TWL XL LVL3 (GOWN DISPOSABLE) ×2
NS IRRIG 1000ML POUR BTL (IV SOLUTION) ×2 IMPLANT
PACK C SECTION AR (MISCELLANEOUS) ×2 IMPLANT
PAD OB MATERNITY 4.3X12.25 (PERSONAL CARE ITEMS) ×2 IMPLANT
PAD PREP 24X41 OB/GYN DISP (PERSONAL CARE ITEMS) ×2 IMPLANT
SUT MNCRL AB 4-0 PS2 18 (SUTURE) ×2 IMPLANT
SUT PLAIN 3-0 (SUTURE) ×2 IMPLANT
SUT VIC AB 0 CT1 36 (SUTURE) ×6 IMPLANT
SUT VIC AB 2-0 CT1 36 (SUTURE) ×2 IMPLANT
SYR 30ML LL (SYRINGE) ×4 IMPLANT

## 2018-02-21 NOTE — Anesthesia Postprocedure Evaluation (Signed)
Anesthesia Post Note  Patient: Laura Lucero  Procedure(s) Performed: CESAREAN SECTION (N/A Abdomen)  Patient location during evaluation: L&D Anesthesia Type: Spinal Level of consciousness: oriented and awake and alert Pain management: pain level controlled Vital Signs Assessment: post-procedure vital signs reviewed and stable Respiratory status: spontaneous breathing, respiratory function stable and patient connected to nasal cannula oxygen Cardiovascular status: blood pressure returned to baseline and stable Postop Assessment: no headache, no backache and no apparent nausea or vomiting Anesthetic complications: no     Last Vitals:  Vitals:   02/21/18 1232 02/21/18 1303  BP: 109/72 106/63  Pulse: (!) 110 63  Resp:    Temp:    SpO2:      Last Pain:  Vitals:   02/21/18 1200  TempSrc:   PainSc: 0-No pain                 Christia ReadingScott T Jelina Paulsen

## 2018-02-21 NOTE — Transfer of Care (Signed)
Immediate Anesthesia Transfer of Care Note  Patient: Laura Lucero  Procedure(s) Performed: CESAREAN SECTION (N/A Abdomen)  Patient Location: PACU and Mother/Baby  Anesthesia Type:Spinal  Level of Consciousness: awake, alert , oriented and patient cooperative  Airway & Oxygen Therapy: Patient Spontanous Breathing  Post-op Assessment: Report given to RN, Post -op Vital signs reviewed and stable and Patient moving all extremities  Post vital signs: Reviewed and stable  Last Vitals:  Vitals Value Taken Time  BP 110/70 02/21/2018 10:20 AM  Temp 36.1 C 02/21/2018 10:20 AM  Pulse 69 02/21/2018 10:20 AM  Resp 11 02/21/2018 10:20 AM  SpO2 100 % 02/21/2018 10:20 AM    Last Pain:  Vitals:   02/21/18 1020  TempSrc: Axillary  PainSc: 0-No pain         Complications: No apparent anesthesia complications

## 2018-02-21 NOTE — Discharge Summary (Signed)
OB Discharge Summary     Patient Name: Laura Lucero DOB: 05/23/1991 MRN: [id number]  Date of admission: 02/21/2018 Delivering MD: Natale Milchhristanna R Schuman, MD  Date of Delivery: 02/21/2018  Date of discharge: 02/24/2018  Admitting diagnosis: Late prenatal care [O09.30] History of cesarean delivery [Z98.891] Anemia of mother in pregnancy, antepartum, third trimester [O99.013] Uterine size date discrepancy pregnancy, third trimester [O26.843] Supervision of high risk pregnancy, antepartum, second trimester [O09.92] Intrauterine pregnancy: 550w6d     Secondary diagnosis: Anemia     Discharge diagnosis: Term Pregnancy Delivered, Anemia                         Hospital course:  Sceduled C/S   27 y.o. yo [id number] at 359w6d was admitted to the hospital 02/21/2018 for scheduled cesarean section with the following indication:Elective Repeat.  Membrane Rupture Time/Date: 9:09 AM ,02/21/2018   Patient delivered a Viable infant.02/21/2018  Details of operation can be found in separate operative note.  Pateint had an uncomplicated postpartum course.  She is ambulating, tolerating a regular diet, passing flatus, and urinating well. Patient is discharged home in stable condition on  02/24/18                                                                        Post partum procedures: none  Complications: None  Physical exam on 02/24/2018: Vitals:   02/23/18 1929 02/23/18 2325 02/24/18 0417 02/24/18 0744  BP: 127/78 122/87 123/75 112/73  Pulse: 75 86 79 72  Resp: 18 18 18 18   Temp: 99 F (37.2 C) 98.7 F (37.1 C) 98.1 F (36.7 C) 98.4 F (36.9 C)  TempSrc: Oral Oral Oral Oral  SpO2: 100% 95% 100% 99%  Weight:      Height:       General: alert, cooperative and no distress Lochia: appropriate Uterine Fundus: firm Incision: Dressing is clean, dry, and intact DVT Evaluation: No evidence of DVT seen on physical exam.  Labs: Lab Results  Component Value Date   WBC 10.7 02/22/2018   HGB 7.9  (L) 02/22/2018   HCT 23.3 (L) 02/22/2018   MCV 81.8 02/22/2018   PLT 278 02/22/2018   CMP Latest Ref Rng & Units 12/07/2016  Glucose 65 - 99 mg/dL 85  BUN 6 - 20 mg/dL 11  Creatinine 1.470.44 - 8.291.00 mg/dL 5.620.59  Sodium 130135 - 865145 mmol/L 139  Potassium 3.5 - 5.1 mmol/L 3.5  Chloride 101 - 111 mmol/L 108  CO2 22 - 32 mmol/L 23  Calcium 8.9 - 10.3 mg/dL 9.4  Total Protein 6.5 - 8.1 g/dL -  Total Bilirubin 0.3 - 1.2 mg/dL -  Alkaline Phos 38 - 784126 U/L -  AST 15 - 41 U/L -  ALT 14 - 54 U/L -    Discharge instruction: per After Visit Summary.  Medications:  Allergies as of 02/24/2018      Reactions   Ampicillin Anaphylaxis   Has patient had a PCN reaction causing immediate rash, facial/tongue/throat swelling, SOB or lightheadedness with hypotension: Yes Has patient had a PCN reaction causing severe rash involving mucus membranes or skin necrosis: No Has patient had a PCN reaction that required hospitalization: No Has patient had a PCN  reaction occurring within the last 10 years: Yes If all of the above answers are "NO", then may proceed with Cephalosporin use. Throat swelling      Medication List    TAKE these medications   oxyCODONE-acetaminophen 5-325 MG tablet Commonly known as:  PERCOCET/ROXICET Take 1-2 tablets by mouth every 6 (six) hours as needed.            Discharge Care Instructions  (From admission, onward)         Start     Ordered   02/24/18 0000  Discharge wound care:    Comments:  You may apply a light dressing for minor discharge from the incision or to keep waistbands of clothing from rubbing.  You may also have been discharge with a clear dressing in which case this will be removed at your postoperative clinic visit.  You may shower, use soap on your incision.  Avoid baths or soaking the incision in the first 6 weeks following your surgery.Marland Kitchen.   02/24/18 1000          Diet: routine diet  Activity: Advance as tolerated. Pelvic rest for 6 weeks.    Outpatient follow up: Follow-up Information    Schuman, Jaquelyn Bitterhristanna R, MD. Schedule an appointment as soon as possible for a visit in 1 week.   Specialty:  Obstetrics and Gynecology Contact information: [address] [place] [address] [phone]             Postpartum contraception: Depo Provera Rhogam Given postpartum: no Rubella vaccine given postpartum: yes Varicella vaccine given postpartum: yes TDaP given antepartum or postpartum: Yes  Newborn Data: Live born female  Birth Weight: 5 lb 14.5 oz (2680 g) APGAR: 8, 9  Newborn Delivery   Birth date/time:  02/21/2018 09:09:00 Delivery type:  C-Section, Low Transverse C-section categorization:  Repeat      Baby Feeding: Formula  Disposition: home with mother  SIGNED: Marcelyn BruinsJacelyn Ashtin Melichar, CNM 02/24/2018

## 2018-02-21 NOTE — Anesthesia Post-op Follow-up Note (Signed)
Anesthesia QCDR form completed.        

## 2018-02-21 NOTE — Anesthesia Preprocedure Evaluation (Signed)
Anesthesia Evaluation  Patient identified by MRN, date of birth, ID band Patient awake    Reviewed: Allergy & Precautions, H&P , NPO status , Patient's Chart, lab work & pertinent test results, reviewed documented beta blocker date and time   Airway Mallampati: III  TM Distance: >3 FB Neck ROM: full    Dental  (+) Upper Dentures   Pulmonary neg pulmonary ROS,    Pulmonary exam normal breath sounds clear to auscultation       Cardiovascular Exercise Tolerance: Good negative cardio ROS   Rhythm:regular Rate:Normal     Neuro/Psych negative neurological ROS  negative psych ROS   GI/Hepatic negative GI ROS, Neg liver ROS, neg GERD  ,  Endo/Other  Morbid obesity  Renal/GU negative Renal ROS  negative genitourinary   Musculoskeletal   Abdominal   Peds  Hematology negative hematology ROS (+) anemia ,   Anesthesia Other Findings Past Medical History: No date: Anemia  Past Surgical History: 2015: CESAREAN SECTION 03/07/2016: CESAREAN SECTION; N/A     Comment:  Procedure: CESAREAN SECTION;  Surgeon: Conard NovakStephen D               Jackson, MD;  Location: [facility] ORS;  Service: Obstetrics;                Laterality: N/A; No date: CYST REMOVAL NECK  BMI    Body Mass Index:  43.93 kg/m      Reproductive/Obstetrics                             Anesthesia Physical Anesthesia Plan  ASA: III  Anesthesia Plan: Spinal   Post-op Pain Management:    Induction:   PONV Risk Score and Plan: 3 and Treatment may vary due to age or medical condition and Ondansetron  Airway Management Planned:   Additional Equipment:   Intra-op Plan:   Post-operative Plan:   Informed Consent: I have reviewed the patients History and Physical, chart, labs and discussed the procedure including the risks, benefits and alternatives for the proposed anesthesia with the patient or authorized representative who has indicated  his/her understanding and acceptance.   Dental Advisory Given  Plan Discussed with: CRNA  Anesthesia Plan Comments:         Anesthesia Quick Evaluation

## 2018-02-21 NOTE — Consult Note (Signed)
Neonatology Note:   Attendance at C-section:    I was asked by Dr. Schuman to attend this repeat C/S at term. The mother is a G5P3A1 A pos, Rubella NI, GBS positive with limited PNC; UDS negative 8/5. ROM at delivery, fluid clear. Infant fairly vigorous with good spontaneous cry and tone. Delayed cord clamping was done for 40 seconds. Needed no suctioning. Ap 8/9. Lungs clear to ausc in DR, cry noted to be raspy, but improving over time. PE remarkable for superficial red line extending from anus to vaginal opening; anus is patent and has normal wink. Infant also is somewhat thin, almost post-dates in appearance. Infant is able to remain with her mother for skin to skin time under nursing supervision. Transferred to the care of Pediatrician.   Izreal Kock C. Shayma Pfefferle, MD 

## 2018-02-21 NOTE — Op Note (Signed)
Cesarean Section Procedure Note 02/21/18  Pre-operative Diagnosis:  1. 27 yo G5P4014 at 1738w6d  2. history of prior low transverse cesarean deliveries, desired repeat cesarean  3. Limited prenatal care  4. Uterine size date discrepancy  5. Maternal anemia Post-operative Diagnosis:  1. 27 yo G5P4014 at 3638w6d  2. history of prior low transverse cesarean deliveries, desired repeat cesarean  3. Limited prenatal care  4. Uterine size date discrepancy  5. Maternal anemia  6. Small for gestational age infant  Procedure: Repeat Low Transverse Cesarean Section Surgeon: Adelene Idlerhristanna Schuman MD   Assistant(s): Maryruth EveJaci Schmid CNM- No other skilled surgical assistant available. Anesthesia: Spinal Estimated Blood Loss: 500cc Complications: None; patient tolerated the procedure well.   Disposition: PACU - hemodynamically stable. Condition: stable   Findings: A female infant in the cephalic presentation. Amniotic fluid - clear   Birth weight: 5 lbs 15oz Apgars of 8 and 9.  Intact placenta with a three-vessel cord. Grossly normal uterus, tubes and ovaries bilaterally. No intraabdominal adhesions were noted.   Procedure Details    The patient was taken to operating room, identified as the correct patient and the procedure verified as C-Section Delivery. A time out was held and the above information confirmed. After induction of anesthesia, the patient was draped and prepped in the usual sterile manner. A Pfannenstiel incision was made and carried down through the subcutaneous tissue to the fascia. Fascial incision was made and extended transversely with the Mayo scissors. The fascia was separated from the underlying rectus tissue superiorly and inferiorly. The peritoneum was identified and entered bluntly. Peritoneal incision was extended longitudinally. A low transverse hysterotomy was made. The fetus was delivered atraumatically. The umbilical cord was clamped x2 and cut and the infant was handed to the  awaiting pediatricians. The placenta was removed intact and appeared normal with a 3-vessel cord. The uterus was exteriorized and cleared of all clot and debris. The hysterotomy was closed with running sutures of 0 Vicryl suture. A second imbricating layer was placed with 0-chromic.  Excellent hemostasis was observed. The uterus was returned to the abdomen. The pelvis was irrigated and again, excellent hemostasis was noted. The peritoneum was closed with a running stitch of 2-0 Vicryl. The On Q Pain pump System was then placed.  Trocars were placed through the abdominal wall into the subfascial space and these were used to thread the silver soaker cathaters into place.The rectus muscles were inspected and were hemostatic. The rectus fascia was then reapproximated with running sutures of 0-vicryl, with careful placement not to incorporate the cathaters. Subcutaneous tissues are then irrigated with saline and hemostasis assured with the bovie. The subcutaneous fat was approximated with 3-0 plain and a running stitch.  Skin was then closed with 4-0 monocryl suture in a subcuticular fashion followed by skin adhesive. The cathaters are flushed each with 5 mL of Bupivicaine and stabilized into place with dressing. Instrument, sponge, and needle counts were correct prior to the abdominal closure and at the conclusion of the case.  The patient tolerated the procedure well and was transferred to the recovery room in stable condition.   Natale Milchhristanna R Schuman MD [facility], [facility] 02/21/18 10:10 AM

## 2018-02-21 NOTE — Anesthesia Procedure Notes (Addendum)
Spinal  Patient location during procedure: OB Start time: 02/21/2018 8:35 AM End time: 02/21/2018 8:45 AM Staffing Anesthesiologist: Christia ReadingHowell, Scott T, MD Resident/CRNA: Sherol DadeMacMang, Hildred Mollica H, CRNA Performed: resident/CRNA  Preanesthetic Checklist Completed: patient identified, site marked, surgical consent, pre-op evaluation, timeout performed, IV checked, risks and benefits discussed and monitors and equipment checked Spinal Block Patient position: sitting Prep: ChloraPrep Patient monitoring: heart rate, continuous pulse ox and blood pressure Approach: midline Location: L3-4 Injection technique: single-shot Needle Needle type: Pencan and Introducer  Needle gauge: 24 G Assessment Sensory level: T3 Additional Notes Pt tolerated procedure well; monitors and O2 placed, timeout done prior to spinal. See flowsheet for medication doses.

## 2018-02-22 ENCOUNTER — Encounter: Payer: Self-pay | Admitting: Certified Nurse Midwife

## 2018-02-22 LAB — CBC
HEMATOCRIT: 23.3 % — AB (ref 35.0–47.0)
Hemoglobin: 7.9 g/dL — ABNORMAL LOW (ref 12.0–16.0)
MCH: 27.6 pg (ref 26.0–34.0)
MCHC: 33.8 g/dL (ref 32.0–36.0)
MCV: 81.8 fL (ref 80.0–100.0)
Platelets: 278 10*3/uL (ref 150–440)
RBC: 2.85 MIL/uL — ABNORMAL LOW (ref 3.80–5.20)
RDW: 14.8 % — ABNORMAL HIGH (ref 11.5–14.5)
WBC: 10.7 10*3/uL (ref 3.6–11.0)

## 2018-02-22 MED ORDER — VITAMIN C 500 MG PO TABS
500.0000 mg | ORAL_TABLET | Freq: Two times a day (BID) | ORAL | Status: DC
  Administered 2018-02-22 – 2018-02-24 (×4): 500 mg via ORAL
  Filled 2018-02-22 (×4): qty 1

## 2018-02-22 NOTE — Progress Notes (Signed)
POD #1 s/p repeat Cesarean section Subjective:   Sitting up eating lunch. Has not voided or been OOB since foley removed at 0500 this morning. Tolerating regular diet. Bleeding appropriately  Objective:  Blood pressure 97/68, pulse 66, temperature 98.4 F (36.9 C), temperature source Oral, resp. rate 20, height 5\' 3"  (1.6 m), weight 112.5 kg, last menstrual period 05/18/2017, SpO2 99 %, unknown if currently breastfeeding.  General: BF in NAD Heart RRR without murmur Pulmonary: no increased work of breathing/ CTAB Abdomen: BS present x 4, non-distended, non-tender, fundus firm at level of umbilicus-1 Incision: Honey comb dressing C+D+I, ON Q intact Extremities: trace edema, no erythema, no tenderness  Results for orders placed or performed during the hospital encounter of 02/21/18 (from the past 72 hour(s))  CBC     Status: Abnormal   Collection Time: 02/22/18  4:11 AM  Result Value Ref Range   WBC 10.7 3.6 - 11.0 K/uL   RBC 2.85 (L) 3.80 - 5.20 MIL/uL   Hemoglobin 7.9 (L) 12.0 - 16.0 g/dL    Comment: RESULT REPEATED AND VERIFIED   HCT 23.3 (L) 35.0 - 47.0 %   MCV 81.8 80.0 - 100.0 fL   MCH 27.6 26.0 - 34.0 pg   MCHC 33.8 32.0 - 36.0 g/dL   RDW 16.114.8 (H) 09.611.5 - 04.514.5 %   Platelets 278 150 - 440 K/uL    Comment: Performed at [facility], [address] [place], [place] [address]     Assessment:   27 y.o. [id number] postoperativeday # 1 s/p repeat Cesarean section   Plan:  1) Chronic anemia worsened with acute blood loss - hemodynamically stable  - po ferrous sulfate and vitamins  2) A POS/ RNI/ VNI-need vaccine postpartum  3) TDAP status   4) Breast/Bottle/Contraception  5) Disposition

## 2018-02-23 NOTE — Progress Notes (Signed)
POD #2 s/p repeat Cesarean section Subjective:   Tolerating regular diet.  Passing flatus. Bleeding appropriately. Voiding without difficulty. Has some lower abdominal pain when bladder fills. Baby having some problems with bottle feedings.   Objective:  Blood pressure 97/68, pulse 66, temperature 98.4 F (36.9 C), temperature source Oral, resp. rate 20, height 5\' 3"  (1.6 m), weight 112.5 kg, last menstrual period 05/18/2017, SpO2 99 %, unknown if currently breastfeeding.  General: BF in NAD, two other children in room with her. Heart RRR without murmur Pulmonary: no increased work of breathing/ CTAB Abdomen: BS present x 4, non-distended, non-tender Incision: Honey comb dressing C+D+I, ON Q intact-On-Q dressing was reinforced and is dry. Extremities: trace edema, no erythema, no tenderness  Results for orders placed or performed during the hospital encounter of 02/21/18 (from the past 72 hour(s))  CBC     Status: Abnormal   Collection Time: 02/22/18  4:11 AM  Result Value Ref Range   WBC 10.7 3.6 - 11.0 K/uL   RBC 2.85 (L) 3.80 - 5.20 MIL/uL   Hemoglobin 7.9 (L) 12.0 - 16.0 g/dL    Comment: RESULT REPEATED AND VERIFIED   HCT 23.3 (L) 35.0 - 47.0 %   MCV 81.8 80.0 - 100.0 fL   MCH 27.6 26.0 - 34.0 pg   MCHC 33.8 32.0 - 36.0 g/dL   RDW 16.114.8 (H) 09.611.5 - 04.514.5 %   Platelets 278 150 - 440 K/uL    Comment: Performed at [facility], [address] [place], [place] [address]     Assessment:   27 y.o. [id number] postoperativeday # 2 s/p repeat Cesarean section   Plan:  1) Chronic anemia worsened with acute blood loss - hemodynamically stable  - po ferrous sulfate and vitamins  2) A POS/ RNI/ VNI-need vaccine postpartum  3) TDAP UTD  4) Bottle/ Depo  5) Disposition-possible discharge later today or in AM  Farrel Connersolleen Dariah Mcsorley, CNM

## 2018-02-24 MED ORDER — OXYCODONE-ACETAMINOPHEN 5-325 MG PO TABS: 1.0000 | ORAL_TABLET | Freq: Four times a day (QID) | ORAL | 0 refills | Status: DC | PRN

## 2018-02-24 NOTE — Discharge Instructions (Signed)
Please call your doctor or return to the ER if you experience any chest pains, shortness of breath, dizziness, visual changes, fever greater than 101, any heavy bleeding (saturating more than 1 pad per hour), large clots, or foul smelling discharge, any worsening abdominal pain and cramping that is not controlled by pain medication, or any signs of postpartum depression. No tampons, enemas, douches, or sexual intercourse for 6 weeks. Also avoid tub baths, hot tubs, or swimming for 6 weeks.    Check your incision daily for any signs of infection such as redness, warmth, swelling, increased pain, or pus/foul smelling drainage   Activity: do not lift over 10 lbs for 6 weeks  No driving for 2 weeks  Pelvic rest for 6 weeks   Continue prenatal vitamin and iron for 6 weeks.  No strenuous activity or heavy lifting for 6 weeks.  Please follow instructions included in incision hygiene kit.  Keep incision clean and dry.

## 2018-02-24 NOTE — Progress Notes (Signed)
Discharge instructions provided.  Pt verbalizes understanding of all instructions and follow-up care.  Prescription given.  Incision hygiene kit given.  Pt discharged to home at 1725 on 02/24/17 via wheelchair by NT. Reed Breech, RN 02/24/2018 8:19 PM

## 2018-03-27 ENCOUNTER — Encounter: Payer: Self-pay | Admitting: Obstetrics and Gynecology

## 2018-03-27 ENCOUNTER — Ambulatory Visit (INDEPENDENT_AMBULATORY_CARE_PROVIDER_SITE_OTHER): Payer: Medicaid Other | Admitting: Obstetrics and Gynecology

## 2018-03-27 DIAGNOSIS — Z789 Other specified health status: Secondary | ICD-10-CM

## 2018-03-27 DIAGNOSIS — Z98891 History of uterine scar from previous surgery: Secondary | ICD-10-CM

## 2018-03-27 DIAGNOSIS — O99013 Anemia complicating pregnancy, third trimester: Secondary | ICD-10-CM

## 2018-03-27 DIAGNOSIS — IMO0001 Reserved for inherently not codable concepts without codable children: Secondary | ICD-10-CM

## 2018-03-27 MED ORDER — MEDROXYPROGESTERONE ACETATE 150 MG/ML IM SUSP
150.0000 mg | INTRAMUSCULAR | 3 refills | Status: DC
Start: 2018-03-27 — End: 2019-04-02

## 2018-03-27 NOTE — Addendum Note (Signed)
Addended by: Adelene IdlerSCHUMAN, Clell Trahan on: 03/27/2018 11:02 AM   Modules accepted: Orders

## 2018-03-27 NOTE — Progress Notes (Signed)
  OBSTETRICS POSTPARTUM CLINIC PROGRESS NOTE  Subjective:     Laura Lucero is a 27 y.o. [id number] female who presents for a postpartum visit. She is 4 weeks postpartum following a Term pregnancy and delivery by C-section.  I have fully reviewed the prenatal and intrapartum course. Anesthesia: spinal.  Postpartum course has been complicated by uncomplicated.  Baby is feeding by Bottle.  Bleeding: patient has not  resumed menses.  Bowel function is normal. Bladder function is normal.  Patient is not sexually active. Contraception method desired is Depo-Provera injections.  Postpartum depression screening: negative. Edinburgh 0.  The following portions of the patient's history were reviewed and updated as appropriate: allergies, current medications, past family history, past medical history, past social history, past surgical history and problem list.  Review of Systems Pertinent items are noted in HPI.  Objective:    LMP 05/18/2017 (Exact Date)   General:  alert and no distress   Breasts:  inspection negative, no nipple discharge or bleeding, no masses or nodularity palpable  Lungs: clear to auscultation bilaterally  Heart:  regular rate and rhythm, S1, S2 normal, no murmur, click, rub or gallop  Abdomen: soft, non-tender; bowel sounds normal; no masses,  no organomegaly.  Well healed Pfannenstiel incision   Vulva:  normal  Vagina: normal vagina, no discharge, exudate, lesion, or erythema  Cervix:  no cervical motion tenderness and no lesions  Corpus: normal size, contour, position, consistency, mobility, non-tender  Adnexa:  normal adnexa and no mass, fullness, tenderness  Rectal Exam: Not performed.          Assessment:  Post Partum Care visit 1. Cesarean delivery delivered    2. History of cesarean delivery    3. Anemia of mother in pregnancy, antepartum, third trimester     Plan:  See orders and Patient Instructions Contraceptive counseling for Depo-Provera Follow  up in: 8 weeks or as needed.    Laura Idlerhristanna Robinson Brinkley MD [facility], [facility] 03/27/18 10:19 AM

## 2018-05-22 ENCOUNTER — Ambulatory Visit: Payer: Medicaid Other

## 2018-05-23 ENCOUNTER — Ambulatory Visit (INDEPENDENT_AMBULATORY_CARE_PROVIDER_SITE_OTHER): Payer: Medicaid Other

## 2018-05-23 DIAGNOSIS — Z3042 Encounter for surveillance of injectable contraceptive: Secondary | ICD-10-CM | POA: Diagnosis not present

## 2018-05-23 MED ORDER — MEDROXYPROGESTERONE ACETATE 150 MG/ML IM SUSP
150.0000 mg | Freq: Once | INTRAMUSCULAR | Status: AC
  Administered 2018-05-23: 150 mg via INTRAMUSCULAR

## 2018-05-30 ENCOUNTER — Ambulatory Visit (INDEPENDENT_AMBULATORY_CARE_PROVIDER_SITE_OTHER): Payer: Medicaid Other | Admitting: Obstetrics and Gynecology

## 2018-05-30 ENCOUNTER — Encounter: Payer: Self-pay | Admitting: Obstetrics and Gynecology

## 2018-05-30 DIAGNOSIS — Z6841 Body Mass Index (BMI) 40.0 and over, adult: Secondary | ICD-10-CM | POA: Diagnosis not present

## 2018-05-30 MED ORDER — PHENTERMINE HCL 37.5 MG PO TABS: 37.5000 mg | ORAL_TABLET | Freq: Every day | ORAL | 0 refills | Status: DC

## 2018-05-30 NOTE — Progress Notes (Signed)
Gynecology Office Visit   Chief Complaint  Patient presents with  . Weight Loss   History of Present Illness: Patientis a 27 y.o. [id number] female, who presents for the evaluation of weight gain. She has gained weight primarily over the past year. The patient states the following issues have contributed to her weight problem: depo provera and poor diet with snackin.  The patient has additional symptoms of knee pain. The patient specifically denies memory loss, muscle weakness, excessive thirst, and polyuria. Weight related co-morbidities include: none. The patient's past medical history is notable for no major medical issues. She has tried no interventions in the past.   Past Medical History:  Diagnosis Date  . Anemia     Past Surgical History:  Procedure Laterality Date  . CESAREAN SECTION  2015  . CESAREAN SECTION N/A 03/07/2016   Procedure: CESAREAN SECTION;  Surgeon: Conard Novak, MD;  Location: [facility] ORS;  Service: Obstetrics;  Laterality: N/A;  . CESAREAN SECTION N/A 02/21/2018   Procedure: CESAREAN SECTION;  Surgeon: Natale Milch, MD;  Location: [facility] ORS;  Service: Obstetrics;  Laterality: N/A;  Time of Birth: [birth year] Sex: Female Weight:   . CYST REMOVAL NECK     Prior to Admission medications   Medication Sig Start Date End Date Taking? Authorizing Provider  medroxyPROGESTERone (DEPO-PROVERA) 150 MG/ML injection Inject 1 mL (150 mg total) into the muscle every 3 (three) months. 03/27/18 06/25/18 Yes Schuman, Jaquelyn Bitter, MD    Allergies  Allergen Reactions  . Ampicillin Anaphylaxis    Has patient had a PCN reaction causing immediate rash, facial/tongue/throat swelling, SOB or lightheadedness with hypotension: Yes Has patient had a PCN reaction causing severe rash involving mucus membranes or skin necrosis: No Has patient had a PCN reaction that required hospitalization: No Has patient had a PCN reaction occurring within the last 10 years: Yes If all of the  above answers are "NO", then may proceed with Cephalosporin use. Throat swelling    Gynecologic History: No LMP recorded. Patient has had an injection.  Obstetric History: [id number]  Family History  Problem Relation Age of Onset  . Healthy Mother   . Healthy Father    Social History   Socioeconomic History  . Marital status: Single    Spouse name: Not on file  . Number of children: Not on file  . Years of education: Not on file  . Highest education level: Not on file  Occupational History  . Not on file  Social Needs  . Financial resource strain: Not on file  . Food insecurity:    Worry: Not on file    Inability: Not on file  . Transportation needs:    Medical: Not on file    Non-medical: Not on file  Tobacco Use  . Smoking status: Never Smoker  . Smokeless tobacco: Never Used  Substance and Sexual Activity  . Alcohol use: No  . Drug use: No  . Sexual activity: Not Currently    Partners: Male    Birth control/protection: Injection    Comment: DEPO  Lifestyle  . Physical activity:    Days per week: Not on file    Minutes per session: Not on file  . Stress: Not on file  Relationships  . Social connections:    Talks on phone: Not on file    Gets together: Not on file    Attends religious service: Not on file    Active member of club or organization: Not  on file    Attends meetings of clubs or organizations: Not on file    Relationship status: Not on file  . Intimate partner violence:    Fear of current or ex partner: Not on file    Emotionally abused: Not on file    Physically abused: Not on file    Forced sexual activity: Not on file  Other Topics Concern  . Not on file  Social History Narrative  . Not on file   Review of Systems  Constitutional: Negative.   HENT: Negative.   Eyes: Negative.   Respiratory: Negative.   Cardiovascular: Negative.   Gastrointestinal: Negative.   Genitourinary: Negative.   Musculoskeletal: Negative.   Skin: Negative.     Neurological: Negative.   Psychiatric/Behavioral: Negative.     Physical Exam BP 117/74 (BP Location: Left Arm, Patient Position: Sitting, Cuff Size: Large)   Pulse 65   Ht 5\' 3"  (1.6 m)   Wt 237 lb (107.5 kg)   BMI 41.98 kg/m   No LMP recorded. Patient has had an injection.  General: NAD HEENT: normocephalic, anicteric Thyroid: no enlargement Pulmonary: no increased work of breathing Neurologic: Grossly intact Psychiatric: mood appropriate, affect full  Assessment: 27 y.o. [id number] here for 1. Morbid obesity (HCC)   2. BMI 40.0-44.9, adult (HCC)     Plan: Problem List Items Addressed This Visit      Other   Morbid obesity (HCC) - Primary   Relevant Medications   phentermine (ADIPEX-P) 37.5 MG tablet   BMI 40.0-44.9, adult (HCC)   Relevant Medications   phentermine (ADIPEX-P) 37.5 MG tablet     1) 1800 Calorie ADA Diet  2) Patient education given regarding appropriate lifestyle changes for weight loss including: regular physical activity, healthy coping strategies, caloric restriction and healthy eating patterns.  3) Patient will be started on weight loss medication. The risks and benefits and side effects of medication, such as Adipex (Phenteramine) ,  Tenuate (Diethylproprion), Belviq (lorcarsin), Contrave (buproprion/naltrexone), Qsymia (phentermine/topiramate), and Saxenda (liraglutide) is discussed. The pros and cons of suppressing appetite and boosting metabolism is discussed. Risks of tolerence and addiction is discussed for selected agents discussed. Use of medicine will ne short term, such as 3-4 months at a time followed by a period of time off of the medicine to avoid these risks and side effects for Adipex, Qsymia, and Tenuate discussed. Pt to call with any negative side effects and agrees to keep follow up appts.  4) Comorbidity Screening - hypothyroidism screening, diabetes, and hyperlipidemia screening offered  5) Encouraged weekly weight monitorig to track  progress and sample 1 week food diary  6) Contraception - discussed that all weight loss drugs fall in to pregnancy category X, patient currently has reliable contraception in the form of Depo Provera.  We discussed that changing this form of contraception might be very beneficial in maintaining and losing weight.   7) 15 minutes face-to-face; counseling/coordination of care > 50 percent of visit  8) Follow up in 4 weeks to assess response  Thomasene MohairStephen Raegan Sipp, MD 05/30/2018 5:05 PM

## 2018-06-27 ENCOUNTER — Ambulatory Visit: Payer: Medicaid Other | Admitting: Obstetrics and Gynecology

## 2018-08-03 ENCOUNTER — Ambulatory Visit (INDEPENDENT_AMBULATORY_CARE_PROVIDER_SITE_OTHER): Payer: Medicaid Other | Admitting: Obstetrics and Gynecology

## 2018-08-03 ENCOUNTER — Ambulatory Visit: Payer: Medicaid Other | Admitting: Obstetrics and Gynecology

## 2018-08-03 ENCOUNTER — Encounter: Payer: Self-pay | Admitting: Obstetrics and Gynecology

## 2018-08-03 VITALS — BP 112/70 | HR 76 | Ht 63.0 in | Wt 234.0 lb

## 2018-08-03 DIAGNOSIS — Z789 Other specified health status: Secondary | ICD-10-CM | POA: Diagnosis not present

## 2018-08-03 DIAGNOSIS — Z3009 Encounter for other general counseling and advice on contraception: Secondary | ICD-10-CM | POA: Diagnosis not present

## 2018-08-03 NOTE — Progress Notes (Signed)
Patient ID: Laura Lucero, female   DOB: 05/30/1991, 28 y.o.   MRN: [id number]  Reason for Consult: Contraception (Doesnt want to have a period)   Referred by Department, [place]  Subjective:     HPI:  Laura Lucero is a 28 y.o. female she has been using Depo-Provera for birth control since her delivery.  She is not happy with this medication because she has not noticed weight gain.  She would like to switch contraception methods but is torn because she is happy not having her period.  She would like to review her contraception options.   Past Medical History:  Diagnosis Date  . Anemia    Family History  Problem Relation Age of Onset  . Healthy Mother   . Healthy Father    Past Surgical History:  Procedure Laterality Date  . CESAREAN SECTION  2015  . CESAREAN SECTION N/A 03/07/2016   Procedure: CESAREAN SECTION;  Surgeon: Conard Novak, MD;  Location: [facility] ORS;  Service: Obstetrics;  Laterality: N/A;  . CESAREAN SECTION N/A 02/21/2018   Procedure: CESAREAN SECTION;  Surgeon: Natale Milch, MD;  Location: [facility] ORS;  Service: Obstetrics;  Laterality: N/A;  Time of Birth: 09[birth year] Sex: Female Weight:   . CYST REMOVAL NECK      Short Social History:  Social History   Tobacco Use  . Smoking status: Never Smoker  . Smokeless tobacco: Never Used  Substance Use Topics  . Alcohol use: No    Allergies  Allergen Reactions  . Ampicillin Anaphylaxis    Has patient had a PCN reaction causing immediate rash, facial/tongue/throat swelling, SOB or lightheadedness with hypotension: Yes Has patient had a PCN reaction causing severe rash involving mucus membranes or skin necrosis: No Has patient had a PCN reaction that required hospitalization: No Has patient had a PCN reaction occurring within the last 10 years: Yes If all of the above answers are "NO", then may proceed with Cephalosporin use. Throat swelling    Current Outpatient Medications  Medication Sig  Dispense Refill  . phentermine (ADIPEX-P) 37.5 MG tablet Take 1 tablet (37.5 mg total) by mouth daily before breakfast. 30 tablet 0  . medroxyPROGESTERone (DEPO-PROVERA) 150 MG/ML injection Inject 1 mL (150 mg total) into the muscle every 3 (three) months. 1 mL 3   No current facility-administered medications for this visit.     Review of Systems  Constitutional: Negative for chills, fatigue, fever and unexpected weight change.  HENT: Negative for trouble swallowing.  Eyes: Negative for loss of vision.  Respiratory: Negative for cough, shortness of breath and wheezing.  Cardiovascular: Negative for chest pain, leg swelling, palpitations and syncope.  GI: Negative for abdominal pain, blood in stool, diarrhea, nausea and vomiting.  GU: Negative for difficulty urinating, dysuria, frequency and hematuria.  Musculoskeletal: Negative for back pain, leg pain and joint pain.  Skin: Negative for rash.  Neurological: Negative for dizziness, headaches, light-headedness, numbness and seizures.  Psychiatric: Negative for behavioral problem, confusion, depressed mood and sleep disturbance.        Objective:  Objective   Vitals:   08/03/18 1359  BP: 112/70  Pulse: 76  Weight: 234 lb (106.1 kg)  Height: 5\' 3"  (1.6 m)   Body mass index is 41.45 kg/m.  Physical Exam Vitals signs and nursing note reviewed.  Constitutional:      Appearance: She is well-developed.  HENT:     Head: Normocephalic and atraumatic.  Eyes:     Pupils: Pupils are  equal, round, and reactive to light.  Cardiovascular:     Rate and Rhythm: Normal rate and regular rhythm.  Pulmonary:     Effort: Pulmonary effort is normal. No respiratory distress.  Skin:    General: Skin is warm and dry.  Neurological:     Mental Status: She is alert and oriented to person, place, and time.  Psychiatric:        Behavior: Behavior normal.        Thought Content: Thought content normal.        Judgment: Judgment normal.           Assessment/Plan:     28 yo using Depo Provera for birth control.  Reviewed options with the patient for new methods of contraception.  Reviewed options that would give her less frequent menses.  Discussed the NuvaRing IUD Nexplanon and extended cycle oral contraception devices as well as the contraceptive patch.  She was given information about [email or web address] as well as provided with multiple pamphlets on various options for birth control.  More than 15 minutes were spent face to face with the patient in the room with more than 50% of the time spent providing counseling and discussing the plan of management.   She will follow-up in 1 to 2 weeks to initiate a new method.      Adelene Idler MD [facility], [facility] 08/03/2018 2:37 PM

## 2018-08-10 ENCOUNTER — Ambulatory Visit: Payer: Medicaid Other | Admitting: Obstetrics and Gynecology

## 2018-08-15 ENCOUNTER — Ambulatory Visit: Payer: Medicaid Other

## 2018-09-26 ENCOUNTER — Encounter: Payer: Self-pay | Admitting: Emergency Medicine

## 2018-09-26 ENCOUNTER — Ambulatory Visit
Admission: EM | Admit: 2018-09-26 | Discharge: 2018-09-26 | Disposition: A | Discharge status: Home / Self Care | Payer: Medicaid Other | Attending: Family Medicine | Admitting: Family Medicine

## 2018-09-26 ENCOUNTER — Other Ambulatory Visit: Payer: Self-pay

## 2018-09-26 DIAGNOSIS — J020 Streptococcal pharyngitis: Secondary | ICD-10-CM | POA: Insufficient documentation

## 2018-09-26 LAB — RAPID INFLUENZA A&B ANTIGENS (ARMC ONLY): INFLUENZA A (ARMC): NEGATIVE

## 2018-09-26 LAB — RAPID STREP SCREEN (MED CTR MEBANE ONLY): STREPTOCOCCUS, GROUP A SCREEN (DIRECT): POSITIVE — AB

## 2018-09-26 LAB — RAPID INFLUENZA A&B ANTIGENS: Influenza B (ARMC): NEGATIVE

## 2018-09-26 MED ORDER — AZITHROMYCIN 250 MG PO TABS: ORAL_TABLET | ORAL | 0 refills | Status: DC

## 2018-09-26 NOTE — Discharge Instructions (Addendum)
Take medication as prescribed. Rest. Drink plenty of fluids. Ibuprofen tylenol as needed.   Follow up with your primary care physician this week as needed. Return to Urgent care for new or worsening concerns.

## 2018-09-26 NOTE — ED Provider Notes (Signed)
[facility] ____________________________________________  Time seen: Approximately 9:13 AM  I have reviewed the triage vital signs and the nursing notes.   HISTORY  Chief Complaint Sore Throat   HPI Laura Lucero is a 28 y.o. female presenting for evaluation of sore throat, chills, body aches since yesterday.  States minimal nasal congestion.  Denies cough.  Subjective fevers.  Have been taken over-the-counter Tylenol and ibuprofen, last took ibuprofen earlier this morning.  States sore throat is currently mild to moderate.  Painful to swallow but continues to be able to eat and drink overall well.  States her son tested positive for strep throat yesterday.  Denies chest pain, shortness of breath, abdominal pain or other complaints.  Denies current pregnancy.   Past Medical History:  Diagnosis Date  . Anemia     Patient Active Problem List   Diagnosis Date Noted  . Morbid obesity (HCC) 05/30/2018  . BMI 40.0-44.9, adult (HCC) 05/30/2018  . History of cesarean delivery 03/07/2016    Past Surgical History:  Procedure Laterality Date  . CESAREAN SECTION  2015  . CESAREAN SECTION N/A 03/07/2016   Procedure: CESAREAN SECTION;  Surgeon: Conard Novak, MD;  Location: [facility] ORS;  Service: Obstetrics;  Laterality: N/A;  . CESAREAN SECTION N/A 02/21/2018   Procedure: CESAREAN SECTION;  Surgeon: Natale Milch, MD;  Location: [facility] ORS;  Service: Obstetrics;  Laterality: N/A;  Time of Birth: 09[birth year] Sex: Female Weight:   . CYST REMOVAL NECK       No current facility-administered medications for this encounter.   Current Outpatient Medications:  .  medroxyPROGESTERone (DEPO-PROVERA) 150 MG/ML injection, Inject 1 mL (150 mg total) into the muscle every 3 (three) months., Disp: 1 mL, Rfl: 3 .  phentermine (ADIPEX-P) 37.5 MG tablet, Take 1 tablet (37.5 mg total) by mouth daily before breakfast., Disp: 30 tablet, Rfl: 0 .  azithromycin (ZITHROMAX Z-PAK) 250 MG  tablet, Take 2 tablets (500 mg) on  Day 1,  followed by 1 tablet (250 mg) once daily on Days 2 through 5., Disp: 6 each, Rfl: 0  Allergies Ampicillin  Family History  Problem Relation Age of Onset  . Healthy Mother   . Healthy Father     Social History Social History   Tobacco Use  . Smoking status: Never Smoker  . Smokeless tobacco: Never Used  Substance Use Topics  . Alcohol use: No  . Drug use: No    Review of Systems Constitutional: positive subjective fevers.  ENT: Positive sore throat. Cardiovascular: Denies chest pain. Respiratory: Denies shortness of breath. Gastrointestinal: No abdominal pain.  Skin: Negative for rash.   ____________________________________________   PHYSICAL EXAM:  VITAL SIGNS: ED Triage Vitals  Enc Vitals Group     BP 09/26/18 0837 100/68     Pulse Rate 09/26/18 0837 100     Resp 09/26/18 0837 18     Temp 09/26/18 0837 99.1 F (37.3 C)     Temp Source 09/26/18 0837 Oral     SpO2 09/26/18 0837 100 %     Weight 09/26/18 0834 215 lb (97.5 kg)     Height 09/26/18 0834 5\' 3"  (1.6 m)     Head Circumference --      Peak Flow --      Pain Score 09/26/18 0834 5     Pain Loc --      Pain Edu? --      Excl. in GC? --     Constitutional: Alert and oriented.  Well appearing and in no acute distress. Eyes: Conjunctivae are normal.  Head: Atraumatic. No sinus tenderness to palpation. No swelling. No erythema.  Ears: no erythema, normal TMs bilaterally.   Nose:Nasal congestion   Mouth/Throat: Mucous membranes are moist. Moderate pharyngeal erythema.  Mild bilateral tonsillar swelling.  No exudate. Neck: No stridor.  No cervical spine tenderness to palpation. Hematological/Lymphatic/Immunilogical: Mild anterior bilateral cervical lymphadenopathy. Cardiovascular: Normal rate, regular rhythm. Grossly normal heart sounds.  Good peripheral circulation. Respiratory: Normal respiratory effort.  No retractions. No wheezes, rales or rhonchi. Good air  movement.  Musculoskeletal: Ambulatory with steady gait.  Neurologic:  Normal speech and language. No gait instability. Skin:  Skin appears warm, dry and intact. No rash noted. Psychiatric: Mood and affect are normal. Speech and behavior are normal. ___________________________________________   LABS (all labs ordered are listed, but only abnormal results are displayed)  Labs Reviewed  RAPID STREP SCREEN ([facility] ONLY) - Abnormal; Notable for the following components:      Result Value   Streptococcus, Group A Screen (Direct) POSITIVE (*)    All other components within normal limits  RAPID INFLUENZA A&B ANTIGENS ([facility] ONLY)   ____________________________________________  PROCEDURES Procedures   INITIAL IMPRESSION / ASSESSMENT AND PLAN / ED COURSE  Pertinent labs & imaging results that were available during my care of the patient were reviewed by me and considered in my medical decision making (see chart for details).  Well-appearing patient.  No acute distress.  Strep positive.  Patient reports allergic to ampicillin and unsure if she is ever tolerated cephalosporins.  Will treat with oral azithromycin.  Encourage continue over-the-counter Tylenol, ibuprofen, rest, fluids and gargles.Discussed indication, risks and benefits of medications with patient.  Discussed follow up with Primary care physician this week. Discussed follow up and return parameters including no resolution or any worsening concerns. Patient verbalized understanding and agreed to plan.   ____________________________________________   FINAL CLINICAL IMPRESSION(S) / ED DIAGNOSES  Final diagnoses:  Strep pharyngitis     ED Discharge Orders         Ordered    azithromycin (ZITHROMAX Z-PAK) 250 MG tablet     09/26/18 0912           Note: This dictation was prepared with Dragon dictation along with smaller phrase technology. Any transcriptional errors that result from this process are  unintentional.         Renford Dills, NP 09/26/18 1046

## 2018-09-26 NOTE — ED Triage Notes (Signed)
Pt c/o sore throat, sweats, subjective fever, mild body aches, chills, nasal congestion. Started yesterday. She took Ibuprofen around 5 am this morning.

## 2018-10-22 ENCOUNTER — Telehealth: Payer: Self-pay

## 2018-10-22 NOTE — Telephone Encounter (Signed)
Pt called to set up depo appt.  States her mom gave her Feb dose b/c she missed her appt.  [id number]  Pt states mom gave depo either Feb 1st or 2nd.  Pt tx to [facility] to sched between 4/18 and May 2.

## 2018-10-30 ENCOUNTER — Ambulatory Visit: Payer: Medicaid Other

## 2019-04-01 ENCOUNTER — Other Ambulatory Visit: Payer: Self-pay | Admitting: Obstetrics and Gynecology

## 2019-07-28 DIAGNOSIS — R87810 Cervical high risk human papillomavirus (HPV) DNA test positive: Secondary | ICD-10-CM | POA: Insufficient documentation

## 2019-07-28 NOTE — Progress Notes (Deleted)
PCP:  Department, [facility]   No chief complaint on file.    HPI:      [name] is a 29 y.o. [id number] who LMP was No LMP recorded. Patient has had an injection., presents today for her annual examination.  Her menses are {norm/abn:715}, lasting {number:22536} days.  Dysmenorrhea {dysmen:716}. She {does:18564} have intermenstrual bleeding.  Sex activity: {sex active:315163}.  Last Pap: November 01, 2017  Results were: no abnormalities /POS HPV DNA ; repepat pap due today Hx of STDs: {STD hx:14358}  Last mammogram: {date:304500300}  Results were: {norm/abn:13465} There is no FH of breast cancer. There is no FH of ovarian cancer. The patient {does:18564} do self-breast exams.  Tobacco use: {tob:20664} Alcohol use: {Alcohol:11675} No drug use.  Exercise: {exercise:31265}  She {does:18564} get adequate calcium and Vitamin D in her diet.   Patient Active Problem List   Diagnosis Date Noted  . Morbid obesity ([place]) 05/30/2018  . BMI 40.0-44.9, adult ([place]) 05/30/2018  . History of cesarean delivery 03/07/2016    Past Surgical History:  Procedure Laterality Date  . CESAREAN SECTION  2015  . CESAREAN SECTION N/A 03/07/2016   Procedure: CESAREAN SECTION;  Surgeon: Will Bonnet, MD;  Location: [facility] ORS;  Service: Obstetrics;  Laterality: N/A;  . CESAREAN SECTION N/A 02/21/2018   Procedure: CESAREAN SECTION;  Surgeon: Homero Fellers, MD;  Location: [facility] ORS;  Service: Obstetrics;  Laterality: N/A;  Time of Birth: [birth year] Sex: Female Weight:   . CYST REMOVAL NECK      Family History  Problem Relation Age of Onset  . Healthy Mother   . Healthy Father     Social History   Socioeconomic History  . Marital status: Single    Spouse name: Not on file  . Number of children: Not on file  . Years of education: Not on file  . Highest education level: Not on file  Occupational History  . Not on file  Tobacco Use  . Smoking status: Never Smoker  .  Smokeless tobacco: Never Used  Substance and Sexual Activity  . Alcohol use: No  . Drug use: No  . Sexual activity: Yes    Partners: Male    Birth control/protection: Injection    Comment: DEPO  Other Topics Concern  . Not on file  Social History Narrative  . Not on file   Social Determinants of Health   Financial Resource Strain:   . Difficulty of Paying Living Expenses: Not on file  Food Insecurity:   . Worried About Charity fundraiser in the Last Year: Not on file  . Ran Out of Food in the Last Year: Not on file  Transportation Needs:   . Lack of Transportation (Medical): Not on file  . Lack of Transportation (Non-Medical): Not on file  Physical Activity:   . Days of Exercise per Week: Not on file  . Minutes of Exercise per Session: Not on file  Stress:   . Feeling of Stress : Not on file  Social Connections:   . Frequency of Communication with Friends and Family: Not on file  . Frequency of Social Gatherings with Friends and Family: Not on file  . Attends Religious Services: Not on file  . Active Member of Clubs or Organizations: Not on file  . Attends Archivist Meetings: Not on file  . Marital Status: Not on file  Intimate Partner Violence:   . Fear of Current or Ex-Partner: Not on file  .  Emotionally Abused: Not on file  . Physically Abused: Not on file  . Sexually Abused: Not on file     Current Outpatient Medications:  .  azithromycin (ZITHROMAX Z-PAK) 250 MG tablet, Take 2 tablets (500 mg) on  Day 1,  followed by 1 tablet (250 mg) once daily on Days 2 through 5., Disp: 6 each, Rfl: 0 .  medroxyPROGESTERone Acetate 150 MG/ML SUSY, INJECT ONE ML (150MG  TOTAL) INTO THE MUSCLE EVERY 3 MONTHS., Disp: 1 mL, Rfl: 0 .  phentermine (ADIPEX-P) 37.5 MG tablet, Take 1 tablet (37.5 mg total) by mouth daily before breakfast., Disp: 30 tablet, Rfl: 0     ROS:  Review of Systems BREAST: No symptoms   Objective: There were no vitals taken for this  visit.   OBGyn Exam  Results: No results found for this or any previous visit (from the past 24 hour(s)).  Assessment/Plan: No diagnosis found.  No orders of the defined types were placed in this encounter.            GYN counsel {counseling:16159}     F/U  No follow-ups on file.  Laura Lucero B. Laura Cuadrado, PA-C 07/28/2019 11:51 AM

## 2019-07-30 ENCOUNTER — Ambulatory Visit: Payer: Medicaid Other | Admitting: Obstetrics and Gynecology

## 2019-10-29 ENCOUNTER — Telehealth: Payer: Self-pay | Admitting: Family Medicine

## 2019-10-29 NOTE — Telephone Encounter (Signed)
Pt. would like some infomation about Doxycline Rx

## 2019-10-30 NOTE — Telephone Encounter (Signed)
Has appt 10/31/19 Richmond Campbell, RN

## 2019-10-31 ENCOUNTER — Ambulatory Visit: Payer: Medicaid Other | Admitting: Physician Assistant

## 2019-10-31 ENCOUNTER — Other Ambulatory Visit: Payer: Self-pay

## 2019-10-31 DIAGNOSIS — Z113 Encounter for screening for infections with a predominantly sexual mode of transmission: Secondary | ICD-10-CM

## 2019-10-31 LAB — WET PREP FOR TRICH, YEAST, CLUE
Trichomonas Exam: NEGATIVE
Yeast Exam: NEGATIVE

## 2019-11-03 ENCOUNTER — Encounter: Payer: Self-pay | Admitting: Physician Assistant

## 2019-11-03 NOTE — Progress Notes (Signed)
[facility] Department STI clinic/screening visit  Subjective:  Laura Lucero is a 29 y.o. female being seen today for an STI screening visit. The patient reports they do not have symptoms.  Patient reports that they do not desire a pregnancy in the next year.   They reported they are not interested in discussing contraception today.  No LMP recorded. Patient has had an injection.   Patient has the following medical conditions:   Patient Active Problem List   Diagnosis Date Noted  . Cervical high risk human papillomavirus (HPV) DNA test positive 07/28/2019  . Morbid obesity (HCC) 05/30/2018  . BMI 40.0-44.9, adult (HCC) 05/30/2018  . History of cesarean delivery 03/07/2016    Chief Complaint  Patient presents with  . SEXUALLY TRANSMITTED DISEASE    HPI  Patient reports that she is not having any symptoms but would like a screening.  Reports that she found an empty bottle with her partner's name on it and Doxycycline was on the label.  States that her partner told her that it was for acne but she would like to have a screening to be sure.  States that she is using Depo as BCM and does not have periods with the Depo.   See flowsheet for further details and programmatic requirements.    The following portions of the patient's history were reviewed and updated as appropriate: allergies, current medications, past medical history, past social history, past surgical history and problem list.  Objective:  There were no vitals filed for this visit.  Physical Exam Constitutional:      General: She is not in acute distress.    Appearance: Normal appearance.  HENT:     Head: Normocephalic and atraumatic.     Comments: No nits, lice or hair loss. No cervical, supraclavicular or axillary adenopathy.    Mouth/Throat:     Mouth: Mucous membranes are moist.     Pharynx: Oropharynx is clear. No oropharyngeal exudate or posterior oropharyngeal erythema.  Eyes:   Conjunctiva/sclera: Conjunctivae normal.  Pulmonary:     Effort: Pulmonary effort is normal.  Abdominal:     Palpations: Abdomen is soft. There is no mass.     Tenderness: There is no abdominal tenderness. There is no guarding or rebound.  Genitourinary:    General: Normal vulva.     Rectum: Normal.     Comments: External genitalia/pubic area without nits, lice, edema, erythema, lesions and inguinal adenopathy. Vagina with normal mucosa and discharge. Cervix without visible lesions. Uterus firm, mobile, nt, no masses, no CMT, no adnexal tenderness or fullness. Musculoskeletal:     Cervical back: Neck supple. No tenderness.  Skin:    General: Skin is warm and dry.     Findings: No bruising, erythema, lesion or rash.  Neurological:     Mental Status: She is alert and oriented to person, place, and time.  Psychiatric:        Mood and Affect: Mood normal.        Behavior: Behavior normal.        Thought Content: Thought content normal.        Judgment: Judgment normal.      Assessment and Plan:  Laura Lucero is a 29 y.o. female presenting to the [facility] Department for STI screening  1. Screening for STD (sexually transmitted disease) Patient into clinic without symptoms. Rec condoms with all sex. Await test results.  Counseled that RN will call if needs to RTC for treatment once  results are back. Counseled patient to call back for treatment appointment if she finds out that partner was treated for an STD she would need to be treated for as a contact before her results are back . - WET PREP FOR [place], YEAST, [place] [facility] - Syphilis Serology, [facility]     No follow-ups on file.  No future appointments.  Jerene Dilling, PA

## 2019-12-27 ENCOUNTER — Other Ambulatory Visit: Payer: Medicaid Other

## 2020-04-06 ENCOUNTER — Ambulatory Visit (INDEPENDENT_AMBULATORY_CARE_PROVIDER_SITE_OTHER): Payer: Medicaid Other

## 2020-04-06 ENCOUNTER — Other Ambulatory Visit: Payer: Self-pay

## 2020-04-06 ENCOUNTER — Encounter: Payer: Self-pay | Admitting: Emergency Medicine

## 2020-04-06 ENCOUNTER — Ambulatory Visit
Admission: EM | Admit: 2020-04-06 | Discharge: 2020-04-06 | Disposition: A | Discharge status: Home / Self Care | Payer: Medicaid Other | Attending: Internal Medicine | Admitting: Internal Medicine

## 2020-04-06 DIAGNOSIS — Z20822 Contact with and (suspected) exposure to covid-19: Secondary | ICD-10-CM | POA: Diagnosis not present

## 2020-04-06 DIAGNOSIS — R509 Fever, unspecified: Secondary | ICD-10-CM | POA: Insufficient documentation

## 2020-04-06 DIAGNOSIS — R110 Nausea: Secondary | ICD-10-CM | POA: Insufficient documentation

## 2020-04-06 DIAGNOSIS — R52 Pain, unspecified: Secondary | ICD-10-CM | POA: Diagnosis not present

## 2020-04-06 DIAGNOSIS — J209 Acute bronchitis, unspecified: Secondary | ICD-10-CM | POA: Diagnosis not present

## 2020-04-06 DIAGNOSIS — R0789 Other chest pain: Secondary | ICD-10-CM | POA: Insufficient documentation

## 2020-04-06 DIAGNOSIS — R519 Headache, unspecified: Secondary | ICD-10-CM | POA: Insufficient documentation

## 2020-04-06 DIAGNOSIS — Z79899 Other long term (current) drug therapy: Secondary | ICD-10-CM | POA: Insufficient documentation

## 2020-04-06 DIAGNOSIS — Z6838 Body mass index (BMI) 38.0-38.9, adult: Secondary | ICD-10-CM | POA: Diagnosis not present

## 2020-04-06 DIAGNOSIS — R079 Chest pain, unspecified: Secondary | ICD-10-CM

## 2020-04-06 DIAGNOSIS — N3000 Acute cystitis without hematuria: Secondary | ICD-10-CM | POA: Diagnosis not present

## 2020-04-06 LAB — URINALYSIS, COMPLETE (UACMP) WITH MICROSCOPIC
Bilirubin Urine: NEGATIVE
Glucose, UA: NEGATIVE mg/dL
Nitrite: NEGATIVE
Protein, ur: 30 mg/dL — AB
Specific Gravity, Urine: 1.025 (ref 1.005–1.030)
pH: 5.5 (ref 5.0–8.0)

## 2020-04-06 LAB — CBC WITH DIFFERENTIAL/PLATELET
Abs Immature Granulocytes: 0.05 10*3/uL (ref 0.00–0.07)
Basophils Absolute: 0 10*3/uL (ref 0.0–0.1)
Basophils Relative: 0 %
Eosinophils Absolute: 0 10*3/uL (ref 0.0–0.5)
Eosinophils Relative: 0 %
HCT: 37.3 % (ref 36.0–46.0)
Hemoglobin: 13 g/dL (ref 12.0–15.0)
Immature Granulocytes: 0 %
Lymphocytes Relative: 9 %
Lymphs Abs: 1.1 10*3/uL (ref 0.7–4.0)
MCH: 30.9 pg (ref 26.0–34.0)
MCHC: 34.9 g/dL (ref 30.0–36.0)
MCV: 88.6 fL (ref 80.0–100.0)
Monocytes Absolute: 0.7 10*3/uL (ref 0.1–1.0)
Monocytes Relative: 5 %
Neutro Abs: 10.8 10*3/uL — ABNORMAL HIGH (ref 1.7–7.7)
Neutrophils Relative %: 86 %
Platelets: 247 10*3/uL (ref 150–400)
RBC: 4.21 MIL/uL (ref 3.87–5.11)
RDW: 12 % (ref 11.5–15.5)
WBC: 12.6 10*3/uL — ABNORMAL HIGH (ref 4.0–10.5)
nRBC: 0 % (ref 0.0–0.2)

## 2020-04-06 LAB — SARS CORONAVIRUS 2 AG (30 MIN TAT): SARS Coronavirus 2 Ag: NEGATIVE

## 2020-04-06 MED ORDER — IBUPROFEN 800 MG PO TABS
800.0000 mg | ORAL_TABLET | Freq: Once | ORAL | Status: AC
  Administered 2020-04-06: 800 mg via ORAL

## 2020-04-06 MED ORDER — LEVOFLOXACIN 500 MG PO TABS: 500.0000 mg | ORAL_TABLET | Freq: Every day | ORAL | 0 refills | Status: DC

## 2020-04-06 NOTE — Discharge Instructions (Signed)
Push fluids and continue taking tylenol or Ibuprofen as needed for fever and body aches. Your covid test is negative. Your white count shows you have a bacteria infection.

## 2020-04-06 NOTE — ED Triage Notes (Signed)
Patient in today c/o body aches, headache and chills since last night. Patient has taken OTC Tylenol at ~1pm. Patient has been fully covid vaccinated.

## 2020-04-06 NOTE — ED Provider Notes (Signed)
[facility]    CSN: [id number] Arrival date & time: 04/06/20  1634      History   Chief Complaint Chief Complaint  Patient presents with  . Generalized Body Aches  . Headache  . Chills    HPI Laura Lucero is a 29 y.o. female who presents with HA and body aches since last night. She woke up in the middle of the night with chest pressure and had been chilling a lot, did not know she had a fever. When got in the tub in hot water. She does not have a thermometer to check her temp. She increase her heat to 80 degrees at home. Denies dysuria.  Has mild nausea, but no d/v     Past Medical History:  Diagnosis Date  . Anemia     Patient Active Problem List   Diagnosis Date Noted  . Cervical high risk human papillomavirus (HPV) DNA test positive 07/28/2019  . Morbid obesity (HCC) 05/30/2018  . BMI 40.0-44.9, adult (HCC) 05/30/2018  . History of cesarean delivery 03/07/2016    Past Surgical History:  Procedure Laterality Date  . CESAREAN SECTION  2015  . CESAREAN SECTION N/A 03/07/2016   Procedure: CESAREAN SECTION;  Surgeon: Conard Novak, MD;  Location: [facility] ORS;  Service: Obstetrics;  Laterality: N/A;  . CESAREAN SECTION N/A 02/21/2018   Procedure: CESAREAN SECTION;  Surgeon: Natale Milch, MD;  Location: [facility] ORS;  Service: Obstetrics;  Laterality: N/A;  Time of Birth: 09:[birth year] Sex: Female Weight:   . CYST REMOVAL NECK      OB History    Gravida  5   Para  4   Term  4   Preterm      AB  1   Living  4     SAB  1   TAB      Ectopic      Multiple  0   Live Births  4            Home Medications    Prior to Admission medications   Medication Sig Start Date End Date Taking? Authorizing Provider  levofloxacin (LEVAQUIN) 500 MG tablet Take 1 tablet (500 mg total) by mouth daily. 04/06/20   Rodriguez-Southworth, Nettie Elm, PA-C  medroxyPROGESTERone Acetate 150 MG/ML SUSY INJECT ONE ML (150MG  TOTAL) INTO THE MUSCLE EVERY 3 MONTHS.  04/02/19 04/06/20  04/08/20, MD  phentermine (ADIPEX-P) 37.5 MG tablet Take 1 tablet (37.5 mg total) by mouth daily before breakfast. 05/30/18 04/06/20  04/08/20, MD    Family History Family History  Problem Relation Age of Onset  . Healthy Mother   . Healthy Father     Social History Social History   Tobacco Use  . Smoking status: Never Smoker  . Smokeless tobacco: Never Used  Vaping Use  . Vaping Use: Never used  Substance Use Topics  . Alcohol use: No  . Drug use: No     Allergies   Ampicillin   Review of Systems Review of Systems  Constitutional: Positive for chills, fatigue and fever. Negative for appetite change and diaphoresis.  HENT: Negative.   Eyes: Negative for discharge.  Respiratory: Positive for chest tightness. Negative for cough, shortness of breath and wheezing.   Cardiovascular: Negative for chest pain.  Gastrointestinal: Positive for nausea. Negative for abdominal pain, diarrhea and vomiting.  Genitourinary: Negative for dysuria, urgency and vaginal discharge.  Musculoskeletal: Positive for myalgias. Negative for gait problem, neck pain and neck stiffness.  Skin: Negative for rash.  Neurological: Positive for headaches. Negative for dizziness and weakness.  Hematological: Negative for adenopathy.     Physical Exam Triage Vital Signs ED Triage Vitals  Enc Vitals Group     BP 04/06/20 1753 110/73     Pulse Rate 04/06/20 1753 92     Resp 04/06/20 1753 18     Temp 04/06/20 1753 (!) 103.1 F (39.5 C)     Temp Source 04/06/20 1753 Oral     SpO2 04/06/20 1753 100 %     Weight 04/06/20 1752 220 lb (99.8 kg)     Height 04/06/20 1752 5\' 3"  (1.6 m)     Head Circumference --      Peak Flow --      Pain Score 04/06/20 1752 9     Pain Loc --      Pain Edu? --      Excl. in GC? --    No data found.  Updated Vital Signs BP 110/73 (BP Location: Left Arm)   Pulse 92   Temp (!) 100.5 F (38.1 C) (Oral)   Resp 18   Ht 5\' 3"   (1.6 m)   Wt 220 lb (99.8 kg)   LMP 03/11/2020 (Approximate)   SpO2 100%   BMI 38.97 kg/m   Visual Acuity Right Eye Distance:   Left Eye Distance:   Bilateral Distance:    Right Eye Near:   Left Eye Near:    Bilateral Near:     Physical Exam   UC Treatments / Results  Labs (all labs ordered are listed, but only abnormal results are displayed) Labs Reviewed  CBC WITH DIFFERENTIAL/PLATELET - Abnormal; Notable for the following components:      Result Value   WBC 12.6 (*)    Neutro Abs 10.8 (*)    All other components within normal limits  URINALYSIS, COMPLETE (UACMP) WITH MICROSCOPIC - Abnormal; Notable for the following components:   APPearance CLOUDY (*)    Hgb urine dipstick SMALL (*)    Ketones, ur TRACE (*)    Protein, ur 30 (*)    Leukocytes,Ua SMALL (*)    Bacteria, UA MANY (*)    All other components within normal limits  SARS CORONAVIRUS 2 AG (30 MIN TAT)  SARS CORONAVIRUS 2 (TAT 6-24 HRS)  URINE CULTURE    EKG   Radiology DG Chest 2 View  Result Date: 04/06/2020 CLINICAL DATA:  Fever and chest pressure.  Body aches. EXAM: CHEST - 2 VIEW COMPARISON:  None. FINDINGS: The cardiomediastinal contours are normal. Mild bronchial thickening. Pulmonary vasculature is normal. No consolidation, pleural effusion, or pneumothorax. No acute osseous abnormalities are seen. IMPRESSION: Mild bronchial thickening. No pneumonia. Electronically Signed   By: 05/11/2020 M.D.   On: 04/06/2020 19:19    Procedures Procedures (including critical care time)  Medications Ordered in UC Medications  ibuprofen (ADVIL) tablet 800 mg (800 mg Oral Given 04/06/20 1803)    Initial Impression / Assessment and Plan / UC Course  I have reviewed the triage vital signs and the nursing notes. Covid test is neg. WBC is 12K She seems to have early UTI and Bronchitis. I placed her on Levaquin as noted. If she does not improve in 48-72h or gets worse, needs to go to ER Pertinent labs &  imaging results that were available during my care of the patient were reviewed by me and considered in my medical decision making (see chart for details).  Final Clinical  Impressions(s) / UC Diagnoses   Final diagnoses:  Acute bronchitis, unspecified organism  Febrile illness, acute  Acute cystitis without hematuria     Discharge Instructions     Push fluids and continue taking tylenol or Ibuprofen as needed for fever and body aches. Your covid test is negative. Your white count shows you have a bacteria infection.     ED Prescriptions    Medication Sig Dispense Auth. Provider   levofloxacin (LEVAQUIN) 500 MG tablet Take 1 tablet (500 mg total) by mouth daily. 7 tablet Rodriguez-Southworth, Nettie Elm, PA-C     PDMP not reviewed this encounter.   Garey Ham, Cordelia Poche 04/06/20 2149

## 2020-04-07 LAB — URINE CULTURE

## 2020-04-07 LAB — SARS CORONAVIRUS 2 (TAT 6-24 HRS): SARS Coronavirus 2: NEGATIVE

## 2020-06-18 ENCOUNTER — Other Ambulatory Visit: Payer: Self-pay

## 2020-06-18 ENCOUNTER — Ambulatory Visit
Admission: EM | Admit: 2020-06-18 | Discharge: 2020-06-18 | Disposition: A | Discharge status: Home / Self Care | Payer: Medicaid Other | Attending: Family Medicine | Admitting: Family Medicine

## 2020-06-18 DIAGNOSIS — U071 COVID-19: Secondary | ICD-10-CM | POA: Insufficient documentation

## 2020-06-18 LAB — GROUP A STREP BY PCR: Group A Strep by PCR: NOT DETECTED

## 2020-06-18 LAB — RESP PANEL BY RT-PCR (FLU A&B, COVID) ARPGX2
Influenza A by PCR: NEGATIVE
Influenza B by PCR: NEGATIVE
SARS Coronavirus 2 by RT PCR: POSITIVE — AB

## 2020-06-18 MED ORDER — CETIRIZINE-PSEUDOEPHEDRINE ER 5-120 MG PO TB12: 1.0000 | ORAL_TABLET | Freq: Two times a day (BID) | ORAL | 0 refills | Status: DC

## 2020-06-18 MED ORDER — IPRATROPIUM BROMIDE 0.06 % NA SOLN: 2.0000 | Freq: Four times a day (QID) | NASAL | 0 refills | Status: DC | PRN

## 2020-06-18 NOTE — Discharge Instructions (Signed)
We will call with the results.  Rest, lots of fluids.  Take care  Dr. Adriana Simas

## 2020-06-18 NOTE — ED Provider Notes (Signed)
[facility]    CSN: [id number] Arrival date & time: 06/18/20  1708  History   Chief Complaint Chief Complaint  Patient presents with  . Sore Throat  . Nasal Congestion   HPI  29 year old female presents with the above complaints.   Patient reports 2-day history of symptoms. Reports congestion, rhinorrhea, and sore throat. No documented fever. She is taken NyQuil without relief. No reported sick contacts. No relieving factors. No exacerbating factors. No other complaints.  Past Medical History:  Diagnosis Date  . Anemia    Patient Active Problem List   Diagnosis Date Noted  . Cervical high risk human papillomavirus (HPV) DNA test positive 07/28/2019  . Morbid obesity (HCC) 05/30/2018  . BMI 40.0-44.9, adult (HCC) 05/30/2018  . History of cesarean delivery 03/07/2016   Past Surgical History:  Procedure Laterality Date  . CESAREAN SECTION  2015  . CESAREAN SECTION N/A 03/07/2016   Procedure: CESAREAN SECTION;  Surgeon: Conard Novak, MD;  Location: [facility] ORS;  Service: Obstetrics;  Laterality: N/A;  . CESAREAN SECTION N/A 02/21/2018   Procedure: CESAREAN SECTION;  Surgeon: Natale Milch, MD;  Location: [facility] ORS;  Service: Obstetrics;  Laterality: N/A;  Time of Birth: 09:[birth year] Sex: Female Weight:   . CYST REMOVAL NECK     OB History    Gravida  6   Para  4   Term  4   Preterm      AB  1   Living  4     SAB  1   IAB      Ectopic      Multiple  0   Live Births  4            Home Medications    Prior to Admission medications   Medication Sig Start Date End Date Taking? Authorizing Provider  cetirizine-pseudoephedrine (ZYRTEC-D) 5-120 MG tablet Take 1 tablet by mouth 2 (two) times daily. 06/18/20   Everlene Other G, DO  ipratropium (ATROVENT) 0.06 % nasal spray Place 2 sprays into both nostrils 4 (four) times daily as needed for rhinitis. 06/18/20   Tommie Sams, DO  medroxyPROGESTERone Acetate 150 MG/ML SUSY INJECT ONE ML (150MG   TOTAL) INTO THE MUSCLE EVERY 3 MONTHS. 04/02/19 04/06/20  04/08/20, MD  phentermine (ADIPEX-P) 37.5 MG tablet Take 1 tablet (37.5 mg total) by mouth daily before breakfast. 05/30/18 04/06/20  04/08/20, MD    Family History Family History  Problem Relation Age of Onset  . Healthy Mother   . Healthy Father     Social History Social History   Tobacco Use  . Smoking status: Never Smoker  . Smokeless tobacco: Never Used  Vaping Use  . Vaping Use: Never used  Substance Use Topics  . Alcohol use: No  . Drug use: No     Allergies   Ampicillin   Review of Systems Review of Systems  Constitutional: Negative for fever.  HENT: Positive for congestion, rhinorrhea and sore throat.    Physical Exam Triage Vital Signs ED Triage Vitals  Enc Vitals Group     BP 06/18/20 1750 105/72     Pulse Rate 06/18/20 1750 88     Resp 06/18/20 1750 20     Temp 06/18/20 1750 99.7 F (37.6 C)     Temp Source 06/18/20 1750 Oral     SpO2 06/18/20 1750 100 %     Weight --      Height --  Head Circumference --      Peak Flow --      Pain Score 06/18/20 1747 0     Pain Loc --      Pain Edu? --      Excl. in GC? --    Updated Vital Signs BP 105/72 (BP Location: Left Arm)   Pulse 88   Temp 99.7 F (37.6 C) (Oral)   Resp 20   LMP 05/29/2020   SpO2 100%   Breastfeeding No   Visual Acuity Right Eye Distance:   Left Eye Distance:   Bilateral Distance:    Right Eye Near:   Left Eye Near:    Bilateral Near:     Physical Exam Vitals and nursing note reviewed.  Constitutional:      Appearance: Normal appearance. She is obese.  HENT:     Head: Normocephalic and atraumatic.     Nose: Congestion present.  Eyes:     General:        Right eye: No discharge.        Left eye: No discharge.     Conjunctiva/sclera: Conjunctivae normal.  Cardiovascular:     Rate and Rhythm: Normal rate and regular rhythm.     Heart sounds: No murmur heard.   Pulmonary:      Effort: Pulmonary effort is normal.     Breath sounds: Normal breath sounds. No wheezing, rhonchi or rales.  Neurological:     Mental Status: She is alert.  Psychiatric:        Mood and Affect: Mood normal.        Behavior: Behavior normal.    UC Treatments / Results  Labs (all labs ordered are listed, but only abnormal results are displayed) Labs Reviewed  RESP PANEL BY RT-PCR (FLU A&B, COVID) ARPGX2 - Abnormal; Notable for the following components:      Result Value   SARS Coronavirus 2 by RT PCR POSITIVE (*)    All other components within normal limits  GROUP A STREP BY PCR    EKG   Radiology No results found.  Procedures Procedures (including critical care time)  Medications Ordered in UC Medications - No data to display  Initial Impression / Assessment and Plan / UC Course  I have reviewed the triage vital signs and the nursing notes.  Pertinent labs & imaging results that were available during my care of the patient were reviewed by me and considered in my medical decision making (see chart for details).    29 year old female presents with a COVID-19.  Tested positive. Symptomatic treatment with Zyrtec-D and Atrovent. Information sent to the infusion clinic.  Final Clinical Impressions(s) / UC Diagnoses   Final diagnoses:  COVID     Discharge Instructions     We will call with the results.  Rest, lots of fluids.  Take care  Dr. Adriana Simas     ED Prescriptions    Medication Sig Dispense Auth. Provider   cetirizine-pseudoephedrine (ZYRTEC-D) 5-120 MG tablet Take 1 tablet by mouth 2 (two) times daily. 30 tablet Ankush Gintz G, DO   ipratropium (ATROVENT) 0.06 % nasal spray Place 2 sprays into both nostrils 4 (four) times daily as needed for rhinitis. 15 mL Tommie Sams, DO       [place], [place], [place] 06/18/20 2023

## 2020-06-18 NOTE — ED Triage Notes (Signed)
Pt is here with nasal congestion and sore throat that started 2 days ago, pt has taken NyQuil to relieve discomfort.

## 2020-06-19 ENCOUNTER — Other Ambulatory Visit: Payer: Self-pay | Admitting: Physician Assistant

## 2020-06-19 ENCOUNTER — Encounter: Payer: Self-pay | Admitting: Physician Assistant

## 2020-06-19 DIAGNOSIS — U071 COVID-19: Secondary | ICD-10-CM

## 2020-06-19 DIAGNOSIS — Z6841 Body Mass Index (BMI) 40.0 and over, adult: Secondary | ICD-10-CM

## 2020-06-19 NOTE — Progress Notes (Signed)
I connected by phone with Laura Lucero on 06/19/2020 at 12:49 PM to discuss the potential use of a new treatment for mild to moderate COVID-19 viral infection in non-hospitalized patients.  This patient is a 29 y.o. female that meets the FDA criteria for Emergency Use Authorization of COVID monoclonal antibody sotrovimab, casirivimab/imdevimab or bamlamivimab/estevimab.  Has a (+) direct SARS-CoV-2 viral test result  Has mild or moderate COVID-19   Is NOT hospitalized due to COVID-19  Is within 10 days of symptom onset  Has at least one of the high risk factor(s) for progression to severe COVID-19 and/or hospitalization as defined in EUA.  Specific high risk criteria : BMI > 25 and Other high risk medical condition per CDC:  high SVI   I have spoken and communicated the following to the patient or parent/caregiver regarding COVID monoclonal antibody treatment:  1. FDA has authorized the emergency use for the treatment of mild to moderate COVID-19 in adults and pediatric patients with positive results of direct SARS-CoV-2 viral testing who are 21 years of age and older weighing at least 40 kg, and who are at high risk for progressing to severe COVID-19 and/or hospitalization.  2. The significant known and potential risks and benefits of COVID monoclonal antibody, and the extent to which such potential risks and benefits are unknown.  3. Information on available alternative treatments and the risks and benefits of those alternatives, including clinical trials.  4. Patients treated with COVID monoclonal antibody should continue to self-isolate and use infection control measures (e.g., wear mask, isolate, social distance, avoid sharing personal items, clean and disinfect "high touch" surfaces, and frequent handwashing) according to CDC guidelines.   5. The patient or parent/caregiver has the option to accept or refuse COVID monoclonal antibody treatment.  After reviewing this information  with the patient, the patient has agreed to receive one of the available covid 19 monoclonal antibodies and will be provided an appropriate fact sheet prior to infusion.  Sx onset 12/7. Set up for infusion on 12/13 @ 10:30am. Directions given to [facility] Pt is aware that insurance will be charged an infusion fee. Pt is fully vaccinated. + in epic.   Cline Crock 06/19/2020 12:49 PM

## 2020-06-22 ENCOUNTER — Ambulatory Visit (HOSPITAL_COMMUNITY): Payer: Medicaid Other | Attending: Pulmonary Disease

## 2020-09-14 NOTE — Progress Notes (Signed)
Patient did not come for visit.

## 2020-10-27 ENCOUNTER — Ambulatory Visit
Admission: RE | Admit: 2020-10-27 | Discharge: 2020-10-27 | Disposition: A | Discharge status: Home / Self Care | Payer: Medicaid Other | Source: Ambulatory Visit | Attending: Emergency Medicine | Admitting: Emergency Medicine

## 2020-10-27 ENCOUNTER — Other Ambulatory Visit: Payer: Self-pay

## 2020-10-27 VITALS — BP 106/70 | HR 82 | Temp 96.8°F | Resp 16 | Wt 232.0 lb

## 2020-10-27 DIAGNOSIS — H5789 Other specified disorders of eye and adnexa: Secondary | ICD-10-CM

## 2020-10-27 MED ORDER — POLYMYXIN B-TRIMETHOPRIM 10000-0.1 UNIT/ML-% OP SOLN: 2.0000 [drp] | Freq: Four times a day (QID) | OPHTHALMIC | 0 refills | Status: DC

## 2020-10-27 NOTE — ED Triage Notes (Signed)
Patient presents to Urgent Care with complaints of right eye discharge this morning. Treating otc eye drops.   Denies fever or any changes in vision.

## 2020-10-27 NOTE — ED Provider Notes (Signed)
HPI  SUBJECTIVE:  Laura Lucero is a 30 y.o. female who presents with right eye crusting starting this morning.  Per daughter currently has purulent bilateral eye drainage.  No fevers, headaches, conjunctival injection, foreign body seen, periorbital erythema, edema, eye pain, pain with EOMs, visual changes.  No allergy or URI symptoms.  No nasal congestion, rhinorrhea, sneezing.  She tried saline drops with improvement in her symptoms.  No aggravating factors.  She does not wear glasses or contacts.    Past Medical History:  Diagnosis Date  . Anemia   . Morbid obesity (HCC)     Past Surgical History:  Procedure Laterality Date  . CESAREAN SECTION  2015  . CESAREAN SECTION N/A 03/07/2016   Procedure: CESAREAN SECTION;  Surgeon: Conard Novak, MD;  Location: [facility] ORS;  Service: Obstetrics;  Laterality: N/A;  . CESAREAN SECTION N/A 02/21/2018   Procedure: CESAREAN SECTION;  Surgeon: Natale Milch, MD;  Location: [facility] ORS;  Service: Obstetrics;  Laterality: N/A;  Time of Birth: 09:[birth year] Sex: Female Weight:   . CYST REMOVAL NECK      Family History  Problem Relation Age of Onset  . Healthy Mother   . Healthy Father     Social History   Tobacco Use  . Smoking status: Never Smoker  . Smokeless tobacco: Never Used  Vaping Use  . Vaping Use: Never used  Substance Use Topics  . Alcohol use: No  . Drug use: No    No current facility-administered medications for this encounter.  Current Outpatient Medications:  .  trimethoprim-polymyxin b (POLYTRIM) ophthalmic solution, Place 2 drops into both eyes every 6 (six) hours. 1-2 drops 4 times daily to affected eye/s X 5-7 days, Disp: 10 mL, Rfl: 0 .  cetirizine-pseudoephedrine (ZYRTEC-D) 5-120 MG tablet, Take 1 tablet by mouth 2 (two) times daily., Disp: 30 tablet, Rfl: 0 .  ipratropium (ATROVENT) 0.06 % nasal spray, Place 2 sprays into both nostrils 4 (four) times daily as needed for rhinitis., Disp: 15 mL, Rfl:  0  Allergies  Allergen Reactions  . Ampicillin Anaphylaxis    Has patient had a PCN reaction causing immediate rash, facial/tongue/throat swelling, SOB or lightheadedness with hypotension: Yes Has patient had a PCN reaction causing severe rash involving mucus membranes or skin necrosis: No Has patient had a PCN reaction that required hospitalization: No Has patient had a PCN reaction occurring within the last 10 years: Yes If all of the above answers are "NO", then may proceed with Cephalosporin use. Throat swelling     ROS  As noted in HPI.   Physical Exam  BP 106/70 (BP Location: Right Arm)   Pulse 82   Temp (!) 96.8 F (36 C) (Oral)   Resp 16   Wt 105.2 kg   LMP 10/05/2020   SpO2 100%   BMI 41.10 kg/m   Constitutional: Well developed, well nourished, no acute distress Eyes: PERRLA, EOMI, conjunctiva normal bilaterally.  No direct or consensual photophobia.  No discharge or drainage.  No periorbital erythema, edema.  No pain with EOMs.  No foreign body seen on lid eversion.   Visual Acuity  Right Eye Distance: 20/20 Left Eye Distance: 20/20 Bilateral Distance: 20/20  Right Eye Near:   Left Eye Near:    Bilateral Near:      HENT: Normocephalic, atraumatic,mucus membranes moist Respiratory: Normal inspiratory effort Cardiovascular: Normal rate GI: nondistended skin: No rash, skin intact Musculoskeletal: no deformities Neurologic: Alert & oriented x 3, no focal neuro  deficits Psychiatric: Speech and behavior appropriate   ED Course   Medications - No data to display  No orders of the defined types were placed in this encounter.   No results found for this or any previous visit (from the past 24 hour(s)). No results found.  ED Clinical Impression  1. Eye drainage      ED Assessment/Plan  Patient may have an early conjunctivitis since her daughter currently has symptoms, however, she is currently asymptomatic.  Her conjunctivae are clear here  today. will have her continue cool compresses, saline drops, meticulous hand hygiene.  We will send home with a prescription of Polytrim in case this turns into a conjunctivitis  Meds ordered this encounter  Medications  . trimethoprim-polymyxin b (POLYTRIM) ophthalmic solution    Sig: Place 2 drops into both eyes every 6 (six) hours. 1-2 drops 4 times daily to affected eye/s X 5-7 days    Dispense:  10 mL    Refill:  0      *This clinic note was created using Scientist, clinical (histocompatibility and immunogenetics). Therefore, there may be occasional mistakes despite careful proofreading.  ?    Domenick Gong, MD 10/27/20 1559

## 2020-10-27 NOTE — Progress Notes (Signed)
Appointment canceled.

## 2020-10-27 NOTE — Discharge Instructions (Addendum)
continue cool compresses, saline drops, meticulous hand hygiene. Polytrim in case this turns into a conjunctivitis-I would wait to fill this

## 2021-01-03 ENCOUNTER — Ambulatory Visit
Admission: EM | Admit: 2021-01-03 | Discharge: 2021-01-03 | Disposition: A | Discharge status: Home / Self Care | Payer: Medicaid Other | Attending: Physician Assistant | Admitting: Physician Assistant

## 2021-01-03 ENCOUNTER — Encounter: Payer: Self-pay | Admitting: Emergency Medicine

## 2021-01-03 DIAGNOSIS — O0001 Abdominal pregnancy with intrauterine pregnancy: Secondary | ICD-10-CM | POA: Insufficient documentation

## 2021-01-03 LAB — POCT PREGNANCY, URINE: Preg Test, Ur: POSITIVE — AB

## 2021-01-03 MED ORDER — PRENATAL 6.75-0.2 MG PO TABS: ORAL_TABLET | ORAL | 1 refills | Status: DC

## 2021-01-03 NOTE — ED Triage Notes (Signed)
Pt is present today with a missed period, and lower back pain,  and brown spotting. Pt states that she took a pregnancy test at home and it was positive

## 2021-01-03 NOTE — Discharge Instructions (Addendum)
If the brown discharge changes to bright red blood or you have cramps like labor, go to ER. Call Monday to your OB Dr and tell them what is going on.

## 2021-01-03 NOTE — ED Provider Notes (Signed)
[facility]    CSN: [id number] Arrival date & time: 01/03/21  1044      History   Chief Complaint Chief Complaint  Patient presents with   Back Pain   Amenorrhea    Spotting     HPI Laura Lucero is a 30 y.o. female who presents due to missing her cycle this month. Has been trying to get pregnancy with her monogamous partner since April this year. Did a pregnancy test at home and was positive. Her LMP  was 5/18. She denies abdominal cramps or bleeding. Has been having mild brown spotting for a few days. Denies abnormal vaginal discharge before this. Has had some low back aches. Has been pregnancy before and has not spotted before. Has been taking OTC chewable prenatal vitamins.     Past Medical History:  Diagnosis Date   Anemia    Morbid obesity [facility])     Patient Active Problem List   Diagnosis Date Noted   Cervical high risk human papillomavirus (HPV) DNA test positive 07/28/2019   Morbid obesity (HCC) 05/30/2018   BMI 40.0-44.9, adult (HCC) 05/30/2018   History of cesarean delivery 03/07/2016    Past Surgical History:  Procedure Laterality Date   CESAREAN SECTION  2015   CESAREAN SECTION N/A 03/07/2016   Procedure: CESAREAN SECTION;  Surgeon: Conard Novak, MD;  Location: [facility] ORS;  Service: Obstetrics;  Laterality: N/A;   CESAREAN SECTION N/A 02/21/2018   Procedure: CESAREAN SECTION;  Surgeon: Natale Milch, MD;  Location: [facility] ORS;  Service: Obstetrics;  Laterality: N/A;  Time of Birth: 09:09 Sex: Female Weight:    CYST REMOVAL NECK      OB History     Gravida  6   Para  4   Term  4   Preterm      AB  1   Living  4      SAB  1   IAB      Ectopic      Multiple  0   Live Births  4            Home Medications    Prior to Admission medications   Medication Sig Start Date End Date Taking? Authorizing Provider  Prenatal 6.75-0.2 MG TABS One qd 01/03/21  Yes Rodriguez-Southworth, Nettie Elm, PA-C   cetirizine-pseudoephedrine (ZYRTEC-D) 5-120 MG tablet Take 1 tablet by mouth 2 (two) times daily. 06/18/20   Tommie Sams, DO  medroxyPROGESTERone Acetate 150 MG/ML SUSY INJECT ONE ML (150MG  TOTAL) INTO THE MUSCLE EVERY 3 MONTHS. 04/02/19 04/06/20  04/08/20, MD  phentermine (ADIPEX-P) 37.5 MG tablet Take 1 tablet (37.5 mg total) by mouth daily before breakfast. 05/30/18 04/06/20  04/08/20, MD    Family History Family History  Problem Relation Age of Onset   Healthy Mother    Healthy Father     Social History Social History   Tobacco Use   Smoking status: Never   Smokeless tobacco: Never  Vaping Use   Vaping Use: Never used  Substance Use Topics   Alcohol use: No   Drug use: No     Allergies   Ampicillin   Review of Systems Review of Systems + for brown vaginal discharge, and mild low back ache. The rest is neg.   Physical Exam Triage Vital Signs ED Triage Vitals  Enc Vitals Group     BP 01/03/21 1057 112/67     Pulse Rate 01/03/21 1057 75  Resp 01/03/21 1057 18     Temp 01/03/21 1057 98.7 F (37.1 C)     Temp src --      SpO2 01/03/21 1057 100 %     Weight --      Height --      Head Circumference --      Peak Flow --      Pain Score 01/03/21 1055 2     Pain Loc --      Pain Edu? --      Excl. in GC? --    No data found.  Updated Vital Signs BP 112/67 (BP Location: Left Arm)   Pulse 75   Temp 98.7 F (37.1 C)   Resp 18   LMP 11/25/2020   SpO2 100%   Visual Acuity Right Eye Distance:   Left Eye Distance:   Bilateral Distance:    Right Eye Near:   Left Eye Near:    Bilateral Near:     Physical Exam Vitals and nursing note reviewed.  Constitutional:      General: She is not in acute distress.    Appearance: She is obese. She is not toxic-appearing.  HENT:     Right Ear: External ear normal.     Left Ear: External ear normal.  Eyes:     Conjunctiva/sclera: Conjunctivae normal.  Pulmonary:     Effort: Pulmonary  effort is normal.  Abdominal:     General: Bowel sounds are normal.     Palpations: Abdomen is soft. There is no mass.     Tenderness: There is no abdominal tenderness. There is no guarding or rebound.  Neurological:     Mental Status: She is alert and oriented to person, place, and time.     Gait: Gait normal.  Psychiatric:        Mood and Affect: Mood normal.        Behavior: Behavior normal.        Thought Content: Thought content normal.        Judgment: Judgment normal.     UC Treatments / Results  Labs (all labs ordered are listed, but only abnormal results are displayed) Labs Reviewed  POCT PREGNANCY, URINE - Abnormal; Notable for the following components:      Result Value   Preg Test, Ur POSITIVE (*)    All other components within normal limits  PREGNANCY, URINE    EKG   Radiology No results found.  Procedures Procedures (including critical care time)  Medications Ordered in UC Medications - No data to display  Initial Impression / Assessment and Plan / UC Course  I have reviewed the triage vital signs and the nursing notes. Pertinent labs results that were available during my care of the patient were reviewed by me and considered in my medical decision making (see chart for details). 5 week pregnancy. Due date 09/01/2021 Prenatal vitamins were sent. See instructions.   Final Clinical Impressions(s) / UC Diagnoses   Final diagnoses:  Abdominal pregnancy with intrauterine pregnancy     Discharge Instructions      If the brown discharge changes to bright red blood or you have cramps like labor, go to ER. Call Monday to your OB Dr and tell them what is going on.      ED Prescriptions     Medication Sig Dispense Auth. Provider   Prenatal 6.75-0.2 MG TABS One qd 90 tablet Rodriguez-Southworth, Nettie Elm, PA-C      PDMP not reviewed this  encounter.   Garey Ham, Cordelia Poche 01/03/21 1218

## 2021-01-04 ENCOUNTER — Other Ambulatory Visit (HOSPITAL_COMMUNITY)
Admission: RE | Admit: 2021-01-04 | Discharge: 2021-01-04 | Disposition: A | Discharge status: Home / Self Care | Payer: Medicaid Other | Source: Ambulatory Visit | Attending: Obstetrics and Gynecology | Admitting: Obstetrics and Gynecology

## 2021-01-04 ENCOUNTER — Encounter: Payer: Self-pay | Admitting: Obstetrics and Gynecology

## 2021-01-04 ENCOUNTER — Ambulatory Visit (INDEPENDENT_AMBULATORY_CARE_PROVIDER_SITE_OTHER): Payer: Medicaid Other | Admitting: Obstetrics and Gynecology

## 2021-01-04 ENCOUNTER — Other Ambulatory Visit: Payer: Self-pay

## 2021-01-04 VITALS — BP 118/72 | Ht 63.0 in | Wt 241.0 lb

## 2021-01-04 DIAGNOSIS — Z113 Encounter for screening for infections with a predominantly sexual mode of transmission: Secondary | ICD-10-CM

## 2021-01-04 DIAGNOSIS — O209 Hemorrhage in early pregnancy, unspecified: Secondary | ICD-10-CM | POA: Diagnosis not present

## 2021-01-04 DIAGNOSIS — Z124 Encounter for screening for malignant neoplasm of cervix: Secondary | ICD-10-CM | POA: Diagnosis not present

## 2021-01-04 DIAGNOSIS — O3680X0 Pregnancy with inconclusive fetal viability, not applicable or unspecified: Secondary | ICD-10-CM

## 2021-01-04 NOTE — Progress Notes (Signed)
Obstetric Problem Visit    Chief Complaint: No chief complaint on file.   History of Present Illness: Patient is a 30 y.o. [id number] Patient's last menstrual period was 11/25/2020.  for first trimester bleeding.  The onset of bleeding was one day ago. She has not established prental care yet has follow up in the next week  Is bleeding equal to or greater than normal menstrual flow:  No Any recent trauma:  No Recent intercourse:  No Prior ultrasound demonstrating IUP: none.  Prior ultrasound demonstrating viable IUP:  No Prior Serum HCG:  No Rh status: A POS   Was seen on 01/03/2021 at urgent care positive urine pregnancy test no quantitative HCG.  Bleeding limited to spotting has since stopped.  No abdominal pain or cramping.  Review of Systems: ROS  Past Medical History:  Patient Active Problem List   Diagnosis Date Noted   Cervical high risk human papillomavirus (HPV) DNA test positive 07/28/2019   Morbid obesity (HCC) 05/30/2018   BMI 40.0-44.9, adult (HCC) 05/30/2018   History of cesarean delivery 03/07/2016    Desires TOLAC [ ]  Review risks of uterine rupture      Past Surgical History:  Past Surgical History:  Procedure Laterality Date   CESAREAN SECTION  2015   CESAREAN SECTION N/A 03/07/2016   Procedure: CESAREAN SECTION;  Surgeon: 03/09/2016, MD;  Location: [facility] ORS;  Service: Obstetrics;  Laterality: N/A;   CESAREAN SECTION N/A 02/21/2018   Procedure: CESAREAN SECTION;  Surgeon: 02/23/2018, MD;  Location: [facility] ORS;  Service: Obstetrics;  Laterality: N/A;  Time of Birth: 09:09 Sex: Female Weight:    CYST REMOVAL NECK      Obstetric History: Natale Milch  Family History:  Family History  Problem Relation Age of Onset   Healthy Mother    Healthy Father     Social History:  Social History   Socioeconomic History   Marital status: Single    Spouse name: Not on file   Number of children: Not on file   Years of education: Not on file    Highest education level: Not on file  Occupational History   Not on file  Tobacco Use   Smoking status: Never   Smokeless tobacco: Never  Vaping Use   Vaping Use: Never used  Substance and Sexual Activity   Alcohol use: No   Drug use: No   Sexual activity: Yes    Partners: Male    Birth control/protection: None  Other Topics Concern   Not on file  Social History Narrative   Not on file   Social Determinants of Health   Financial Resource Strain: Not on file  Food Insecurity: Not on file  Transportation Needs: Not on file  Physical Activity: Not on file  Stress: Not on file  Social Connections: Not on file  Intimate Partner Violence: Not on file    Allergies:  Allergies  Allergen Reactions   Ampicillin Anaphylaxis    Has patient had a PCN reaction causing immediate rash, facial/tongue/throat swelling, SOB or lightheadedness with hypotension: Yes Has patient had a PCN reaction causing severe rash involving mucus membranes or skin necrosis: No Has patient had a PCN reaction that required hospitalization: No Has patient had a PCN reaction occurring within the last 10 years: Yes If all of the above answers are "NO", then may proceed with Cephalosporin use. Throat swelling    Medications: Prior to Admission medications   Medication Sig Start Date End Date  Taking? Authorizing Provider  Prenatal 6.75-0.2 MG TABS One qd 01/03/21  Yes Rodriguez-Southworth, Nettie Elm, PA-C  medroxyPROGESTERone Acetate 150 MG/ML SUSY INJECT ONE ML (150MG  TOTAL) INTO THE MUSCLE EVERY 3 MONTHS. 04/02/19 04/06/20  04/08/20, MD  phentermine (ADIPEX-P) 37.5 MG tablet Take 1 tablet (37.5 mg total) by mouth daily before breakfast. 05/30/18 04/06/20  04/08/20, MD    Physical Exam Vitals: Blood pressure 118/72, height 5\' 3"  (1.6 m), weight 241 lb (109.3 kg), last menstrual period 11/25/2020.  General: NAD HEENT: normocephalic, anicteric Neck: Thyroid non-enlarged, no  nodules Pulmonary: No increased work of breathing, Abdomen: NABS, soft, non-tender, non-distended.  Umbilicus without lesions.  No hepatomegaly, splenomegaly or masses palpable. No evidence of hernia  Genitourinary:  External: Normal external female genitalia.  Normal urethral meatus, normal Bartholin's and Skene's glands.    Vagina: Normal vaginal mucosa, no evidence of prolapse.    Cervix: closed no bleeding  Uterus: non-enlarged, non-tender  Adnexa: ovaries non-enlarged, no adnexal masses  Rectal: deferred Extremities: no edema, erythema, or tenderness Neurologic: Grossly intact Psychiatric: mood appropriate, affect full  Assessment: 30 y.o. 11/27/2020 Unknown presenting for evaluation of first trimester vaginal bleeding  Plan: Problem List Items Addressed This Visit   None Visit Diagnoses     First trimester bleeding    -  Primary   Relevant Orders   Beta hCG quant (ref lab) (Completed)   Progesterone (Completed)   Pregnancy, location unknown       Relevant Orders   Beta hCG quant (ref lab) (Completed)   Progesterone (Completed)   Pregnancy with inconclusive fetal viability, single or unspecified fetus       Relevant Orders   Beta hCG quant (ref lab) (Completed)   Progesterone (Completed)   Screening for malignant neoplasm of cervix       Relevant Orders   Cytology - PAP (Completed)   Routine screening for STI (sexually transmitted infection)           1) First trimester bleeding - incidence and clinical course of first trimester bleeding is discussed in detail with the patient today.  Approximately 1/3 of pregnancies ending in live births experienced 1st trimester bleeding.  The amount of bleeding is variable and not necessarily predictive of outcome.  Sources may be cervical or uterine.  Subchorionic hemorrhages are a frequent concurrent findings on ultrasound and are followed expectantly.  These often absorb or regress spontaneously although risk for expansion and further  disruption of the utero-placental interface leading to miscarriage is possible.  There is no clearly documented benefit to limiting or modifying activity and sexual intercourse in altering clinic course of 1st trimester bleeding.   - HCG and progesterone today with repeat in 48-hrs 2) If no already done will proceed with TVUS evaluation to document viability, and if uncertain viability or absence of a demonstrable IUP (and no previous documentation of IUP) will trend HCG levels.  3) The patient is A POS  rhogam is therefore not ndicated to decrease the risk rhesus alloimmunization.    4) Routine bleeding precautions were discussed with the patient prior the conclusion of today's visit.    38, MD, [id number] OB/GYN, [facility] 01/04/2021, 4:21 PM

## 2021-01-05 LAB — PROGESTERONE: Progesterone: 2.9 ng/mL

## 2021-01-05 LAB — BETA HCG QUANT (REF LAB): hCG Quant: 824 m[IU]/mL

## 2021-01-06 LAB — CYTOLOGY - PAP
Chlamydia: NEGATIVE
Comment: NEGATIVE
Comment: NORMAL
Diagnosis: NEGATIVE
Neisseria Gonorrhea: NEGATIVE

## 2021-01-07 ENCOUNTER — Encounter: Payer: Self-pay | Admitting: Emergency Medicine

## 2021-01-07 ENCOUNTER — Emergency Department
Admission: EM | Admit: 2021-01-07 | Discharge: 2021-01-07 | Disposition: A | Discharge status: Home / Self Care | Payer: Medicaid Other | Attending: Emergency Medicine | Admitting: Emergency Medicine

## 2021-01-07 ENCOUNTER — Inpatient Hospital Stay (HOSPITAL_COMMUNITY)
Admission: EM | Admit: 2021-01-07 | Discharge: 2021-01-08 | Disposition: A | Discharge status: Home / Self Care | Payer: Medicaid Other | Attending: Obstetrics & Gynecology | Admitting: Obstetrics & Gynecology

## 2021-01-07 ENCOUNTER — Emergency Department: Payer: Medicaid Other

## 2021-01-07 ENCOUNTER — Telehealth: Payer: Self-pay

## 2021-01-07 ENCOUNTER — Other Ambulatory Visit: Payer: Self-pay

## 2021-01-07 ENCOUNTER — Encounter (HOSPITAL_COMMUNITY): Payer: Self-pay | Admitting: Obstetrics & Gynecology

## 2021-01-07 DIAGNOSIS — O209 Hemorrhage in early pregnancy, unspecified: Secondary | ICD-10-CM | POA: Diagnosis present

## 2021-01-07 DIAGNOSIS — Z3A01 Less than 8 weeks gestation of pregnancy: Secondary | ICD-10-CM | POA: Diagnosis not present

## 2021-01-07 DIAGNOSIS — O200 Threatened abortion: Secondary | ICD-10-CM | POA: Insufficient documentation

## 2021-01-07 DIAGNOSIS — O4690 Antepartum hemorrhage, unspecified, unspecified trimester: Secondary | ICD-10-CM

## 2021-01-07 LAB — HCG, QUANTITATIVE, PREGNANCY: hCG, Beta Chain, Quant, S: 1139 m[IU]/mL — ABNORMAL HIGH (ref <5)

## 2021-01-07 LAB — ABO/RH: ABO/RH(D): A POS

## 2021-01-07 LAB — POC URINE PREG, ED: Preg Test, Ur: POSITIVE — AB

## 2021-01-07 NOTE — Discharge Instructions (Addendum)
Keep your appointment with [facility] for repeat ultrasound and beta hCG as suggested by radiology for 10 to 14 days.  As we already discussed this may be a nonviable pregnancy.  Return to the emergency department if any severe worsening of your symptoms such as increased bleeding or severe abdominal cramping.

## 2021-01-07 NOTE — Telephone Encounter (Signed)
Pt calling; wants call back.  [phone]  Pt states she is confused from her ED appt; according to u/s (had vag and abd) she is 7-8wks, no placenta or umbilical cord; has appt on the 13th and was told to keep that appt for f/u from ED.  Offered to be seen for ED f/u before 13th; pt accepted; tx'd to [facility] for scheduling.

## 2021-01-07 NOTE — ED Provider Notes (Signed)
Emergency Medicine Provider OB Triage Evaluation Note  Laura Lucero is a 30 y.o. female, [id number], at Unknown gestation who presents to the emergency department with complaints of vaginal bleeding.  Patient had vaginal bleeding last week that was described as spotting.  Patient went to urgent care last week was told to follow-up with her OB/GYN provider on Monday.  Patient saw her OB/GYN at [facility] in [place] had lab work done and Pap smear.  Patient had resolution of her spotting.  Today she started having vaginal bleeding again.  Patient reports that it is heavier spotting.  Patient reports that she is only going through 1 pad since this morning.  Patient does have positive pregnancy test.  LMP 5/18.  G6 [id number]  Review of  Systems  Positive: Vaginal bleeding Negative: Abdominal pain, nausea, vomiting, dysuria, vaginal discharge  Physical Exam  BP (!) 100/57 (BP Location: Left Arm)   Pulse 91   Temp 98.7 F (37.1 C) (Oral)   Resp 18   SpO2 100%  General: Awake, no distress  HEENT: Atraumatic  Resp: Normal effort  Cardiac: Normal rate Abd: Nondistended, nontender  MSK: Moves all extremities without difficulty Neuro: Speech clear  Medical Decision Making  Pt evaluated for pregnancy concern and is stable for transfer to MAU. Pt is in agreement with plan for transfer.  8:31 PM Discussed with MAU APP, Byrd Hesselbach , who accepts patient in transfer.  Clinical Impression  No diagnosis found.     Haskel Schroeder, PA-C 01/07/21 2031    Melene Plan, DO 01/07/21 2210

## 2021-01-07 NOTE — MAU Note (Signed)
Pt transferred from [facility]. Started having  spotting  last week went to Urgent care and told it was implantation bleeding. Told to f/u with  OBGYn. Went on Monday and had a PAP smear. Has been having some spotting on and off all week. Today bleeding was heavier and it was red/brown. Went to [facility] and had u/s but they could not see anything. Bleeding now is now brown and not as much. Denies any pain or cramping.

## 2021-01-07 NOTE — ED Notes (Signed)
See triage note   Presents with some vaginal bleeding   States she had some spotting on Sunday  Then was seen by her OB/GYN  Spotting at that time  States she stopped bleeding  Then noticed bleeding this am

## 2021-01-07 NOTE — MAU Provider Note (Signed)
Chief Complaint: Hematuria and Vaginal Bleeding   Event Date/Time   First Provider Initiated Contact with Patient 01/07/21 2336        SUBJECTIVE HPI: Laura Lucero is a 30 y.o. [id number] at [redacted]w[redacted]d by LMP who presents to maternity admissions reporting bleeding.  Was seen today at [facility] ED and labs and [place] were done.  Patient did not really understand what the diagnosis or plan was so she came here. . She denies vaginal itching/burning, urinary symptoms, h/a, dizziness, n/v, or fever/chills.    Vaginal Bleeding The patient's primary symptoms include vaginal bleeding. The patient's pertinent negatives include no genital itching, genital lesions, genital odor or pelvic pain. This is a recurrent problem. The current episode started in the past 7 days. The problem has been unchanged. The patient is experiencing no pain. She is pregnant. Pertinent negatives include no abdominal pain, back pain, chills, constipation, diarrhea, dysuria, fever, headaches, nausea or vomiting. The vaginal discharge was bloody. The vaginal bleeding is lighter than menses. She has been passing clots (once). She has not been passing tissue. Nothing aggravates the symptoms. She has tried nothing for the symptoms.   RN Note: Pt transferred from [facility]. Started having  spotting  last week went to Urgent care and told it was implantation bleeding. Told to f/u with  OBGYn. Went on Monday and had a PAP smear. Has been having some spotting on and off all week. Today bleeding was heavier and it was red/brown. Went to [facility] and had u/s but they could not see anything. Bleeding now is now brown and not as much. Denies any pain or cramping.  Past Medical History:  Diagnosis Date   Anemia    Morbid obesity [facility])    Past Surgical History:  Procedure Laterality Date   CESAREAN SECTION  2015   CESAREAN SECTION N/A 03/07/2016   Procedure: CESAREAN SECTION;  Surgeon: Conard Novak, MD;  Location: [facility] ORS;  Service:  Obstetrics;  Laterality: N/A;   CESAREAN SECTION N/A 02/21/2018   Procedure: CESAREAN SECTION;  Surgeon: Natale Milch, MD;  Location: [facility] ORS;  Service: Obstetrics;  Laterality: N/A;  Time of Birth: 09:09 Sex: Female Weight:    CYST REMOVAL NECK     Social History   Socioeconomic History   Marital status: Single    Spouse name: Not on file   Number of children: Not on file   Years of education: Not on file   Highest education level: Not on file  Occupational History   Not on file  Tobacco Use   Smoking status: Never   Smokeless tobacco: Never  Vaping Use   Vaping Use: Never used  Substance and Sexual Activity   Alcohol use: No   Drug use: No   Sexual activity: Yes    Partners: Male    Birth control/protection: None  Other Topics Concern   Not on file  Social History Narrative   Not on file   Social Determinants of Health   Financial Resource Strain: Not on file  Food Insecurity: Not on file  Transportation Needs: Not on file  Physical Activity: Not on file  Stress: Not on file  Social Connections: Not on file  Intimate Partner Violence: Not on file   No current facility-administered medications on file prior to encounter.   Current Outpatient Medications on File Prior to Encounter  Medication Sig Dispense Refill   Prenatal 6.75-0.2 MG TABS One qd 90 tablet 1   [DISCONTINUED] medroxyPROGESTERone Acetate 150 MG/ML  SUSY INJECT ONE ML (150MG  TOTAL) INTO THE MUSCLE EVERY 3 MONTHS. 1 mL 0   [DISCONTINUED] phentermine (ADIPEX-P) 37.5 MG tablet Take 1 tablet (37.5 mg total) by mouth daily before breakfast. 30 tablet 0   Allergies  Allergen Reactions   Ampicillin Anaphylaxis    Has patient had a PCN reaction causing immediate rash, facial/tongue/throat swelling, SOB or lightheadedness with hypotension: Yes Has patient had a PCN reaction causing severe rash involving mucus membranes or skin necrosis: No Has patient had a PCN reaction that required  hospitalization: No Has patient had a PCN reaction occurring within the last 10 years: Yes If all of the above answers are "NO", then may proceed with Cephalosporin use. Throat swelling    I have reviewed patient's Past Medical Hx, Surgical Hx, Family Hx, Social Hx, medications and allergies.   ROS:  Review of Systems  Constitutional:  Negative for chills and fever.  Gastrointestinal:  Negative for abdominal pain, constipation, diarrhea, nausea and vomiting.  Genitourinary:  Negative for dysuria and pelvic pain.  Musculoskeletal:  Negative for back pain.  Neurological:  Negative for headaches.  Review of Systems  Other systems negative   Physical Exam  Physical Exam Patient Vitals for the past 24 hrs:  BP Temp Temp src Pulse Resp SpO2 Height Weight  01/07/21 2306 112/71 -- -- 79 18 -- 5\' 3"  (1.6 m) 109.8 kg  01/07/21 2005 (!) 100/57 98.7 F (37.1 C) Oral 91 18 100 % -- --   Constitutional: Well-developed, well-nourished female in no acute distress.  Cardiovascular: normal rate Respiratory: normal effort GI: Abd soft, non-tender. Pos BS x 4 MS: Extremities nontender, no edema, normal ROM Neurologic: Alert and oriented x 4.  GU: Neg CVAT.  PELVIC EXAM: deferred  LAB RESULTS Results for orders placed or performed during the hospital encounter of 01/07/21 (from the past 24 hour(s))  hCG, quantitative, pregnancy     Status: Abnormal   Collection Time: 01/07/21 10:10 AM  Result Value Ref Range   hCG, Beta Chain, Quant, S 1,139 (H) <5 mIU/mL  ABO/Rh     Status: None   Collection Time: 01/07/21 10:10 AM  Result Value Ref Range   ABO/RH(D)      A POS Performed at [facility], [address], [place], [address] [place]   POC urine preg, ED     Status: Abnormal   Collection Time: 01/07/21 10:55 AM  Result Value Ref Range   Preg Test, Ur Positive (A) Negative    --/--/A POS Performed at [facility], [address] [place], [address] [place]  (06/30  1010)  IMAGING [address] OB LESS THAN 14 WEEKS WITH OB TRANSVAGINAL  Result Date: 01/07/2021 CLINICAL DATA:  Vaginal bleeding EXAM: OBSTETRIC <14 WK [place] AND TRANSVAGINAL OB 01/09/2021 TECHNIQUE: Both transabdominal and transvaginal ultrasound examinations were performed for complete evaluation of the gestation as well as the maternal uterus, adnexal regions, and pelvic cul-de-sac. Transvaginal technique was performed to assess early pregnancy. COMPARISON:  None. FINDINGS: Intrauterine gestational sac: Visualized-single. Gestational sac elongated Yolk sac:  Not visualized Embryo: Not convincingly seen. There is ill-defined soft tissue in the gestational sac which cannot be classified at this time as a fetal pole. Cardiac Activity: None MSD: 28 mm in longest diameter. Note that sac is elongated which may result in over estimation of true diameter. 7 w 6 d Subchorionic hemorrhage:  None visualized. Maternal uterus/adnexae: Cervical os closed. Right ovary measures 2.9 x 1.9 x 1.6 cm. Left ovary measures  3.0 x 1.7 x 1.6 cm. No extrauterine pelvic mass or fluid. IMPRESSION: 1. Intrauterine gestational sac seen. Based on gestational sac size, estimated gestational age is approximately 8 weeks. There is a somewhat ill-defined structure within the gestational sac which cannot be deemed with certainty as representing a fetal pole. No cardiac activity associated with this somewhat ill-defined opacity. First-trimester pregnancy failure is suspected. Given the somewhat questionable appearance of the soft tissue opacity in the gestational sac, a follow-up ultrasound in approximately 10-14 days is felt to be advised to further evaluate. 2.  Study otherwise unremarkable. Electronically Signed   By: Bretta Bang III M.D.   On: 01/07/2021 12:53    MAU Management/MDM: Reviewed lab and US findings from other visit Discussed process of ruling out ectopic/SAB DIscussed there are features of a failed pregnancy, but that we cannot say for  sure. Recommend keep appt for next week for HCG and repeat US 7-10 days. Discussed if HCG does not rise significantly, pregnancy is probably not viable  ASSESSMENT Pregnancy at [redacted]w[redacted]d by LMP Bleeding in early pregnancy Threatened abortion  PLAN Discharge home Plan to repeat HCG level Next week as scheduled Pt stable at time of discharge. Encouraged to return here if she develops worsening of symptoms, increase in pain, fever, or other concerning symptoms.    Wynelle Bourgeois CNM, MSN Certified Nurse-Midwife 01/07/2021  11:36 PM

## 2021-01-07 NOTE — ED Provider Notes (Signed)
[facility] Emergency Department Provider Note  ____________________________________________   Event Date/Time   First MD Initiated Contact with Patient 01/07/21 1048     (approximate)  I have reviewed the triage vital signs and the nursing notes.   HISTORY  Chief Complaint Vaginal Bleeding   HPI Laura Lucero is a 30 y.o. female presents to the ED with complaint of vaginal bleeding.  She is on the impression that she is probably [redacted] weeks pregnant.  She began noticing some spotting and was seen at urgent care where her beta hCG was 824 on 01/04/2021.  Patient has continued to have some spotting but denies cramping at this time.  She called her OB/GYN who referred her to the emergency department.  Currently she rates her pain as a 0/10.         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 positive 07/28/2019   Morbid obesity (HCC) 05/30/2018   BMI 40.0-44.9, adult (HCC) 05/30/2018   History of cesarean delivery 03/07/2016    Past Surgical History:  Procedure Laterality Date   CESAREAN SECTION  2015   CESAREAN SECTION N/A 03/07/2016   Procedure: CESAREAN SECTION;  Surgeon: Conard Novak, MD;  Location: [facility] ORS;  Service: Obstetrics;  Laterality: N/A;   CESAREAN SECTION N/A 02/21/2018   Procedure: CESAREAN SECTION;  Surgeon: Natale Milch, MD;  Location: [facility] ORS;  Service: Obstetrics;  Laterality: N/A;  Time of Birth: 09:09 Sex: Female Weight:    CYST REMOVAL NECK      Prior to Admission medications   Medication Sig Start Date End Date Taking? Authorizing Provider  Prenatal 6.75-0.2 MG TABS One qd 01/03/21   Rodriguez-Southworth, Nettie Elm, PA-C  medroxyPROGESTERone Acetate 150 MG/ML SUSY INJECT ONE ML (150MG  TOTAL) INTO THE MUSCLE EVERY 3 MONTHS. 04/02/19 04/06/20  04/08/20, MD   phentermine (ADIPEX-P) 37.5 MG tablet Take 1 tablet (37.5 mg total) by mouth daily before breakfast. 05/30/18 04/06/20  04/08/20, MD    Allergies Ampicillin  Family History  Problem Relation Age of Onset   Healthy Mother    Healthy Father     Social History Social History   Tobacco Use   Smoking status: Never   Smokeless tobacco: Never  Vaping Use   Vaping Use: Never used  Substance Use Topics   Alcohol use: No   Drug use: No    Review of Systems Constitutional: No fever/chills Cardiovascular: Denies chest pain. Respiratory: Denies shortness of breath. Gastrointestinal: No abdominal pain.  No nausea, no vomiting.   Genitourinary: Negative for dysuria.  Positive for vaginal bleeding. Musculoskeletal: Negative for musculoskeletal pain. Skin: Negative for rash. Neurological: Negative for headaches, focal weakness or numbness.   ____________________________________________   PHYSICAL EXAM:  VITAL SIGNS: ED Triage Vitals  Enc Vitals Group     BP 01/07/21 1006 136/85     Pulse Rate 01/07/21 1006 85     Resp 01/07/21 1006 17     Temp 01/07/21 1006 98.6 F (37 C)     Temp Source 01/07/21 1006 Oral     SpO2 01/07/21 1006 98 %     Weight 01/07/21 1007 240 lb 4.8 oz (109 kg)     Height 01/07/21 1007 5\' 3"  (1.6 m)     Head Circumference --  Peak Flow --      Pain Score 01/07/21 1007 0     Pain Loc --      Pain Edu? --      Excl. in GC? --     Constitutional: Alert and oriented. Well appearing and in no acute distress. Eyes: Conjunctivae are normal.  Head: Atraumatic. Neck: No stridor.   Cardiovascular: Normal rate, regular rhythm. Grossly normal heart sounds.  Good peripheral circulation. Respiratory: Normal respiratory effort.  No retractions. Lungs CTAB. Gastrointestinal: Soft and nontender. No distention.  Bowel sounds normoactive x4 quadrants. Musculoskeletal: Moves upper and lower extremities any difficulty.  Normal gait was  noted. Neurologic:  Normal speech and language. No gross focal neurologic deficits are appreciated.  Skin:  Skin is warm, dry and intact. No rash noted. Psychiatric: Mood and affect are normal. Speech and behavior are normal.  ____________________________________________   LABS (all labs ordered are listed, but only abnormal results are displayed)  Labs Reviewed  HCG, QUANTITATIVE, PREGNANCY - Abnormal; Notable for the following components:      Result Value   hCG, Beta Chain, Quant, S 1,139 (*)    All other components within normal limits  POC URINE PREG, ED - Abnormal; Notable for the following components:   Preg Test, Ur Positive (*)    All other components within normal limits  ABO/RH   ____________________________________________  EKG   ____________________________________________  RADIOLOGY Beaulah Corin, personally viewed and evaluated these images (plain radiographs) as part of my medical decision making, as well as reviewing the written report by the radiologist.   Official radiology report(s): US OB LESS THAN 14 WEEKS WITH OB TRANSVAGINAL  Result Date: 01/07/2021 CLINICAL DATA:  Vaginal bleeding EXAM: OBSTETRIC <14 WK [place] AND TRANSVAGINAL OB US TECHNIQUE: Both transabdominal and transvaginal ultrasound examinations were performed for complete evaluation of the gestation as well as the maternal uterus, adnexal regions, and pelvic cul-de-sac. Transvaginal technique was performed to assess early pregnancy. COMPARISON:  None. FINDINGS: Intrauterine gestational sac: Visualized-single. Gestational sac elongated Yolk sac:  Not visualized Embryo: Not convincingly seen. There is ill-defined soft tissue in the gestational sac which cannot be classified at this time as a fetal pole. Cardiac Activity: None MSD: 28 mm in longest diameter. Note that sac is elongated which may result in over estimation of true diameter. 7 w 6 d Subchorionic hemorrhage:  None visualized. Maternal  uterus/adnexae: Cervical os closed. Right ovary measures 2.9 x 1.9 x 1.6 cm. Left ovary measures 3.0 x 1.7 x 1.6 cm. No extrauterine pelvic mass or fluid. IMPRESSION: 1. Intrauterine gestational sac seen. Based on gestational sac size, estimated gestational age is approximately 8 weeks. There is a somewhat ill-defined structure within the gestational sac which cannot be deemed with certainty as representing a fetal pole. No cardiac activity associated with this somewhat ill-defined opacity. First-trimester pregnancy failure is suspected. Given the somewhat questionable appearance of the soft tissue opacity in the gestational sac, a follow-up ultrasound in approximately 10-14 days is felt to be advised to further evaluate. 2.  Study otherwise unremarkable. Electronically Signed   By: Bretta Bang III M.D.   On: 01/07/2021 12:53    ____________________________________________   PROCEDURES  Procedure(s) performed (including Critical Care):  Procedures   ____________________________________________   INITIAL IMPRESSION / ASSESSMENT AND PLAN / ED COURSE  As part of my medical decision making, I reviewed the following data within the electronic MEDICAL RECORD NUMBER Notes from prior ED visits and [place] Controlled Substance Database  30 year old female presents to the ED with complaint of vaginal bleeding with a history of being approximately [redacted] weeks pregnant.  Patient denied any abdominal cramping at the time of her exam.  She was referred by [facility] for further evaluation of her symptoms and also has a follow-up appointment already scheduled.  Beta hCG was elevated from her initial beta done 3 days ago from 824 to 1139.  Ultrasound shows an ill-defined gestational sac with concern of a threatened miscarriage.  Patient is encouraged to keep her appointment with [facility] OB[facility] as it is recommended that she have a repeat beta and ultrasound in 10 to 14 days.  Patient is instructed to return to the  emergency department if any severe worsening of her symptoms over the holiday weekend.   ____________________________________________   FINAL CLINICAL IMPRESSION(S) / ED DIAGNOSES  Final diagnoses:  Threatened miscarriage  Vaginal bleeding in pregnancy     ED Discharge Orders     None        Note:  This document was prepared using Dragon voice recognition software and may include unintentional dictation errors.    Tommi Rumps, PA-C 01/07/21 1322    Merwyn Katos, MD 01/07/21 [phone]

## 2021-01-07 NOTE — ED Triage Notes (Signed)
Pt comes into the ED via POV c/o vaginal bleeding.  Pt currently is [redacted] weeks pregnant.  Pt states the vaginal blood is brown in color.  Pt states she has been having some "spotting" but it had dissipated until today.  Pt denies any abdominal cramping and is in NAD.

## 2021-01-07 NOTE — Telephone Encounter (Signed)
Recv'd msg to call pt; early preg; bleeding heavy; what to do?  Pt is in [facility] ED but is waiting; states bleeding is like a period but is starting to lighten up; adv she is where she needs to be.

## 2021-01-08 DIAGNOSIS — Z3A01 Less than 8 weeks gestation of pregnancy: Secondary | ICD-10-CM | POA: Diagnosis not present

## 2021-01-08 DIAGNOSIS — O200 Threatened abortion: Secondary | ICD-10-CM | POA: Diagnosis not present

## 2021-01-08 DIAGNOSIS — O209 Hemorrhage in early pregnancy, unspecified: Secondary | ICD-10-CM | POA: Diagnosis not present

## 2021-01-13 ENCOUNTER — Encounter: Payer: Self-pay | Admitting: Obstetrics and Gynecology

## 2021-01-13 ENCOUNTER — Ambulatory Visit (INDEPENDENT_AMBULATORY_CARE_PROVIDER_SITE_OTHER): Payer: Medicaid Other | Admitting: Obstetrics and Gynecology

## 2021-01-13 ENCOUNTER — Other Ambulatory Visit: Payer: Self-pay

## 2021-01-13 VITALS — BP 130/70 | Ht 63.0 in | Wt 243.6 lb

## 2021-01-13 DIAGNOSIS — O039 Complete or unspecified spontaneous abortion without complication: Secondary | ICD-10-CM

## 2021-01-13 NOTE — Progress Notes (Signed)
Patient ID: Laura Lucero, female   DOB: Jun 06, 1991, 30 y.o.   MRN: [id number]  Reason for Consult: Gynecologic Exam   Referred by Department, [facility]  Subjective:     HPI:  Laura Lucero is a 30 y.o. female.  She presents today for a visit regarding a threatened miscarriage.  She reports on January 07, 2021 she began having vaginal bleeding while at work.  The bleeding was small and brown in nature and then progressed to some bright red bleeding.  She was seen and evaluated twice at [facility].  She had an ultrasound and a beta hCG that were inconclusive for pregnancy viability.  Follow-up ultrasound in 10 to 14 days is recommended.  She reports that since that time she has continued to have vaginal bleeding and is currently having brown spotting.  This bleeding is not as heavy as miscarriages she has had in the past.  Gynecological History  Patient's last menstrual period was 11/25/2020.   OB History  Gravida Para Term Preterm AB Living  7 4 4   1 4   SAB IAB Ectopic Multiple Live Births  1     0 4    # Outcome Date GA Lbr Len/2nd Weight Sex Delivery Anes PTL Lv  7 Current           6 Term 02/21/18 [redacted]w[redacted]d  5 lb 14.5 oz (2.68 kg) F CS-LTranv Spinal  LIV  5 SAB 01/08/17 [redacted]w[redacted]d         4 Term 03/07/16 [redacted]w[redacted]d  7 lb 7.6 oz (3.39 kg) M CS-LTranv Spinal  LIV  3 Term 12/20/13 [redacted]w[redacted]d  7 lb 11 oz (3.487 kg) F CS-LTranv   LIV     Birth Comments: allergic reaction ampicillin  2 Term 06/05/11 [redacted]w[redacted]d  7 lb 15 oz (3.6 kg) F Vag-Spont   LIV  1 Gravida              Past Medical History:  Diagnosis Date   Anemia    Morbid obesity (HCC)    Family History  Problem Relation Age of Onset   Healthy Mother    Healthy Father    Past Surgical History:  Procedure Laterality Date   CESAREAN SECTION  2015   CESAREAN SECTION N/A 03/07/2016   Procedure: CESAREAN SECTION;  Surgeon: 03/09/2016, MD;  Location: [facility] ORS;  Service: Obstetrics;  Laterality: N/A;   CESAREAN SECTION N/A 02/21/2018    Procedure: CESAREAN SECTION;  Surgeon: 02/23/2018, MD;  Location: [facility] ORS;  Service: Obstetrics;  Laterality: N/A;  Time of Birth: 09:09 Sex: Female Weight:    CYST REMOVAL NECK      Short Social History:  Social History   Tobacco Use   Smoking status: Never   Smokeless tobacco: Never  Substance Use Topics   Alcohol use: No    Allergies  Allergen Reactions   Ampicillin Anaphylaxis    Has patient had a PCN reaction causing immediate rash, facial/tongue/throat swelling, SOB or lightheadedness with hypotension: Yes Has patient had a PCN reaction causing severe rash involving mucus membranes or skin necrosis: No Has patient had a PCN reaction that required hospitalization: No Has patient had a PCN reaction occurring within the last 10 years: Yes If all of the above answers are "NO", then may proceed with Cephalosporin use. Throat swelling    Current Outpatient Medications  Medication Sig Dispense Refill   Prenatal 6.75-0.2 MG TABS One qd 90 tablet 1   No current facility-administered  medications for this visit.    Review of Systems  Constitutional: Negative for chills, fatigue, fever and unexpected weight change.  HENT: Negative for trouble swallowing.  Eyes: Negative for loss of vision.  Respiratory: Negative for cough, shortness of breath and wheezing.  Cardiovascular: Negative for chest pain, leg swelling, palpitations and syncope.  GI: Negative for abdominal pain, blood in stool, diarrhea, nausea and vomiting.  GU: Negative for difficulty urinating, dysuria, frequency and hematuria.  Musculoskeletal: Negative for back pain, leg pain and joint pain.  Skin: Negative for rash.  Neurological: Negative for dizziness, headaches, light-headedness, numbness and seizures.  Psychiatric: Negative for behavioral problem, confusion, depressed mood and sleep disturbance.       Objective:  Objective   Vitals:   01/13/21 0836  BP: 130/70  Weight: 243 lb 9.6 oz  (110.5 kg)  Height: 5\' 3"  (1.6 m)   Body mass index is 43.15 kg/m.  Physical Exam Vitals and nursing note reviewed. Exam conducted with a chaperone present.  Constitutional:      Appearance: Normal appearance. She is well-developed.  HENT:     Head: Normocephalic and atraumatic.  Eyes:     Extraocular Movements: Extraocular movements intact.     Pupils: Pupils are equal, round, and reactive to light.  Cardiovascular:     Rate and Rhythm: Normal rate and regular rhythm.  Pulmonary:     Effort: Pulmonary effort is normal. No respiratory distress.     Breath sounds: Normal breath sounds.  Abdominal:     General: Abdomen is flat.     Palpations: Abdomen is soft.  Genitourinary:    Comments: External: Normal appearing vulva. No lesions noted.  Speculum examination: Normal appearing cervix.  Brown vaginal blood seen at the cervical os.  Minimal to light bleeding.   Musculoskeletal:        General: No signs of injury.  Skin:    General: Skin is warm and dry.  Neurological:     Mental Status: She is alert and oriented to person, place, and time.  Psychiatric:        Behavior: Behavior normal.        Thought Content: Thought content normal.        Judgment: Judgment normal.   Transvaginal : Transvaginal ultrasound performed bedside today in office shows an empty uterus.  Pregnancy previously seen has resolved and resulted in miscarriage.  Endometrial thickness 4 mm.  No color flow seen       Assessment/Plan:    30 year old with completed miscarriage Condolences were offered to the patient.  There appears to be no retained tissues that would require further care or follow-up.  Discussed options for initiation of birth control versus continued attempts with conceiving.  Patient would like to pursue pregnancy at this time.  More than 30 minutes were spent face to face with the patient in the room, reviewing the medical record, labs and images, and coordinating care for the  patient. The plan of management was discussed in detail and counseling was provided.    37 MD [facility], [facility], [facility] 01/13/2021 9:06 AM

## 2021-01-20 ENCOUNTER — Encounter: Payer: Medicaid Other | Admitting: Obstetrics and Gynecology

## 2021-01-23 ENCOUNTER — Other Ambulatory Visit: Payer: Self-pay

## 2021-01-23 ENCOUNTER — Ambulatory Visit
Admission: EM | Admit: 2021-01-23 | Discharge: 2021-01-23 | Disposition: A | Discharge status: Home / Self Care | Payer: Medicaid Other | Attending: Emergency Medicine | Admitting: Emergency Medicine

## 2021-01-23 DIAGNOSIS — Z79899 Other long term (current) drug therapy: Secondary | ICD-10-CM | POA: Diagnosis not present

## 2021-01-23 DIAGNOSIS — J069 Acute upper respiratory infection, unspecified: Secondary | ICD-10-CM | POA: Insufficient documentation

## 2021-01-23 DIAGNOSIS — Z793 Long term (current) use of hormonal contraceptives: Secondary | ICD-10-CM | POA: Diagnosis not present

## 2021-01-23 DIAGNOSIS — R059 Cough, unspecified: Secondary | ICD-10-CM | POA: Insufficient documentation

## 2021-01-23 DIAGNOSIS — Z20822 Contact with and (suspected) exposure to covid-19: Secondary | ICD-10-CM | POA: Diagnosis not present

## 2021-01-23 DIAGNOSIS — Z881 Allergy status to other antibiotic agents status: Secondary | ICD-10-CM | POA: Diagnosis not present

## 2021-01-23 LAB — POC SARS CORONAVIRUS 2 AG: SARSCOV2ONAVIRUS 2 AG: NEGATIVE

## 2021-01-23 MED ORDER — BENZONATATE 100 MG PO CAPS: 200.0000 mg | ORAL_CAPSULE | Freq: Three times a day (TID) | ORAL | 0 refills | Status: DC

## 2021-01-23 MED ORDER — IBUPROFEN 600 MG PO TABS
600.0000 mg | ORAL_TABLET | Freq: Once | ORAL | Status: AC
  Administered 2021-01-23: 600 mg via ORAL

## 2021-01-23 MED ORDER — PROMETHAZINE-DM 6.25-15 MG/5ML PO SYRP: 5.0000 mL | ORAL_SOLUTION | Freq: Four times a day (QID) | ORAL | 0 refills | Status: DC | PRN

## 2021-01-23 MED ORDER — IPRATROPIUM BROMIDE 0.06 % NA SOLN: 2.0000 | Freq: Four times a day (QID) | NASAL | 12 refills | Status: DC

## 2021-01-23 NOTE — ED Triage Notes (Signed)
Pt c/o severe headache, sore throat, nasal congestion, cough, and chills since last night. Pt's daughter is also sick.

## 2021-01-23 NOTE — Discharge Instructions (Addendum)
Isolate at home pending the results of your COVID test.  If you test positive then you will have to quarantine for 5 days from the start of your symptoms.  After 5 days you can break quarantine if your symptoms have improved and you have not had a fever for 24 hours without taking Tylenol or ibuprofen.  Use over-the-counter Tylenol and ibuprofen as needed for body aches and fever.  Use the Atrovent nasal spray, 2 squirts in each nostril every 6 hours, as needed for nasal congestion and postnasal drip.  Use the Tessalon Perles during the day as needed for cough and the Promethazine DM cough syrup at nighttime as will make you drowsy.  If you develop any increased shortness of breath-especially at rest, you are unable to speak in full sentences, or is a late sign your lips are turning blue you need to go the ER for evaluation.  

## 2021-01-23 NOTE — ED Provider Notes (Signed)
[facility]    CSN: [id number] Arrival date & time: 01/23/21  5397      History   Chief Complaint Chief Complaint  Patient presents with   Headache   Nasal Congestion   Cough    HPI Laura Lucero is a 30 y.o. female.   HPI  30 year old female here for evaluation of COVID-like symptoms.  Patient reports that last night she developed a headache, nasal congestion, sore throat, chills, body aches, productive cough for green sputum, and a subjective fever.  She denies any ear pain, runny nose, shortness breath or wheezing, or GI complaints.  Patient's daughter has had similar symptoms for the past 3 days.  Patient is unaware of any COVID exposure for she or her daughter.  Past Medical History:  Diagnosis Date   Anemia    Morbid obesity [facility])     Patient Active Problem List   Diagnosis Date Noted   Cervical high risk human papillomavirus (HPV) DNA test positive 07/28/2019   Morbid obesity (HCC) 05/30/2018   BMI 40.0-44.9, adult (HCC) 05/30/2018   History of cesarean delivery 03/07/2016    Past Surgical History:  Procedure Laterality Date   CESAREAN SECTION  2015   CESAREAN SECTION N/A 03/07/2016   Procedure: CESAREAN SECTION;  Surgeon: Conard Novak, MD;  Location: [facility] ORS;  Service: Obstetrics;  Laterality: N/A;   CESAREAN SECTION N/A 02/21/2018   Procedure: CESAREAN SECTION;  Surgeon: Natale Milch, MD;  Location: [facility] ORS;  Service: Obstetrics;  Laterality: N/A;  Time of Birth: 09:[birth year] Sex: Female Weight:    CYST REMOVAL NECK      OB History     Gravida  7   Para  4   Term  4   Preterm      AB  2   Living  4      SAB  2   IAB      Ectopic      Multiple  0   Live Births  4            Home Medications    Prior to Admission medications   Medication Sig Start Date End Date Taking? Authorizing Provider  benzonatate (TESSALON) 100 MG capsule Take 2 capsules (200 mg total) by mouth every 8 (eight) hours. 01/23/21   Yes Becky Augusta, NP  ipratropium (ATROVENT) 0.06 % nasal spray Place 2 sprays into both nostrils 4 (four) times daily. 01/23/21  Yes Becky Augusta, NP  promethazine-dextromethorphan (PROMETHAZINE-DM) 6.25-15 MG/5ML syrup Take 5 mLs by mouth 4 (four) times daily as needed. 01/23/21  Yes Becky Augusta, NP  Prenatal 6.75-0.2 MG TABS One qd 01/03/21   Rodriguez-Southworth, Nettie Elm, PA-C  medroxyPROGESTERone Acetate 150 MG/ML SUSY INJECT ONE ML (150MG  TOTAL) INTO THE MUSCLE EVERY 3 MONTHS. 04/02/19 04/06/20  04/08/20, MD  phentermine (ADIPEX-P) 37.5 MG tablet Take 1 tablet (37.5 mg total) by mouth daily before breakfast. 05/30/18 04/06/20  04/08/20, MD    Family History Family History  Problem Relation Age of Onset   Healthy Mother    Healthy Father     Social History Social History   Tobacco Use   Smoking status: Never   Smokeless tobacco: Never  Vaping Use   Vaping Use: Never used  Substance Use Topics   Alcohol use: No   Drug use: No     Allergies   Ampicillin   Review of Systems Review of Systems  Constitutional:  Positive for chills and fever.  Negative for activity change and appetite change.  HENT:  Positive for congestion and sore throat. Negative for ear pain and rhinorrhea.   Respiratory:  Positive for cough. Negative for shortness of breath and wheezing.   Gastrointestinal:  Negative for diarrhea, nausea and vomiting.  Musculoskeletal:  Positive for arthralgias and myalgias.  Skin:  Negative for rash.  Neurological:  Positive for headaches.  Hematological: Negative.   Psychiatric/Behavioral: Negative.      Physical Exam Triage Vital Signs ED Triage Vitals [01/23/21 0849]  Enc Vitals Group     BP      Pulse      Resp      Temp      Temp src      SpO2      Weight 240 lb (108.9 kg)     Height 5\' 3"  (1.6 m)     Head Circumference      Peak Flow      Pain Score 10     Pain Loc      Pain Edu?      Excl. in GC?    No data found.  Updated  Vital Signs BP 122/85 (BP Location: Left Arm)   Temp (!) 100.8 F (38.2 C) (Oral)   Resp 18   Ht 5\' 3"  (1.6 m)   Wt 240 lb (108.9 kg)   SpO2 100%   BMI 42.51 kg/m   Visual Acuity Right Eye Distance:   Left Eye Distance:   Bilateral Distance:    Right Eye Near:   Left Eye Near:    Bilateral Near:     Physical Exam Vitals and nursing note reviewed.  Constitutional:      General: She is not in acute distress.    Appearance: Normal appearance. She is obese. She is not ill-appearing.  HENT:     Head: Normocephalic and atraumatic.     Right Ear: Tympanic membrane, ear canal and external ear normal. There is no impacted cerumen.     Left Ear: Tympanic membrane, ear canal and external ear normal. There is no impacted cerumen.     Nose: Congestion and rhinorrhea present.     Mouth/Throat:     Mouth: Mucous membranes are moist.     Pharynx: Oropharynx is clear. Posterior oropharyngeal erythema present.  Cardiovascular:     Rate and Rhythm: Normal rate and regular rhythm.     Pulses: Normal pulses.     Heart sounds: Normal heart sounds. No murmur heard.   No gallop.  Pulmonary:     Effort: Pulmonary effort is normal.     Breath sounds: Normal breath sounds. No wheezing, rhonchi or rales.  Musculoskeletal:     Cervical back: Normal range of motion and neck supple.  Lymphadenopathy:     Cervical: No cervical adenopathy.  Skin:    General: Skin is warm and dry.     Capillary Refill: Capillary refill takes less than 2 seconds.     Findings: No erythema or rash.  Neurological:     General: No focal deficit present.     Mental Status: She is alert and oriented to person, place, and time.  Psychiatric:        Mood and Affect: Mood normal.        Behavior: Behavior normal.        Thought Content: Thought content normal.        Judgment: Judgment normal.     UC Treatments / Results  Labs (all labs  ordered are listed, but only abnormal results are displayed) Labs Reviewed   SARS CORONAVIRUS 2 (TAT 6-24 HRS)  POC SARS CORONAVIRUS 2 AG -  ED  POC SARS CORONAVIRUS 2 AG    EKG   Radiology No results found.  Procedures Procedures (including critical care time)  Medications Ordered in UC Medications  ibuprofen (ADVIL) tablet 600 mg (600 mg Oral Given 01/23/21 0911)    Initial Impression / Assessment and Plan / UC Course  I have reviewed the triage vital signs and the nursing notes.  Pertinent labs & imaging results that were available during my care of the patient were reviewed by me and considered in my medical decision making (see chart for details).  Patient is a very pleasant 30 year old female here for evaluation of COVID-like symptoms as outlined in the HPI above.  Patient's physical exam reveals pearly gray tympanic membranes bilaterally with a normal light reflex and clear external auditory canals.  Nasal mucosa is erythematous edematous with scant clear nasal discharge.  Oropharyngeal exam reveals posterior oropharyngeal erythema with clear postnasal drip.  Tonsillar pillars are unremarkable.  No cervical lymphadenopathy appreciable exam.  Cardiopulmonary exam is benign.  Patient reports that her biggest complaint right now is her headache and body aches but has not taken any medications for them.  Will medicate patient with 60 mg ibuprofen by mouth perform a rapid COVID test and if that is negative send out a PCR.  Rapid COVID was negative and PCR is pending.  Will discharge patient home with a diagnosis of viral URI with cough with Atrovent nasal spray to help with her nasal congestion, Tessalon Perles and Promethazine DM cough syrup to help with cough I informed patient that if her PCR comes back positive we will treat with molnupiravir as we do not have any recent blood work on the patient.  She meets criteria on the fact that she is obese and also has a history of anemia.   Final Clinical Impressions(s) / UC Diagnoses   Final diagnoses:  Viral  URI with cough     Discharge Instructions      Isolate at home pending the results of your COVID test.  If you test positive then you will have to quarantine for 5 days from the start of your symptoms.  After 5 days you can break quarantine if your symptoms have improved and you have not had a fever for 24 hours without taking Tylenol or ibuprofen.  Use over-the-counter Tylenol and ibuprofen as needed for body aches and fever.  Use the Atrovent nasal spray, 2 squirts in each nostril every 6 hours, as needed for nasal congestion and postnasal drip.  Use the Tessalon Perles during the day as needed for cough and the Promethazine DM cough syrup at nighttime as will make you drowsy.  If you develop any increased shortness of breath-especially at rest, you are unable to speak in full sentences, or is a late sign your lips are turning blue you need to go the ER for evaluation.      ED Prescriptions     Medication Sig Dispense Auth. Provider   benzonatate (TESSALON) 100 MG capsule Take 2 capsules (200 mg total) by mouth every 8 (eight) hours. 21 capsule Becky Augusta, NP   ipratropium (ATROVENT) 0.06 % nasal spray Place 2 sprays into both nostrils 4 (four) times daily. 15 mL Becky Augusta, NP   promethazine-dextromethorphan (PROMETHAZINE-DM) 6.25-15 MG/5ML syrup Take 5 mLs by mouth 4 (four) times daily as  needed. 118 mL Becky Augustayan, Zubayr Bednarczyk, NP      PDMP not reviewed this encounter.   Becky Augustayan, Nesreen Albano, NP 01/23/21 [id number]

## 2021-01-24 LAB — SARS CORONAVIRUS 2 (TAT 6-24 HRS): SARS Coronavirus 2: NEGATIVE

## 2021-01-27 ENCOUNTER — Encounter: Payer: Self-pay | Admitting: *Deleted

## 2021-01-27 ENCOUNTER — Other Ambulatory Visit: Payer: Self-pay

## 2021-01-27 DIAGNOSIS — Z20822 Contact with and (suspected) exposure to covid-19: Secondary | ICD-10-CM | POA: Insufficient documentation

## 2021-01-27 DIAGNOSIS — J09X2 Influenza due to identified novel influenza A virus with other respiratory manifestations: Secondary | ICD-10-CM | POA: Diagnosis not present

## 2021-01-27 DIAGNOSIS — R059 Cough, unspecified: Secondary | ICD-10-CM | POA: Diagnosis present

## 2021-01-27 NOTE — ED Triage Notes (Signed)
Pt states runny nose, fever and cough for a week.  Entire family sick with similar sx.  Pt alert

## 2021-01-28 ENCOUNTER — Emergency Department
Admission: EM | Admit: 2021-01-28 | Discharge: 2021-01-28 | Disposition: A | Discharge status: Home / Self Care | Payer: Medicaid Other | Attending: Emergency Medicine | Admitting: Emergency Medicine

## 2021-01-28 DIAGNOSIS — J101 Influenza due to other identified influenza virus with other respiratory manifestations: Secondary | ICD-10-CM

## 2021-01-28 LAB — RESP PANEL BY RT-PCR (FLU A&B, COVID) ARPGX2
Influenza A by PCR: POSITIVE — AB
Influenza B by PCR: NEGATIVE
SARS Coronavirus 2 by RT PCR: NEGATIVE

## 2021-01-28 NOTE — Discharge Instructions (Addendum)
Continue to monitor and treat any fevers.  Take over-the-counter medicine as needed for symptom relief.

## 2021-01-28 NOTE — ED Provider Notes (Signed)
[facility] Emergency Department Provider Note ____________________________________________  Time seen: 2336  I have reviewed the triage vital signs and the nursing notes.  HISTORY  Chief Complaint  Cough   HPI Laura Lucero is a 30 y.o. female Presents to the ED accompanied by her husband, and 4 children, for evaluation of symptoms including cough, congestion, and fevers.Patient reports cough, runny nose, and fevers for the last week.  She notes similar symptoms in household contacts.  She recently was evaluated at a local urgent care, started on a cough medicine.  Past Medical History:  Diagnosis Date   Anemia    Morbid obesity [facility])     Patient Active Problem List   Diagnosis Date Noted   Cervical high risk human papillomavirus (HPV) DNA test positive 07/28/2019   Morbid obesity (HCC) 05/30/2018   BMI 40.0-44.9, adult (HCC) 05/30/2018   History of cesarean delivery 03/07/2016    Past Surgical History:  Procedure Laterality Date   CESAREAN SECTION  2015   CESAREAN SECTION N/A 03/07/2016   Procedure: CESAREAN SECTION;  Surgeon: Conard Novak, MD;  Location: [facility] ORS;  Service: Obstetrics;  Laterality: N/A;   CESAREAN SECTION N/A 02/21/2018   Procedure: CESAREAN SECTION;  Surgeon: Natale Milch, MD;  Location: [facility] ORS;  Service: Obstetrics;  Laterality: N/A;  Time of Birth: 09:[birth year] Sex: Female Weight:    CYST REMOVAL NECK      Prior to Admission medications   Medication Sig Start Date End Date Taking? Authorizing Provider  benzonatate (TESSALON) 100 MG capsule Take 2 capsules (200 mg total) by mouth every 8 (eight) hours. 01/23/21   Becky Augusta, NP  ipratropium (ATROVENT) 0.06 % nasal spray Place 2 sprays into both nostrils 4 (four) times daily. 01/23/21   Becky Augusta, NP  Prenatal 6.75-0.2 MG TABS One qd 01/03/21   Rodriguez-Southworth, Nettie Elm, PA-C  promethazine-dextromethorphan (PROMETHAZINE-DM) 6.25-15 MG/5ML syrup Take 5 mLs by  mouth 4 (four) times daily as needed. 01/23/21   Becky Augusta, NP  medroxyPROGESTERone Acetate 150 MG/ML SUSY INJECT ONE ML (150MG  TOTAL) INTO THE MUSCLE EVERY 3 MONTHS. 04/02/19 04/06/20  04/08/20, MD  phentermine (ADIPEX-P) 37.5 MG tablet Take 1 tablet (37.5 mg total) by mouth daily before breakfast. 05/30/18 04/06/20  04/08/20, MD    Allergies Ampicillin  Family History  Problem Relation Age of Onset   Healthy Mother    Healthy Father     Social History Social History   Tobacco Use   Smoking status: Never   Smokeless tobacco: Never  Vaping Use   Vaping Use: Never used  Substance Use Topics   Alcohol use: No   Drug use: No    Review of Systems  Constitutional: Positive for fever. Eyes: Negative for visual changes. ENT: Negative for sore throat. Cardiovascular: Negative for chest pain. Respiratory: Negative for shortness of breath.Reports cough as above. Gastrointestinal: Negative for abdominal pain, vomiting and diarrhea. Genitourinary: Negative for dysuria. Musculoskeletal: Negative for back pain. Skin: Negative for rash. Neurological: Negative for headaches, focal weakness or numbness. ____________________________________________  PHYSICAL EXAM:  VITAL SIGNS: ED Triage Vitals  Enc Vitals Group     BP 01/27/21 2200 106/69     Pulse Rate 01/27/21 2200 74     Resp 01/27/21 2200 20     Temp 01/27/21 2200 98.4 F (36.9 C)     Temp Source 01/27/21 2200 Oral     SpO2 01/27/21 2200 96 %     Weight 01/27/21 2201 230  lb (104.3 kg)     Height 01/27/21 2201 5\' 3"  (1.6 m)     Head Circumference --      Peak Flow --      Pain Score --      Pain Loc --      Pain Edu? --      Excl. in GC? --     Constitutional: Alert and oriented. Well appearing and in no distress. Head: Normocephalic and atraumatic. Eyes: Conjunctivae are normal. PERRL. Normal extraocular movements Ears: Canals clear. TMs intact bilaterally. Nose: No  congestion/rhinorrhea/epistaxis. Mouth/Throat: Mucous membranes are moist. Neck: Supple. No thyromegaly. Hematological/Lymphatic/Immunological: No cervical lymphadenopathy. Cardiovascular: Normal rate, regular rhythm. Normal distal pulses. Respiratory: Normal respiratory effort. No wheezes/rales/rhonchi. Gastrointestinal: Soft and nontender. No distention. Musculoskeletal: Nontender with normal range of motion in all extremities.  Neurologic:  Normal gait without ataxia. Normal speech and language. No gross focal neurologic deficits are appreciated. Skin:  Skin is warm, dry and intact. No rash noted. Psychiatric: Mood and affect are normal. Patient exhibits appropriate insight and judgment. ____________________________________________   LABS (pertinent positives/negatives)  ___ Labs Reviewed  RESP PANEL BY RT-PCR (FLU A&B, COVID) ARPGX2 - Abnormal; Notable for the following components:      Result Value   Influenza A by PCR POSITIVE (*)    All other components within normal limits   ____________________________________________   RADIOLOGY Official radiology report(s): No results found. ____________________________________________  PROCEDURES  Procedures ____________________________________________   INITIAL IMPRESSION / ASSESSMENT AND PLAN / ED COURSE  As part of my medical decision making, I reviewed the following data within the electronic MEDICAL RECORD NUMBER Labs reviewed as above and Notes from prior ED visits    DDX: Covid, influenza, URI  Patient ED evaluation of intermittent fevers, cough, bodyaches, concerning for viral etiology.  I would in the ED for her symptoms, and found to have a viral panel screen confirming influenza A.  Patient will discharge with instruction to treat symptoms with OTC meds, and follow-up with primary provider as needed.    Laura Lucero was evaluated in Emergency Department on 01/28/2021 for the symptoms described in the history of present  illness. She was evaluated in the context of the global COVID-19 pandemic, which necessitated consideration that the patient might be at risk for infection with the SARS-CoV-2 virus that causes COVID-19. Institutional protocols and algorithms that pertain to the evaluation of patients at risk for COVID-19 are in a state of rapid change based on information released by regulatory bodies including the CDC and federal and state organizations. These policies and algorithms were followed during the patient's care in the ED. ____________________________________________  FINAL CLINICAL IMPRESSION(S) / ED DIAGNOSES  Final diagnoses:  Influenza A      01/30/2021, Karmen Stabs, PA-C 01/28/21 0054    01/30/21, MD 01/28/21 1106

## 2021-11-04 ENCOUNTER — Ambulatory Visit
Admission: RE | Admit: 2021-11-04 | Discharge: 2021-11-04 | Disposition: A | Discharge status: Home / Self Care | Payer: Medicaid Other | Source: Ambulatory Visit | Attending: Licensed Practical Nurse | Admitting: Licensed Practical Nurse

## 2021-11-04 ENCOUNTER — Ambulatory Visit (INDEPENDENT_AMBULATORY_CARE_PROVIDER_SITE_OTHER): Payer: Medicaid Other | Admitting: Licensed Practical Nurse

## 2021-11-04 VITALS — BP 120/62 | Ht 63.0 in | Wt 217.0 lb

## 2021-11-04 DIAGNOSIS — N926 Irregular menstruation, unspecified: Secondary | ICD-10-CM

## 2021-11-04 DIAGNOSIS — O209 Hemorrhage in early pregnancy, unspecified: Secondary | ICD-10-CM | POA: Diagnosis present

## 2021-11-04 LAB — POCT URINE PREGNANCY: Preg Test, Ur: POSITIVE — AB

## 2021-11-04 NOTE — Progress Notes (Signed)
? ?Patient ID: Laura Lucero, female   DOB: Aug 23, 1990, 31 y.o.   MRN: [id number] ? ?Reason for Consult: bleeding in early pregnancy   ?Referred by Department, [facility] ? ?Subjective:  ?   ?HPI: :LMP 09/25/2021, she had a positive UPT.  Yesterday she had bleeding, it was red but not too heavy, then it stopped and then she started to bleed again this morning. Denies any cramping or passing of clots.  ?This was a desired pregnancy. She was not using any form of contraception. She miscarried last year at about 5 weeks,  ? ?Laura Lucero is a 31 y.o. female [phone] with early pregnancy bleeding  ? ?Gynecological History ? ?LMP September 25, 2021 ? ?Obstetrical History ?[id number]  ? ?Past Medical History:  ?Diagnosis Date  ? Anemia   ? Morbid obesity (HCC)   ? ?Family History  ?Problem Relation Age of Onset  ? Laura Lucero   ? Laura Lucero   ? ?Past Surgical History:  ?Procedure Laterality Date  ? CESAREAN SECTION  2015  ? CESAREAN SECTION N/A 03/07/2016  ? Procedure: CESAREAN SECTION;  Surgeon: Conard Novak, MD;  Location: [facility] ORS;  Service: Obstetrics;  Laterality: N/A;  ? CESAREAN SECTION N/A 02/21/2018  ? Procedure: CESAREAN SECTION;  Surgeon: Natale Milch, MD;  Location: [facility] ORS;  Service: Obstetrics;  Laterality: N/A;  Time of Birth: 09:[birth year] ?Sex: Female ?Weight: ?  ? CYST REMOVAL NECK    ? ? ?Short Social History:  ?Social History  ? ?Tobacco Use  ? Smoking status: Never  ? Smokeless tobacco: Never  ?Substance Use Topics  ? Alcohol use: No  ? ? ?Allergies  ?Allergen Reactions  ? Ampicillin Anaphylaxis  ?  Has patient had a PCN reaction causing immediate rash, facial/tongue/throat swelling, SOB or lightheadedness with hypotension: Yes ?Has patient had a PCN reaction causing severe rash involving mucus membranes or skin necrosis: No ?Has patient had a PCN reaction that required hospitalization: No ?Has patient had a PCN reaction occurring within the last 10 years: Yes ?If all of the above answers are  "NO", then may proceed with Cephalosporin use. ?Throat swelling  ? ? ?Current Outpatient Medications  ?Medication Sig Dispense Refill  ? Prenatal 6.75-0.2 MG TABS One qd 90 tablet 1  ? benzonatate (TESSALON) 100 MG capsule Take 2 capsules (200 mg total) by mouth every 8 (eight) hours. (Patient not taking: Reported on 11/04/2021) 21 capsule 0  ? ipratropium (ATROVENT) 0.06 % nasal spray Place 2 sprays into both nostrils 4 (four) times daily. (Patient not taking: Reported on 11/04/2021) 15 mL 12  ? promethazine-dextromethorphan (PROMETHAZINE-DM) 6.25-15 MG/5ML syrup Take 5 mLs by mouth 4 (four) times daily as needed. (Patient not taking: Reported on 11/04/2021) 118 mL 0  ? ?No current facility-administered medications for this visit.  ? ? ?REVIEW OF SYSTEMS  ? ?   ?Objective:  ?Objective  ? ?Vitals:  ? 11/04/21 1036  ?BP: 120/62  ?Weight: 217 lb (98.4 kg)  ?Height: 5\' 3"  (1.6 m)  ? ?Body mass index is 38.44 kg/m?. ? ?Physical Exam ?Constitutional:   ?   Appearance: Normal appearance.  ?Genitourinary: ?   General: Normal vulva.  ?   Comments: SSE: cervix slightly open, small to moderate menses like discharge present  ?Neurological:  ?   Mental Status: She is alert.  ?Psychiatric:     ?   Mood and Affect: Mood normal.  ?   Comments: Appropriately tearful  ?  ?Blood type A positive ?Pregnancy  test positive  ? ?Assessment/Plan:  ?  ?Bleeding in early pregnancy  ? ?-[place] scheduled for later today ?-will serial beta's ?-will call pt after [place] ? ?Carie Caddy, CNM  ?Domingo Pulse, [facility]  ?11/04/21  ?11:30 AM  ? ? ?  ? ?  ?

## 2021-11-05 LAB — BETA HCG QUANT (REF LAB): hCG Quant: 51 m[IU]/mL

## 2021-11-07 ENCOUNTER — Other Ambulatory Visit: Payer: Self-pay | Admitting: Licensed Practical Nurse

## 2021-11-07 DIAGNOSIS — O039 Complete or unspecified spontaneous abortion without complication: Secondary | ICD-10-CM

## 2021-11-07 NOTE — Progress Notes (Signed)
TC to [facility], ?Laura Lucero reports that yesterday she had increased bleeding with clots, today the bleeding has slowed down. Reviewed based on the [place], her hormone level, and bleeding this most likely a complete miscarriage. Reviewed monitoring hormone levels to be reassured that the miscarriage is complete.  ?Emotionally Laura Lucero is doing well, "taking it one day at time" ?Will call in the am to arrange lab only apt and then a 2 week fu apt to discuss future pregnancies.  ?Carie Caddy, CNM  ?Domingo Pulse, [facility]  ?11/07/21  ?11:45 AM  ? ?

## 2021-11-17 ENCOUNTER — Ambulatory Visit (INDEPENDENT_AMBULATORY_CARE_PROVIDER_SITE_OTHER): Payer: Medicaid Other

## 2021-11-17 ENCOUNTER — Ambulatory Visit
Admission: EM | Admit: 2021-11-17 | Discharge: 2021-11-17 | Disposition: A | Discharge status: Home / Self Care | Payer: Medicaid Other | Attending: Emergency Medicine | Admitting: Emergency Medicine

## 2021-11-17 DIAGNOSIS — M25532 Pain in left wrist: Secondary | ICD-10-CM

## 2021-11-17 DIAGNOSIS — W19XXXA Unspecified fall, initial encounter: Secondary | ICD-10-CM | POA: Diagnosis not present

## 2021-11-17 MED ORDER — IBUPROFEN 600 MG PO TABS: 600.0000 mg | ORAL_TABLET | Freq: Four times a day (QID) | ORAL | 0 refills | Status: DC | PRN

## 2021-11-17 NOTE — ED Triage Notes (Signed)
Patient presents to Urgent Care with complaints of left arm injury from a trip and fall that happened today. She states with fall she landed on her back and arm. Pain increases with movement. Not treating pain.  ?

## 2021-11-17 NOTE — Discharge Instructions (Addendum)
Take the ibuprofen as prescribed.  Rest and elevate your wrist.  Apply ice packs 2-3 times a day for up to 20 minutes each.  Wear the wrist splint as needed for comfort.   ? ?Follow up with an orthopedist.   ? ?

## 2021-11-17 NOTE — ED Provider Notes (Signed)
?[facility] ? ? ? ?CSN: [id number] ?Arrival date & time: 11/17/21  1211 ? ? ?  ? ?History   ?Chief Complaint ?Chief Complaint  ?Patient presents with  ? Arm Injury  ?  Left arm   ? ? ?HPI ?Laura Lucero is a 31 y.o. female.  Patient presents with pain in her left wrist after she tripped and fell today.  The fall occurred at work but patient states it is not a workman's comp injury and is unrelated to her work duties.  No numbness, weakness, paresthesias, open wounds, redness, bruising, or other symptoms.  No head injury or loss of consciousness.  No other injuries.  No treatments at home.  Her medical history includes morbid obesity and anemia. ? ?The history is provided by the patient and medical records.  ? ?Past Medical History:  ?Diagnosis Date  ? Anemia   ? Morbid obesity (HCC)   ? ? ?Patient Active Problem List  ? Diagnosis Date Noted  ? Cervical high risk human papillomavirus (HPV) DNA test positive 07/28/2019  ? Morbid obesity (HCC) 05/30/2018  ? BMI 40.0-44.9, adult (HCC) 05/30/2018  ? History of cesarean delivery 03/07/2016  ? ? ?Past Surgical History:  ?Procedure Laterality Date  ? CESAREAN SECTION  2015  ? CESAREAN SECTION N/A 03/07/2016  ? Procedure: CESAREAN SECTION;  Surgeon: Conard Novak, MD;  Location: [facility] ORS;  Service: Obstetrics;  Laterality: N/A;  ? CESAREAN SECTION N/A 02/21/2018  ? Procedure: CESAREAN SECTION;  Surgeon: Natale Milch, MD;  Location: [facility] OR[facility];  Service: Obstetrics;  Laterality: N/A;  Time of Birth: [birth year] ?Sex: Female ?Weight: ?  ? CYST REMOVAL NECK    ? ? ?OB History   ? ? Gravida  ?7  ? Para  ?4  ? Term  ?4  ? Preterm  ?   ? AB  ?2  ? Living  ?4  ?  ? ? SAB  ?2  ? IAB  ?   ? Ectopic  ?   ? Multiple  ?0  ? Live Births  ?4  ?   ?  ?  ? ? ? ?Home Medications   ? ?Prior to Admission medications   ?Medication Sig Start Date End Date Taking? Authorizing Provider  ?ibuprofen (ADVIL) 600 MG tablet Take 1 tablet (600 mg total) by mouth every 6 (six) hours as  needed. 11/17/21  Yes Mickie Bail, NP  ?benzonatate (TESSALON) 100 MG capsule Take 2 capsules (200 mg total) by mouth every 8 (eight) hours. ?Patient not taking: Reported on 11/04/2021 01/23/21   Becky Augusta, NP  ?ipratropium (ATROVENT) 0.06 % nasal spray Place 2 sprays into both nostrils 4 (four) times daily. ?Patient not taking: Reported on 11/04/2021 01/23/21   Becky Augusta, NP  ?Prenatal 6.75-0.2 MG TABS One qd 01/03/21   Rodriguez-Southworth, Nettie Elm, PA-C  ?promethazine-dextromethorphan (PROMETHAZINE-DM) 6.25-15 MG/5ML syrup Take 5 mLs by mouth 4 (four) times daily as needed. ?Patient not taking: Reported on 11/04/2021 01/23/21   Becky Augusta, NP  ?medroxyPROGESTERone Acetate 150 MG/ML SUSY INJECT ONE ML (150MG  TOTAL) INTO THE MUSCLE EVERY 3 MONTHS. 04/02/19 04/06/20  04/08/20, MD  ?phentermine (ADIPEX-P) 37.5 MG tablet Take 1 tablet (37.5 mg total) by mouth daily before breakfast. 05/30/18 04/06/20  04/08/20, MD  ? ? ?Family History ?Family History  ?Problem Relation Age of Onset  ? Healthy Mother   ? Healthy Father   ? ? ?Social History ?Social History  ? ?Tobacco Use  ?  Smoking status: Never  ? Smokeless tobacco: Never  ?Vaping Use  ? Vaping Use: Never used  ?Substance Use Topics  ? Alcohol use: No  ? Drug use: No  ? ? ? ?Allergies   ?Ampicillin ? ? ?Review of Systems ?Review of Systems  ?Musculoskeletal:  Positive for arthralgias. Negative for joint swelling.  ?Skin:  Negative for color change, rash and wound.  ?Neurological:  Negative for weakness and numbness.  ?All other systems reviewed and are negative. ? ? ?Physical Exam ?Triage Vital Signs ?ED Triage Vitals  ?Enc Vitals Group  ?   BP   ?   Pulse   ?   Resp   ?   Temp   ?   Temp src   ?   SpO2   ?   Weight   ?   Height   ?   Head Circumference   ?   Peak Flow   ?   Pain Score   ?   Pain Loc   ?   Pain Edu?   ?   Excl. in [place]?   ? ?No data found. ? ?Updated Vital Signs ?BP 107/71   Pulse 83   Temp 98.2 ?F (36.8 ?C)   Resp 18   LMP  09/25/2021   SpO2 98%  ? ?Visual Acuity ?Right Eye Distance:   ?Left Eye Distance:   ?Bilateral Distance:   ? ?Right Eye Near:   ?Left Eye Near:    ?Bilateral Near:    ? ?Physical Exam ?Vitals and nursing note reviewed.  ?Constitutional:   ?   General: She is not in acute distress. ?   Appearance: She is well-developed. She is obese. She is not ill-appearing.  ?HENT:  ?   Mouth/Throat:  ?   Mouth: Mucous membranes are moist.  ?Eyes:  ?   Conjunctiva/sclera: Conjunctivae normal.  ?Cardiovascular:  ?   Rate and Rhythm: Normal rate and regular rhythm.  ?   Heart sounds: Normal heart sounds.  ?Pulmonary:  ?   Effort: Pulmonary effort is normal. No respiratory distress.  ?   Breath sounds: Normal breath sounds.  ?Musculoskeletal:     ?   General: Tenderness present. No swelling or deformity. Normal range of motion.  ?     Hands: ? ?   Cervical back: Neck supple.  ?Skin: ?   General: Skin is warm and dry.  ?   Capillary Refill: Capillary refill takes less than 2 seconds.  ?   Findings: No bruising, erythema, lesion or rash.  ?Neurological:  ?   General: No focal deficit present.  ?   Mental Status: She is alert and oriented to person, place, and time.  ?   Sensory: No sensory deficit.  ?   Motor: No weakness.  ?   Gait: Gait normal.  ?Psychiatric:     ?   Mood and Affect: Mood normal.     ?   Behavior: Behavior normal.  ? ? ? ?UC Treatments / Results  ?Labs ?(all labs ordered are listed, but only abnormal results are displayed) ?Labs Reviewed - No data to display ? ?EKG ? ? ?Radiology ?DG Wrist Complete Left ? ?Result Date: 11/17/2021 ?CLINICAL DATA:  Left arm injury after fall. EXAM: LEFT WRIST - COMPLETE 3+ VIEW COMPARISON:  None Available. FINDINGS: There is no evidence of fracture or dislocation. There is no evidence of arthropathy or other focal bone abnormality. Soft tissues are unremarkable. IMPRESSION: Negative. Electronically Signed   By:  Richarda OverlieAdam  Henn M.D.   On: 11/17/2021 12:47   ? ?Procedures ?Procedures  (including critical care time) ? ?Medications Ordered in UC ?Medications - No data to display ? ?Initial Impression / Assessment and Plan / UC Course  ?I have reviewed the triage vital signs and the nursing notes. ? ?Pertinent labs & imaging results that were available during my care of the patient were reviewed by me and considered in my medical decision making (see chart for details). ? ? Left wrist pain s/p Fall.  X-ray negative.  Treating with ibuprofen, rest, elevation, ice packs, wrist splint.  Instructed patient to follow-up with an orthopedist.  Education provided on wrist pain.  Patient agrees to plan of care. ? ? ?Final Clinical Impressions(s) / UC Diagnoses  ? ?Final diagnoses:  ?Left wrist pain  ?Fall, initial encounter  ? ? ? ?Discharge Instructions   ? ?  ?Take the ibuprofen as prescribed.  Rest and elevate your wrist.  Apply ice packs 2-3 times a day for up to 20 minutes each.  Wear the wrist splint as needed for comfort.   ? ?Follow up with an orthopedist.   ? ? ? ? ? ?ED Prescriptions   ? ? Medication Sig Dispense Auth. Provider  ? ibuprofen (ADVIL) 600 MG tablet Take 1 tablet (600 mg total) by mouth every 6 (six) hours as needed. 30 tablet Mickie Bailate, Torrance Stockley H, NP  ? ?  ? ?PDMP not reviewed this encounter. ?  ?Mickie Bailate, Jaskarn Schweer H, NP ?11/17/21 1256 ? ?

## 2021-12-07 ENCOUNTER — Other Ambulatory Visit: Payer: Medicaid Other

## 2021-12-07 DIAGNOSIS — O039 Complete or unspecified spontaneous abortion without complication: Secondary | ICD-10-CM

## 2021-12-08 ENCOUNTER — Other Ambulatory Visit: Payer: Self-pay | Admitting: Licensed Practical Nurse

## 2021-12-08 DIAGNOSIS — O200 Threatened abortion: Secondary | ICD-10-CM

## 2021-12-08 DIAGNOSIS — O3680X0 Pregnancy with inconclusive fetal viability, not applicable or unspecified: Secondary | ICD-10-CM

## 2021-12-08 LAB — BETA HCG QUANT (REF LAB): hCG Quant: 166 m[IU]/mL

## 2021-12-08 NOTE — Progress Notes (Signed)
Pt seen 4/41for a SAB, Pt called yesterday reporting have a positive UPT, desired labs. Beta 166 (beta on 4/27 51).  TC to [place]. Carma reports having N/V and breast tenderness. She missed her scheduled repeat beta and follow up  d/t work.  She has had IC since her SAB.  This is most likely a new pregnancy.  We should repeat your beta tomorrow and have an [place] at some point. Laris in agreement.  Front desk to call pt and schedule lab only apt This CNM to call Iyanla on Friday with lab results  Roberto Scales, [place], [facility]  12/08/21  8:29 AM

## 2021-12-09 ENCOUNTER — Other Ambulatory Visit: Payer: Self-pay

## 2021-12-09 ENCOUNTER — Other Ambulatory Visit: Payer: Medicaid Other

## 2021-12-09 DIAGNOSIS — O200 Threatened abortion: Secondary | ICD-10-CM

## 2021-12-10 ENCOUNTER — Other Ambulatory Visit: Payer: Self-pay | Admitting: Licensed Practical Nurse

## 2021-12-10 ENCOUNTER — Encounter: Payer: Self-pay | Admitting: Licensed Practical Nurse

## 2021-12-10 DIAGNOSIS — O200 Threatened abortion: Secondary | ICD-10-CM

## 2021-12-10 LAB — BETA HCG QUANT (REF LAB): hCG Quant: 137 m[IU]/mL

## 2021-12-10 NOTE — Telephone Encounter (Signed)
Pt is calling back stating she cannot reach centralized scheduling to schedule U/S. Can one of you pls help pt?

## 2021-12-10 NOTE — Progress Notes (Signed)
Attempted to call pt but number not in service.  My chart message sent regarding need for repeat beta to be done tomorrow at [facility] and to have an US done.  Carie Caddy, CNM  [facility], [facility]  12/10/21  10:16 AM

## 2021-12-10 NOTE — Telephone Encounter (Signed)
Patient aware to have labs drawn and to call and get her ultrasound scheduled.

## 2021-12-13 ENCOUNTER — Other Ambulatory Visit: Payer: Medicaid Other

## 2021-12-13 DIAGNOSIS — O200 Threatened abortion: Secondary | ICD-10-CM

## 2021-12-14 ENCOUNTER — Telehealth: Payer: Self-pay | Admitting: Licensed Practical Nurse

## 2021-12-14 ENCOUNTER — Encounter: Payer: Self-pay | Admitting: Licensed Practical Nurse

## 2021-12-14 DIAGNOSIS — O200 Threatened abortion: Secondary | ICD-10-CM

## 2021-12-14 LAB — BETA HCG QUANT (REF LAB): hCG Quant: 75 m[IU]/mL

## 2021-12-14 NOTE — Telephone Encounter (Signed)
TC to [facility] reports she has had heavy bleeding since yesterday.  She has changed an overnight pad 5 times already today.  She had to leave work d/t the cramping.  She is using Motrin with some relief. Reports feeling blood come out every time she stands up. Denis any light headedness or shortness of breath.  Reviewed recent labs, this is another miscarriage. You do not need to have the [place] that is scheduled.   Yurani wonders why she continues to have miscarriages.  She has had 4 successful pregnancies.  At this time I do not have an answer, but we can do some labs and send  you to a specialist.  Will return in 1 week for repeat beta and then in 2 weeks for a in person visit and labs.  Carie Caddy, CNM  [facility], [facility]  @TODAY @  1:14 PM

## 2021-12-16 ENCOUNTER — Ambulatory Visit: Admission: RE | Admit: 2021-12-16 | Payer: Medicaid Other | Source: Ambulatory Visit

## 2021-12-21 ENCOUNTER — Other Ambulatory Visit: Payer: Medicaid Other

## 2021-12-21 NOTE — Addendum Note (Signed)
Addended by: Meryl Dare on: 12/21/2021 01:55 PM   Modules accepted: Orders

## 2021-12-24 ENCOUNTER — Ambulatory Visit: Payer: Medicaid Other | Admitting: Licensed Practical Nurse

## 2021-12-30 ENCOUNTER — Encounter: Payer: Self-pay | Admitting: Licensed Practical Nurse

## 2021-12-30 ENCOUNTER — Other Ambulatory Visit: Payer: Self-pay | Admitting: Licensed Practical Nurse

## 2021-12-30 DIAGNOSIS — O039 Complete or unspecified spontaneous abortion without complication: Secondary | ICD-10-CM

## 2022-03-08 ENCOUNTER — Other Ambulatory Visit: Payer: Medicaid Other

## 2022-03-16 ENCOUNTER — Ambulatory Visit (LOCAL_COMMUNITY_HEALTH_CENTER): Payer: Medicaid Other | Admitting: Nurse Practitioner

## 2022-03-16 VITALS — Wt 209.6 lb

## 2022-03-16 DIAGNOSIS — Z3201 Encounter for pregnancy test, result positive: Secondary | ICD-10-CM

## 2022-03-16 DIAGNOSIS — Z3401 Encounter for supervision of normal first pregnancy, first trimester: Secondary | ICD-10-CM

## 2022-03-16 DIAGNOSIS — Z309 Encounter for contraceptive management, unspecified: Secondary | ICD-10-CM

## 2022-03-16 LAB — PREGNANCY, URINE: Preg Test, Ur: POSITIVE — AB

## 2022-03-16 NOTE — Progress Notes (Addendum)
[facility] problem [facility]  Subjective:  Laura Lucero is a 31 y.o. being seen today for a physical and potential birth control.  Patient states she took a pregnancy test at home and it was positive.  Interested in confirming pregnancy as well.    Chief Complaint  Patient presents with   Follow-up    PE and STD screening.     HPI   Does the patient have a current or past history of drug use? No   No components found for: "HCV"]   Health Maintenance Due  Topic Date Due   COVID-19 Vaccine (1) Never done   Hepatitis C Screening  Never done   INFLUENZA VACCINE  Never done    Review of Systems  Constitutional:  Negative for chills, fever, malaise/fatigue and weight loss.  HENT:  Negative for congestion, hearing loss and sore throat.   Eyes:  Negative for blurred vision, double vision and photophobia.  Respiratory:  Negative for shortness of breath.   Cardiovascular:  Negative for chest pain.  Gastrointestinal:  Negative for abdominal pain, blood in stool, constipation, diarrhea, heartburn, nausea and vomiting.  Genitourinary:  Negative for dysuria and frequency.       Foul odor urine smell   Musculoskeletal:  Negative for back pain, joint pain and neck pain.  Skin:  Negative for itching and rash.  Neurological:  Negative for dizziness, weakness and headaches.  Endo/Heme/Allergies:  Does not bruise/bleed easily.  Psychiatric/Behavioral:  Negative for depression, substance abuse and suicidal ideas.     The following portions of the patient's history were reviewed and updated as appropriate: allergies, current medications, past family history, past medical history, past social history, past surgical history and problem list. Problem list updated.   See flowsheet for other program required questions.  Objective:   Vitals:   03/16/22 1038  Weight: 209 lb 9.6 oz (95.1 kg)    Physical Exam Constitutional:      Appearance:  Normal appearance.  HENT:     Head: Normocephalic. No abrasion, contusion, masses or laceration. Hair is normal.     Jaw: No tenderness or swelling.     Right Ear: External ear normal.     Left Ear: External ear normal.     Nose: Nose normal.  Pulmonary:     Effort: Pulmonary effort is normal.  Musculoskeletal:     Cervical back: Full passive range of motion without pain and normal range of motion.  Skin:    General: Skin is warm and dry.     Findings: No erythema, laceration, lesion or rash.  Neurological:     Mental Status: She is alert and oriented to person, place, and time.  Psychiatric:        Attention and Perception: Attention normal.        Mood and Affect: Mood normal.        Speech: Speech normal.        Behavior: Behavior normal. Behavior is cooperative.       Assessment and Plan:  Laura Lucero is a 31 y.o. female presenting to the [facility] for a Women's Health problem visit  1. Encounter for supervision of normal first pregnancy in first trimester -32 year old female in clinic for a physical and potential birth control.  PT confirmed today.  See RN note.  Advised to schedule an OB appointment.  Patient complains of foul odor urine smell first thing in the morning, concerned  about UTI.  Informed unable to do testing for a UTI.  Referred to an urgent care or PCP, also recommended drinking lots of water and drinking cranberry juice.   Offered additional STD screening, patient declined.    - Pregnancy, urine  Total time spent: 20 minutes   Return if symptoms worsen or fail to improve.  Future Appointments  Date Time Provider Department Center  04/27/2022  2:20 PM [facility] PROVIDER [facility] None    Glenna Fellows, FNP

## 2022-03-17 MED ORDER — PRENATAL VITAMIN 27-0.8 MG PO TABS: 1.0000 | ORAL_TABLET | ORAL | 0 refills | Status: AC

## 2022-03-17 NOTE — Addendum Note (Signed)
Addended by: Berdie Ogren on: 03/17/2022 08:27 AM   Modules accepted: Orders

## 2022-03-20 ENCOUNTER — Emergency Department: Payer: Medicaid Other

## 2022-03-20 ENCOUNTER — Other Ambulatory Visit: Payer: Self-pay

## 2022-03-20 ENCOUNTER — Ambulatory Visit
Admission: EM | Admit: 2022-03-20 | Discharge: 2022-03-20 | Disposition: A | Discharge status: Home / Self Care | Payer: Medicaid Other | Attending: Family Medicine | Admitting: Family Medicine

## 2022-03-20 ENCOUNTER — Emergency Department
Admission: EM | Admit: 2022-03-20 | Discharge: 2022-03-20 | Disposition: A | Discharge status: Home / Self Care | Payer: Medicaid Other | Attending: Emergency Medicine | Admitting: Emergency Medicine

## 2022-03-20 ENCOUNTER — Encounter: Payer: Self-pay | Admitting: Emergency Medicine

## 2022-03-20 DIAGNOSIS — M79605 Pain in left leg: Secondary | ICD-10-CM | POA: Insufficient documentation

## 2022-03-20 DIAGNOSIS — M79662 Pain in left lower leg: Secondary | ICD-10-CM

## 2022-03-20 NOTE — ED Provider Notes (Signed)
[facility]    CSN: [id number] Arrival date & time: 03/20/22  1340      History   Chief Complaint Chief Complaint  Patient presents with   Leg Pain    LT calf pain     HPI Laura Lucero is a 31 y.o. female.   HPI  Laura Lucero presents for left calf pain for the past 3 days. "It feels like someone is squeezing my leg like I don't have any circulation in it." She is [redacted] weeks pregnant. She denies increased exerciss, current or previous injuries.  She has never had anything like this before.  Endorses some chest discomfort.  Denies any shortness of breath or palpitations.  No history of blood clots in her legs or lungs.  Has not traveled recently or been in any long car rides.   Past Medical History:  Diagnosis Date   Anemia    Morbid obesity (HCC)    Nausea     Patient Active Problem List   Diagnosis Date Noted   Cervical high risk human papillomavirus (HPV) DNA test positive 07/28/2019   Morbid obesity (HCC) 05/30/2018   BMI 40.0-44.9, adult (HCC) 05/30/2018   History of cesarean delivery 03/07/2016    Past Surgical History:  Procedure Laterality Date   CESAREAN SECTION  2015   CESAREAN SECTION N/A 03/07/2016   Procedure: CESAREAN SECTION;  Surgeon: Conard Novak, MD;  Location: [facility] ORS;  Service: Obstetrics;  Laterality: N/A;   CESAREAN SECTION N/A 02/21/2018   Procedure: CESAREAN SECTION;  Surgeon: Natale Milch, MD;  Location: [facility] ORS;  Service: Obstetrics;  Laterality: N/A;  Time of Birth: 09:09 Sex: Female Weight:    CYST REMOVAL NECK      OB History     Gravida  8   Para  4   Term  4   Preterm      AB  3   Living  4      SAB  3   IAB      Ectopic      Multiple  0   Live Births  4            Home Medications    Prior to Admission medications   Medication Sig Start Date End Date Taking? Authorizing Provider  Prenatal 6.75-0.2 MG TABS One qd 01/03/21  Yes Rodriguez-Southworth, Nettie Elm, PA-C  benzonatate  (TESSALON) 100 MG capsule Take 2 capsules (200 mg total) by mouth every 8 (eight) hours. 01/23/21   Becky Augusta, NP  ibuprofen (ADVIL) 600 MG tablet Take 1 tablet (600 mg total) by mouth every 6 (six) hours as needed. 11/17/21   Mickie Bail, NP  ipratropium (ATROVENT) 0.06 % nasal spray Place 2 sprays into both nostrils 4 (four) times daily. 01/23/21   Becky Augusta, NP  promethazine-dextromethorphan (PROMETHAZINE-DM) 6.25-15 MG/5ML syrup Take 5 mLs by mouth 4 (four) times daily as needed. 01/23/21   Becky Augusta, NP  medroxyPROGESTERone Acetate 150 MG/ML SUSY INJECT ONE ML (150MG  TOTAL) INTO THE MUSCLE EVERY 3 MONTHS. 04/02/19 04/06/20  04/08/20, MD  phentermine (ADIPEX-P) 37.5 MG tablet Take 1 tablet (37.5 mg total) by mouth daily before breakfast. 05/30/18 04/06/20  04/08/20, MD    Family History Family History  Problem Relation Age of Onset   Healthy Father    Healthy Mother     Social History Social History   Tobacco Use   Smoking status: Never   Smokeless tobacco: Never  Vaping Use  Vaping Use: Every day   Substances: Nicotine, Flavoring  Substance Use Topics   Alcohol use: Not Currently    Alcohol/week: 1.0 standard drink of alcohol    Types: 1 Shots of liquor per week    Comment: Reports going out drinking 2x a month   Drug use: Yes    Types: Marijuana    Comment: once a month     Allergies   Ampicillin   Review of Systems Review of Systems : negative unless otherwise stated in HPI.      Physical Exam Triage Vital Signs ED Triage Vitals  Enc Vitals Group     BP 03/20/22 1401 (!) 144/77     Pulse Rate 03/20/22 1401 71     Resp --      Temp 03/20/22 1401 98.9 F (37.2 C)     Temp Source 03/20/22 1401 Oral     SpO2 03/20/22 1401 98 %     Weight 03/20/22 1400 210 lb (95.3 kg)     Height 03/20/22 1400 5\' 3"  (1.6 m)     Head Circumference --      Peak Flow --      Pain Score 03/20/22 1400 8     Pain Loc --      Pain Edu? --      Excl.  in [place]? --    No data found.  Updated Vital Signs BP (!) 144/77 (BP Location: Left Arm)   Pulse 71   Temp 98.9 F (37.2 C) (Oral)   Ht 5\' 3"  (1.6 m)   Wt 95.3 kg   LMP 02/08/2022 (Approximate)   SpO2 98%   BMI 37.20 kg/m   Visual Acuity Right Eye Distance:   Left Eye Distance:   Bilateral Distance:    Right Eye Near:   Left Eye Near:    Bilateral Near:     Physical Exam  GEN: alert, well appearing female, in no acute distress    EYES:   pupils equal and reactive, EOM intact NECK:  supple, normal ROM RESP:  clear to auscultation bilaterally, no increased work of breathing  CVS:   regular rate and rhythm, no murmur, distal pulses intact   EXT:   normal ROM, atraumatic, mild edema compared to right calf, tenderness with squeezing calf (+Homans) NEURO:  normal without focal findings,  speech normal, alert and oriented   Skin:   warm and dry, no rash, normal skin turgor    UC Treatments / Results  Labs (all labs ordered are listed, but only abnormal results are displayed) Labs Reviewed - No data to display  EKG   Radiology No results found.  Procedures Procedures (including critical care time)  Medications Ordered in UC Medications - No data to display  Initial Impression / Assessment and Plan / UC Course  I have reviewed the triage vital signs and the nursing notes.  Pertinent labs & imaging results that were available during my care of the patient were reviewed by me and considered in my medical decision making (see chart for details).     Pt is a [id number] female who is currently [redacted] weeks pregnant who presents for left calf pain and chest discomfort.  She has no history of PE or DCT. On exam has positive Homan's sign.  Unfortunately, we are unable to rule out a DVT here.  Discussed risk and benefits of going to the emergency department with patient and recommended that she go to the ED for further evaluation.  She is agreeable.  She will drive herself to   regional.  I was [facility] staff updated with her suspected diagnosis and will await her arrival.   Final Clinical Impressions(s) / UC Diagnoses   Final diagnoses:  Right calf pain     Discharge Instructions      Please go to the emergency department to get an ultrasound of your leg to check for a DVT, deep vein thrombosis.  You have been advised to follow up immediately in the emergency department for concerning signs or symptoms as discussed during your visit. If you declined EMS transport, please have a family member take you directly to the ED at this time. Do not delay.   Based on concerns about condition, if you do not follow up in the ED, you may risk poor outcomes including worsening of condition, delayed treatment and potentially life threatening issues. If you have declined to go to the ED at this time, you should call your PCP immediately to set up a follow up appointment.   Go to ED for red flag symptoms, including; fevers you cannot reduce with Tylenol/Motrin, severe headaches, vision changes, numbness/weakness in part of the body, lethargy, confusion, intractable vomiting, severe dehydration, chest pain, breathing difficulty, severe persistent abdominal or pelvic pain, signs of severe infection (increased redness, swelling of an area), feeling faint or passing out, dizziness, etc. You should especially go to the ED for sudden acute worsening of condition if you do not elect to go at this time.       ED Prescriptions   None    PDMP not reviewed this encounter.   Katha Cabal, DO 03/20/22 1447

## 2022-03-20 NOTE — ED Notes (Signed)
Patient is being discharged from the Urgent Care and sent to the Emergency Department via POV . Per Dr. Rachael Darby, patient is in need of higher level of care due to possible DVT. Patient is aware and verbalizes understanding of plan of care.  Vitals:   03/20/22 1401  BP: (!) 144/77  Pulse: 71  Temp: 98.9 F (37.2 C)  SpO2: 98%

## 2022-03-20 NOTE — ED Triage Notes (Signed)
C/O constant left calf pain x 3 days.  Seen through [facility] for same today.  Sent to ED for [place]

## 2022-03-20 NOTE — Discharge Instructions (Signed)
Please go to the emergency department to get an ultrasound of your leg to check for a DVT, deep vein thrombosis.  You have been advised to follow up immediately in the emergency department for concerning signs or symptoms as discussed during your visit. If you declined EMS transport, please have a family member take you directly to the ED at this time. Do not delay.   Based on concerns about condition, if you do not follow up in the ED, you may risk poor outcomes including worsening of condition, delayed treatment and potentially life threatening issues. If you have declined to go to the ED at this time, you should call your PCP immediately to set up a follow up appointment.   Go to ED for red flag symptoms, including; fevers you cannot reduce with Tylenol/Motrin, severe headaches, vision changes, numbness/weakness in part of the body, lethargy, confusion, intractable vomiting, severe dehydration, chest pain, breathing difficulty, severe persistent abdominal or pelvic pain, signs of severe infection (increased redness, swelling of an area), feeling faint or passing out, dizziness, etc. You should especially go to the ED for sudden acute worsening of condition if you do not elect to go at this time.

## 2022-03-20 NOTE — ED Triage Notes (Addendum)
Pt c/o LT calf pain x3 days, pt states area feels tight, pt denies any injury to area.   Pt states she is about [redacted] weeks pregnant

## 2022-03-20 NOTE — ED Provider Notes (Signed)
[facility] Provider Note    Event Date/Time   First MD Initiated Contact with Patient 03/20/22 1514     (approximate)   History   Leg Pain   HPI  Laura Lucero is a 31 y.o. female medical history of morbid obesity anemia presents with leg pain.  Started about 3 days ago.  Just woke up and noticed the left calf is painful.  Feels like a cramping sensation.  Denies any injury he is known precipitant.  He had very brief chest pain several days ago but has not had any pain since.  Denies shortness of breath.  Patient seen at urgent care earlier referred to ED for ultrasound.  Of note she is [redacted] weeks pregnant.  History of DVT/PE, recent travel.     Past Medical History:  Diagnosis Date   Anemia    Morbid obesity (HCC)    Nausea     Patient Active Problem List   Diagnosis Date Noted   Cervical high risk human papillomavirus (HPV) DNA test positive 07/28/2019   Morbid obesity (HCC) 05/30/2018   BMI 40.0-44.9, adult (HCC) 05/30/2018   History of cesarean delivery 03/07/2016     Physical Exam  Triage Vital Signs: ED Triage Vitals  Enc Vitals Group     BP 03/20/22 1507 106/70     Pulse Rate 03/20/22 1507 66     Resp 03/20/22 1507 17     Temp 03/20/22 1507 98.7 F (37.1 C)     Temp Source 03/20/22 1507 Oral     SpO2 03/20/22 1507 100 %     Weight 03/20/22 1511 209 lb 14.1 oz (95.2 kg)     Height 03/20/22 1511 5\' 3"  (1.6 m)     Head Circumference --      Peak Flow --      Pain Score 03/20/22 1511 8     Pain Loc --      Pain Edu? --      Excl. in GC? --     Most recent vital signs: Vitals:   03/20/22 1507  BP: 106/70  Pulse: 66  Resp: 17  Temp: 98.7 F (37.1 C)  SpO2: 100%     General: Awake, no distress.  CV:  Good peripheral perfusion.  Resp:  Normal effort.  Abd:  No distention.  Neuro:             Awake, Alert, Oriented x 3  Other:  No swelling or asymmetry of the calf noted, there is mild tenderness to palpation in the  posterior mid calf without palpable cord 2+ DP pulses bilaterally   ED Results / Procedures / Treatments  Labs (all labs ordered are listed, but only abnormal results are displayed) Labs Reviewed - No data to display   EKG     RADIOLOGY Reviewed and interpreted the Doppler ultrasound of the left leg which does not show a blood clot   PROCEDURES:  Critical Care performed: No  Procedures    MEDICATIONS ORDERED IN ED: Medications - No data to display   IMPRESSION / MDM / ASSESSMENT AND PLAN / ED COURSE  I reviewed the triage vital signs and the nursing notes.                              Patient's presentation is most consistent with acute complicated illness / injury requiring diagnostic workup.  Differential diagnosis includes, but is not limited  to, DVT, muscle strain, Baker's cyst, superficial thrombophlebitis  The patient is a 31 year old female who is currently in the first trimester pregnancy who presents with left calf pain for 3 days.  There is no preceding trauma.  She has no history of DVT or PE.  No current chest pain or dyspnea.  Several days ago she had very brief chest pain but this was short-lived and has not recurred.  Vital signs are reassuring she is satting 100% on room air she is not tachycardic.  She looks well.  Exam of the calf is rather benign there is no obvious swelling or asymmetry no edema she is neurovascular intact.  The posterior mid calf is mildly tender to palpation without palpable cord.  DVT ultrasound ordered to rule out DVT especially as patient is at high risk given she is pregnant.    DVT study is negative.  Did recommend to the patient that if she has ongoing symptoms in 1 week that she follow-up for repeat ultrasound which we ordered by her OB/GYN or primary doctor.  Recommended RICE and Tylenol.  She is appropriate for discharge.   FINAL CLINICAL IMPRESSION(S) / ED DIAGNOSES   Final diagnoses:  Pain of left calf     Rx / DC  Orders   ED Discharge Orders     None        Note:  This document was prepared using Dragon voice recognition software and may include unintentional dictation errors.   Georga Hacking, MD 03/20/22 [phone]

## 2022-03-20 NOTE — Discharge Instructions (Signed)
The ultrasound of your leg was negative for a blood clot.  If you have ongoing symptoms in 1 week, please follow-up with your primary doctor or your OB/GYN as you may need to have a repeat ultrasound.  You can take Tylenol rest elevate and ice the area.

## 2022-04-14 ENCOUNTER — Telehealth: Payer: Self-pay | Admitting: Family Medicine

## 2022-04-14 NOTE — Telephone Encounter (Signed)
Pt is pregant, has New OB appt 04/27/22, states she experienced bleeding today while at work, blood was thin pink liquid , felt like she was urinating; bleeding stopped, but now feels like it is starting up again & feels like she is urinating. Please advise.

## 2022-04-14 NOTE — Telephone Encounter (Signed)
Returned phone call to patient. LM that if patient is experiencing bleeding she should go to the ED. Patient has a new OB appointment at [place] 04/27/22. Marland KitchenJenetta Downer, RN

## 2022-04-27 ENCOUNTER — Ambulatory Visit (INDEPENDENT_AMBULATORY_CARE_PROVIDER_SITE_OTHER): Payer: Medicaid Other

## 2022-04-27 ENCOUNTER — Telehealth: Payer: Self-pay

## 2022-04-27 DIAGNOSIS — Z3689 Encounter for other specified antenatal screening: Secondary | ICD-10-CM

## 2022-04-27 DIAGNOSIS — O0990 Supervision of high risk pregnancy, unspecified, unspecified trimester: Secondary | ICD-10-CM | POA: Insufficient documentation

## 2022-04-27 DIAGNOSIS — Z3480 Encounter for supervision of other normal pregnancy, unspecified trimester: Secondary | ICD-10-CM | POA: Insufficient documentation

## 2022-04-27 DIAGNOSIS — Z369 Encounter for antenatal screening, unspecified: Secondary | ICD-10-CM

## 2022-04-27 HISTORY — DX: Encounter for supervision of other normal pregnancy, unspecified trimester: Z34.80

## 2022-04-27 NOTE — Progress Notes (Signed)
Laura Lucero,  Patient appointments have been scheduled. NOB Physical with Opal Sidles. Thank you

## 2022-04-27 NOTE — Progress Notes (Signed)
New OB Intake  I connected with  Laura Lucero on 04/27/22 at 11:15 AM EDT by telephone Video Visit and verified that I am speaking with the correct person using two identifiers. Nurse is located at [employer] and pt is located at work.  I explained I am completing New OB Intake today. We discussed her EDD of 11/20/2022 that is based on LMP of 02/13/2022. Pt is [id number]/[id number]. I reviewed her allergies, medications, Medical/Surgical/OB history, and appropriate screenings. Based on history, this is a/an pregnancy uncomplicated .   Patient Active Problem List   Diagnosis Date Noted   Cervical high risk human papillomavirus (HPV) DNA test positive 07/28/2019   Morbid obesity ([place]) 05/30/2018   BMI 40.0-44.9, adult ([place]) 05/30/2018   History of cesarean delivery 03/07/2016    Concerns addressed today None  Delivery Plans:  Plans to deliver at [facility].  Anatomy US Explained first scheduled [place] will be scheduled soon for dating and an anatomy scan will be done at 20 weeks.  Labs Discussed genetic screening with patient. Patient desires genetic testing to be drawn with new OB lab. Discussed possible labs to be drawn at new OB appointment.  COVID Vaccine Patient has had COVID vaccine.   Social Determinants of Health Food Insecurity: expresses food insecurity. Information given on local food banks. Transportation: Patient denies transportation needs.  First visit review I reviewed new OB appt with pt. I explained she will have ob bloodwork and pap smear/pelvic exam if indicated. Explained pt will be seen by Rod Can, CNM at first visit; encounter routed to appropriate provider.   Cleophas Dunker, [facility] 04/27/2022  11:34 AM

## 2022-04-27 NOTE — Telephone Encounter (Signed)
Call to client to reschedule missed [facility] IP appt 04/27/2022. Appt rescheduled for 05/04/2022 with arrival time of 1245. When appt scheduled, noticed on appt desk that client had new OB intake appt yesterday at [facility], has [place] scheduled and has 05/13/2022 new OB appt scheduled with provider. Per client, she will keep [place] OB appt. [facility] IP appt cancelled. Rich Number, RN

## 2022-04-28 ENCOUNTER — Other Ambulatory Visit: Payer: Medicaid Other

## 2022-04-28 ENCOUNTER — Telehealth: Payer: Self-pay | Admitting: Advanced Practice Midwife

## 2022-04-28 NOTE — Telephone Encounter (Signed)
Reached out to pt to reschedule NOB labs that were scheduled for 10/19 at 11:00.  Pt rescheduled for 04/29/22 at 10:40.

## 2022-04-29 ENCOUNTER — Other Ambulatory Visit: Payer: Medicaid Other

## 2022-04-29 DIAGNOSIS — Z3480 Encounter for supervision of other normal pregnancy, unspecified trimester: Secondary | ICD-10-CM

## 2022-04-29 DIAGNOSIS — Z369 Encounter for antenatal screening, unspecified: Secondary | ICD-10-CM

## 2022-04-30 LAB — CBC/D/PLT+RPR+RH+ABO+RUBIGG...
Antibody Screen: NEGATIVE
Basophils Absolute: 0 10*3/uL (ref 0.0–0.2)
Basos: 0 %
EOS (ABSOLUTE): 0 10*3/uL (ref 0.0–0.4)
Eos: 1 %
HCV Ab: NONREACTIVE
HIV Screen 4th Generation wRfx: NONREACTIVE
Hematocrit: 32.4 % — ABNORMAL LOW (ref 34.0–46.6)
Hemoglobin: 10.5 g/dL — ABNORMAL LOW (ref 11.1–15.9)
Hepatitis B Surface Ag: NEGATIVE
Immature Grans (Abs): 0 10*3/uL (ref 0.0–0.1)
Immature Granulocytes: 0 %
Lymphocytes Absolute: 1.5 10*3/uL (ref 0.7–3.1)
Lymphs: 22 %
MCH: 28.8 pg (ref 26.6–33.0)
MCHC: 32.4 g/dL (ref 31.5–35.7)
MCV: 89 fL (ref 79–97)
Monocytes Absolute: 0.2 10*3/uL (ref 0.1–0.9)
Monocytes: 4 %
Neutrophils Absolute: 4.8 10*3/uL (ref 1.4–7.0)
Neutrophils: 73 %
Platelets: 260 10*3/uL (ref 150–450)
RBC: 3.64 x10E6/uL — ABNORMAL LOW (ref 3.77–5.28)
RDW: 15.6 % — ABNORMAL HIGH (ref 11.7–15.4)
RPR Ser Ql: NONREACTIVE
Rh Factor: POSITIVE
Rubella Antibodies, IGG: <0.9 index — ABNORMAL LOW (ref >0.99)
Varicella zoster IgG: <135 index — ABNORMAL LOW (ref >165)
WBC: 6.6 10*3/uL (ref 3.4–10.8)

## 2022-04-30 LAB — HCV INTERPRETATION

## 2022-05-04 LAB — MATERNIT 21 PLUS CORE, BLOOD
Fetal Fraction: 9
Result (T21): NEGATIVE
Trisomy 13 (Patau syndrome): NEGATIVE
Trisomy 18 (Edwards syndrome): NEGATIVE
Trisomy 21 (Down syndrome): NEGATIVE

## 2022-05-10 ENCOUNTER — Other Ambulatory Visit: Payer: Medicaid Other

## 2022-05-10 ENCOUNTER — Other Ambulatory Visit: Payer: Self-pay

## 2022-05-10 DIAGNOSIS — Z3480 Encounter for supervision of other normal pregnancy, unspecified trimester: Secondary | ICD-10-CM

## 2022-05-10 DIAGNOSIS — Z369 Encounter for antenatal screening, unspecified: Secondary | ICD-10-CM

## 2022-05-12 ENCOUNTER — Ambulatory Visit: Admission: RE | Admit: 2022-05-12 | Payer: Medicaid Other | Source: Ambulatory Visit

## 2022-05-13 ENCOUNTER — Telehealth: Payer: Self-pay | Admitting: Advanced Practice Midwife

## 2022-05-13 ENCOUNTER — Encounter: Payer: Medicaid Other | Admitting: Advanced Practice Midwife

## 2022-05-13 NOTE — Telephone Encounter (Signed)
Reached out to the pt to reschedule NOB appt that was scheduled for 11/3 at 9:55 with JEG.  Left message for pt to call back to reschedule.

## 2022-05-16 ENCOUNTER — Encounter: Payer: Self-pay | Admitting: Advanced Practice Midwife

## 2022-05-16 NOTE — Telephone Encounter (Signed)
Reached out to pt (2x) to reschedule NOB appt that was scheduled for 11/3 at 9:55 with JEG.  Left message for pt to call back to reschedule.  Will send a MyChart letter.

## 2022-05-18 ENCOUNTER — Ambulatory Visit
Admission: RE | Admit: 2022-05-18 | Discharge: 2022-05-18 | Disposition: A | Discharge status: Home / Self Care | Payer: Medicaid Other | Source: Ambulatory Visit | Attending: Advanced Practice Midwife | Admitting: Advanced Practice Midwife

## 2022-05-18 ENCOUNTER — Other Ambulatory Visit: Payer: Self-pay | Admitting: Advanced Practice Midwife

## 2022-05-18 DIAGNOSIS — Z369 Encounter for antenatal screening, unspecified: Secondary | ICD-10-CM | POA: Insufficient documentation

## 2022-05-18 DIAGNOSIS — Z3480 Encounter for supervision of other normal pregnancy, unspecified trimester: Secondary | ICD-10-CM

## 2022-05-18 DIAGNOSIS — O209 Hemorrhage in early pregnancy, unspecified: Secondary | ICD-10-CM | POA: Insufficient documentation

## 2022-05-18 DIAGNOSIS — Z3A13 13 weeks gestation of pregnancy: Secondary | ICD-10-CM | POA: Insufficient documentation

## 2022-05-20 ENCOUNTER — Telehealth: Payer: Self-pay

## 2022-05-20 ENCOUNTER — Encounter: Payer: Self-pay | Admitting: Obstetrics

## 2022-05-20 NOTE — Telephone Encounter (Signed)
[facility] contacted office about STAT report from 05/18/22  IMPRESSION: Single live intrauterine gestation at 13 weeks 5 days EGA.   Moderate to large subchronic hemorrhage.   Nonvisualization of ovaries.  Please advise, provider is not in office until 05/30/22, will forward to [place] and provider on call. KW

## 2022-05-27 ENCOUNTER — Ambulatory Visit: Payer: Medicaid Other | Admitting: Certified Nurse Midwife

## 2022-06-15 DIAGNOSIS — Z332 Encounter for elective termination of pregnancy: Secondary | ICD-10-CM | POA: Insufficient documentation

## 2022-06-15 HISTORY — DX: Encounter for elective termination of pregnancy: Z33.2

## 2022-12-18 ENCOUNTER — Ambulatory Visit (HOSPITAL_COMMUNITY): Payer: Medicaid Other

## 2023-01-09 ENCOUNTER — Ambulatory Visit: Payer: Medicaid Other | Admitting: Licensed Practical Nurse

## 2023-02-05 ENCOUNTER — Ambulatory Visit (HOSPITAL_COMMUNITY)
Admission: RE | Admit: 2023-02-05 | Discharge: 2023-02-05 | Disposition: A | Discharge status: Home / Self Care | Payer: Medicaid Other | Source: Ambulatory Visit | Attending: Emergency Medicine | Admitting: Emergency Medicine

## 2023-02-05 ENCOUNTER — Ambulatory Visit: Payer: Medicaid Other

## 2023-02-05 ENCOUNTER — Encounter (HOSPITAL_COMMUNITY): Payer: Self-pay

## 2023-02-05 VITALS — BP 109/75 | HR 69 | Temp 98.7°F | Resp 14

## 2023-02-05 DIAGNOSIS — Z113 Encounter for screening for infections with a predominantly sexual mode of transmission: Secondary | ICD-10-CM | POA: Insufficient documentation

## 2023-02-05 NOTE — ED Provider Notes (Signed)
[facility]    CSN: [id number] Arrival date & time: 02/05/23  1606      History   Chief Complaint Chief Complaint  Patient presents with   SEXUALLY TRANSMITTED DISEASE    Entered by patient    HPI Laura Lucero is a 32 y.o. female.  Here for STD testing Denies known exposure, not having any symptoms. No vaginal discharge, odor, itching, rash, abd pain, dysuria  Past Medical History:  Diagnosis Date   Anemia    Morbid obesity (HCC)    Nausea     Patient Active Problem List   Diagnosis Date Noted   Supervision of other normal pregnancy, antepartum 04/27/2022   Cervical high risk human papillomavirus (HPV) DNA test positive 07/28/2019   Morbid obesity (HCC) 05/30/2018   BMI 40.0-44.9, adult (HCC) 05/30/2018   History of cesarean delivery 03/07/2016    Past Surgical History:  Procedure Laterality Date   CESAREAN SECTION  2015   CESAREAN SECTION N/A 03/07/2016   Procedure: CESAREAN SECTION;  Surgeon: Conard Novak, MD;  Location: [facility] ORS;  Service: Obstetrics;  Laterality: N/A;   CESAREAN SECTION N/A 02/21/2018   Procedure: CESAREAN SECTION;  Surgeon: Natale Milch, MD;  Location: [facility] ORS;  Service: Obstetrics;  Laterality: N/A;  Time of Birth: 09:09 Sex: Female Weight:    CYST REMOVAL NECK      OB History     Gravida  9   Para  4   Term  4   Preterm      AB  4   Living  4      SAB  4   IAB      Ectopic      Multiple  0   Live Births  4            Home Medications    Prior to Admission medications   Medication Sig Start Date End Date Taking? Authorizing Provider  medroxyPROGESTERone Acetate 150 MG/ML SUSY INJECT ONE ML (150MG  TOTAL) INTO THE MUSCLE EVERY 3 MONTHS. 04/02/19 04/06/20  Natale Milch, MD  phentermine (ADIPEX-P) 37.5 MG tablet Take 1 tablet (37.5 mg total) by mouth daily before breakfast. 05/30/18 04/06/20  Conard Novak, MD    Family History Family History  Problem Relation Age of  Onset   Healthy Mother    Healthy Father    Healthy Sister    Healthy Sister    Healthy Sister    Healthy Sister    Healthy Maternal Grandmother    Healthy Maternal Grandfather    Healthy Paternal Grandmother    Healthy Paternal Grandfather     Social History Social History   Tobacco Use   Smoking status: Never   Smokeless tobacco: Never  Vaping Use   Vaping status: Some Days   Substances: Nicotine, Flavoring  Substance Use Topics   Alcohol use: Not Currently    Alcohol/week: 1.0 standard drink of alcohol    Types: 1 Shots of liquor per week   Drug use: Not Currently    Types: Marijuana    Comment: once a month     Allergies   Ampicillin   Review of Systems Review of Systems Per HPI  Physical Exam Triage Vital Signs ED Triage Vitals  Encounter Vitals Group     BP 02/05/23 1633 109/75     Systolic BP Percentile --      Diastolic BP Percentile --      Pulse Rate 02/05/23 1633 69  Resp 02/05/23 1633 14     Temp 02/05/23 1633 98.7 F (37.1 C)     Temp Source 02/05/23 1633 Oral     SpO2 02/05/23 1633 98 %     Weight --      Height --      Head Circumference --      Peak Flow --      Pain Score 02/05/23 1634 0     Pain Loc --      Pain Education --      Exclude from Growth Chart --    No data found.  Updated Vital Signs BP 109/75 (BP Location: Left Arm)   Pulse 69   Temp 98.7 F (37.1 C) (Oral)   Resp 14   LMP 01/17/2023 (Exact Date)   SpO2 98%   Breastfeeding Unknown    Physical Exam Vitals and nursing note reviewed.  Constitutional:      General: She is not in acute distress.    Appearance: Normal appearance.  Cardiovascular:     Rate and Rhythm: Normal rate and regular rhythm.  Pulmonary:     Effort: Pulmonary effort is normal.  Neurological:     Mental Status: She is alert and oriented to person, place, and time.      UC Treatments / Results  Labs (all labs ordered are listed, but only abnormal results are displayed) Labs  Reviewed  CERVICOVAGINAL ANCILLARY ONLY    EKG   Radiology No results found.  Procedures Procedures (including critical care time)  Medications Ordered in UC Medications - No data to display  Initial Impression / Assessment and Plan / UC Course  I have reviewed the triage vital signs and the nursing notes.  Pertinent labs & imaging results that were available during my care of the patient were reviewed by me and considered in my medical decision making (see chart for details).  Cytology swab pending Patient has mychart and will see results, discussed will call and treat anything positive All questions answered  Final Clinical Impressions(s) / UC Diagnoses   Final diagnoses:  Screen for STD (sexually transmitted disease)     Discharge Instructions      We will call you if anything on your swab returns positive. You can also see these results on MyChart. Please abstain from sexual intercourse until your results return.     ED Prescriptions   None    PDMP not reviewed this encounter.   Marlow Baars, [place] 02/05/23 1701

## 2023-02-05 NOTE — ED Triage Notes (Signed)
Patient is requesting STD testing and denies any symptoms.

## 2023-02-05 NOTE — Discharge Instructions (Addendum)
We will call you if anything on your swab returns positive. You can also see these results on MyChart. Please abstain from sexual intercourse until your results return. 

## 2023-02-06 ENCOUNTER — Telehealth: Payer: Self-pay

## 2023-02-06 MED ORDER — METRONIDAZOLE 500 MG PO TABS: 500.0000 mg | ORAL_TABLET | Freq: Two times a day (BID) | ORAL | 0 refills | Status: AC

## 2023-02-06 MED ORDER — METRONIDAZOLE 500 MG PO TABS: 500.0000 mg | ORAL_TABLET | Freq: Two times a day (BID) | ORAL | 0 refills | Status: DC

## 2023-02-06 NOTE — Telephone Encounter (Signed)
Pt returned phone call. Questions asked and answered. Pharmacy changed per pt request.

## 2023-02-06 NOTE — Telephone Encounter (Signed)
Per protocol, pt requires tx with metronidazole. Attempted to reach patient x1. LVM. Rx sent to pharmacy of file.

## 2023-06-05 ENCOUNTER — Ambulatory Visit (HOSPITAL_BASED_OUTPATIENT_CLINIC_OR_DEPARTMENT_OTHER): Payer: Medicaid Other

## 2023-06-06 ENCOUNTER — Ambulatory Visit (HOSPITAL_COMMUNITY): Payer: Medicaid Other

## 2023-09-05 ENCOUNTER — Ambulatory Visit
Admission: EM | Admit: 2023-09-05 | Discharge: 2023-09-05 | Disposition: A | Discharge status: Home / Self Care | Payer: Medicaid Other | Attending: Family Medicine | Admitting: Family Medicine

## 2023-09-05 DIAGNOSIS — R072 Precordial pain: Secondary | ICD-10-CM

## 2023-09-05 DIAGNOSIS — R0789 Other chest pain: Secondary | ICD-10-CM

## 2023-09-05 LAB — POCT URINE PREGNANCY: Preg Test, Ur: POSITIVE — AB

## 2023-09-05 NOTE — ED Triage Notes (Addendum)
+   Pregnancy test at home, confirmation will be needed.

## 2023-09-05 NOTE — ED Triage Notes (Signed)
"  I have had Chest pain for 2 days". "When I move at all I have this pain". No history of this before. I do have PCP (has not reached out to them as of yet). No nausea or vomiting. Pain is mid-sternal with no radiation.

## 2023-09-05 NOTE — Discharge Instructions (Addendum)
 Your EKG was normal.  Your vital signs were also normal.  The urine pregnancy test was positive. You are probably about [redacted] weeks pregnant.  Take Tylenol 500 mg--2 every 6 hours as needed for pain.  Heating pad to the sore area can also help.  Please follow-up with your primary care  Also make an appointment with your OB/GYN

## 2023-09-05 NOTE — ED Provider Notes (Signed)
 [facility]    CSN: [id number] Arrival date & time: 09/05/23  1309      History   Chief Complaint Chief Complaint  Patient presents with   Chest Pain    HPI Laura Lucero is a 33 y.o. female.    Chest Pain Here for central anterior chest pain that is been bothering her for about 2 days.  No trauma or fall.  No cough or congestion and no shortness of breath.  It worsens with movement of her arms or with leaning over or raising her head.  She is allergic to penicillins.  Last menstrual cycle was December 30.  She has been able pregnancy test at home that was positive.  Past Medical History:  Diagnosis Date   Anemia    Morbid obesity (HCC)    Nausea     Patient Active Problem List   Diagnosis Date Noted   Therapeutic abortion in second trimester 06/15/2022   Supervision of other normal pregnancy, antepartum 04/27/2022   Cervical high risk human papillomavirus (HPV) DNA test positive 07/28/2019   Morbid obesity (HCC) 05/30/2018   BMI 40.0-44.9, adult (HCC) 05/30/2018   History of cesarean delivery 03/07/2016    Past Surgical History:  Procedure Laterality Date   CESAREAN SECTION  2015   CESAREAN SECTION N/A 03/07/2016   Procedure: CESAREAN SECTION;  Surgeon: Conard Novak, MD;  Location: [facility] ORS;  Service: Obstetrics;  Laterality: N/A;   CESAREAN SECTION N/A 02/21/2018   Procedure: CESAREAN SECTION;  Surgeon: Natale Milch, MD;  Location: [facility] ORS;  Service: Obstetrics;  Laterality: N/A;  Time of Birth: 09:09 Sex: Female Weight:    CYST REMOVAL NECK      OB History     Gravida  9   Para  4   Term  4   Preterm      AB  4   Living  4      SAB  4   IAB      Ectopic      Multiple  0   Live Births  4            Home Medications    Prior to Admission medications   Medication Sig Start Date End Date Taking? Authorizing Provider  medroxyPROGESTERone Acetate 150 MG/ML SUSY INJECT ONE ML (150MG  TOTAL) INTO THE  MUSCLE EVERY 3 MONTHS. 04/02/19 04/06/20  Natale Milch, MD  phentermine (ADIPEX-P) 37.5 MG tablet Take 1 tablet (37.5 mg total) by mouth daily before breakfast. 05/30/18 04/06/20  Conard Novak, MD    Family History Family History  Problem Relation Age of Onset   Healthy Mother    Healthy Father    Healthy Sister    Healthy Sister    Healthy Sister    Healthy Sister    Healthy Maternal Grandmother    Healthy Maternal Grandfather    Healthy Paternal Grandmother    Healthy Paternal Grandfather     Social History Social History   Tobacco Use   Smoking status: Never   Smokeless tobacco: Never  Vaping Use   Vaping status: Former   Substances: Nicotine, Flavoring  Substance Use Topics   Alcohol use: Not Currently    Alcohol/week: 1.0 standard drink of alcohol    Types: 1 Shots of liquor per week   Drug use: Not Currently    Types: Marijuana    Comment: once a month     Allergies   Ampicillin and Penicillins   Review  of Systems Review of Systems  Cardiovascular:  Positive for chest pain.     Physical Exam Triage Vital Signs ED Triage Vitals  Encounter Vitals Group     BP 09/05/23 1337 100/69     Systolic BP Percentile --      Diastolic BP Percentile --      Pulse Rate 09/05/23 1337 84     Resp 09/05/23 1337 18     Temp 09/05/23 1337 98.2 F (36.8 C)     Temp Source 09/05/23 1337 Oral     SpO2 09/05/23 1337 98 %     Weight 09/05/23 1335 210 lb (95.3 kg)     Height 09/05/23 1335 5\' 3"  (1.6 m)     Head Circumference --      Peak Flow --      Pain Score 09/05/23 1332 8     Pain Loc --      Pain Education --      Exclude from Growth Chart --    No data found.  Updated Vital Signs BP 100/69 (BP Location: Right Arm)   Pulse 84   Temp 98.2 F (36.8 C) (Oral)   Resp 18   Ht 5\' 3"  (1.6 m)   Wt 95.3 kg   LMP 07/10/2023 (Exact Date)   SpO2 98%   BMI 37.20 kg/m   Visual Acuity Right Eye Distance:   Left Eye Distance:   Bilateral  Distance:    Right Eye Near:   Left Eye Near:    Bilateral Near:     Physical Exam Vitals reviewed.  Constitutional:      General: She is not in acute distress.    Appearance: She is not ill-appearing, toxic-appearing or diaphoretic.  Cardiovascular:     Rate and Rhythm: Normal rate and regular rhythm.     Heart sounds: No murmur heard. Pulmonary:     Effort: Pulmonary effort is normal. No respiratory distress.     Breath sounds: Normal breath sounds. No stridor. No wheezing, rhonchi or rales.  Chest:     Chest wall: Tenderness (Over costosternal junctions at about ribs 6.) present.  Musculoskeletal:     Cervical back: Neck supple.  Lymphadenopathy:     Cervical: No cervical adenopathy.  Neurological:     Mental Status: She is alert.      UC Treatments / Results  Labs (all labs ordered are listed, but only abnormal results are displayed) Labs Reviewed  POCT URINE PREGNANCY - Abnormal; Notable for the following components:      Result Value   Preg Test, Ur Positive (*)    All other components within normal limits    EKG   Radiology No results found.  Procedures Procedures (including critical care time)  Medications Ordered in UC Medications - No data to display  Initial Impression / Assessment and Plan / UC Course  I have reviewed the triage vital signs and the nursing notes.  Pertinent labs & imaging results that were available during my care of the patient were reviewed by me and considered in my medical decision making (see chart for details).     EKG is benign.  Her pregnancy test is positive   She will take Tylenol as needed and use warm compress or heating pad.  She will follow-up with her primary care and with her OB/GYN. Final Clinical Impressions(s) / UC Diagnoses   Final diagnoses:  Precordial pain  Chest wall pain     Discharge Instructions  Your EKG was normal.  Your vital signs were also normal.  The urine pregnancy test  was positive. You are probably about [redacted] weeks pregnant.  Take Tylenol 500 mg--2 every 6 hours as needed for pain.  Heating pad to the sore area can also help.  Please follow-up with your primary care  Also make an appointment with your OB/GYN     ED Prescriptions   None    PDMP not reviewed this encounter.   Zenia Resides, MD 09/05/23 [phone]

## 2023-10-05 NOTE — Progress Notes (Unsigned)
 New OB Intake  I connected with  Arcola Jansky on 10/06/23 at 11:15 AM EDT by Video Visit and verified that I am speaking with the correct person using two identifiers. Nurse is located at [facility] and pt is located at [employer]  I discussed the limitations, risks, security and privacy concerns of performing an evaluation and management service by telephone and the availability of in person appointments. I also discussed with the patient that there may be a patient responsible charge related to this service. The patient expressed understanding and agreed to proceed.  I explained I am completing New OB Intake today. We discussed her EDD of *** that is based on LMP of ***. Pt is G***/P***. I reviewed her allergies, medications, Medical/Surgical/OB history, and appropriate screenings. There are cats in the home  {yes/no:63} If yes {Desc; indoor/outdoor:13239}. Based on history, this is a/an pregnancy {Complicated/Uncomplicated Pregnancy:20185} . Her obstetrical history is significant for {ob risk factors:10154}.  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  Anatomy US Explained first scheduled [place] will be around 19 weeks. Anatomy US scheduled for *** at ***. Pt notified to arrive at ***.  Labs Discussed genetic screening with patient. Patient *** genetic testing to be drawn at new OB visit. Discussed possible labs to be drawn at new OB appointment.  COVID Vaccine Patient {HAS/HAS NOT:20194} had COVID vaccine.   Social Determinants of Health Food Insecurity: {VQQV9:56387} WIC  Referral: Patient {ACTION; IS/IS FIE:[id number]} interested in referral to [facility].  Transportation: {transportation:25542} Childcare: Discussed no children allowed at ultrasound appointments.   First visit review I reviewed new OB appt with pt. I explained she will have blood work and pap smear/pelvic exam if indicated. Explained pt will be seen by *** at first visit; encounter routed to appropriate provider.   Donnetta Hail, CMA 10/06/2023  11:13 AM

## 2023-10-06 ENCOUNTER — Telehealth

## 2023-10-06 ENCOUNTER — Telehealth: Payer: Self-pay | Admitting: Certified Nurse Midwife

## 2023-10-06 NOTE — Telephone Encounter (Signed)
 Pt has been rescheduled for the NOB Nurse Intake on 10/16/2023 at 1:15.

## 2023-10-06 NOTE — Telephone Encounter (Signed)
 Reached out to pt to reschedule NOB Nurse Intake appt that was scheduled on 10/06/2023 at 11:15.  Could not leave a message bc mailbox was not set up.  Will send a MyChart message to the pt.

## 2023-10-09 ENCOUNTER — Telehealth (INDEPENDENT_AMBULATORY_CARE_PROVIDER_SITE_OTHER)

## 2023-10-09 DIAGNOSIS — Z3689 Encounter for other specified antenatal screening: Secondary | ICD-10-CM

## 2023-10-09 DIAGNOSIS — Z3480 Encounter for supervision of other normal pregnancy, unspecified trimester: Secondary | ICD-10-CM

## 2023-10-09 NOTE — Patient Instructions (Addendum)
 First Trimester of Pregnancy  The first trimester of pregnancy starts on the first day of your last monthly period until the end of week 13. This is months 1 through 3 of pregnancy. A week after a sperm fertilizes an egg, the egg will implant into the wall of the uterus and begin to develop into a baby. Body changes during your first trimester Your body goes through many changes during pregnancy. The changes usually return to normal after your baby is born. Physical changes Your breasts may grow larger and may hurt. The area around your nipples may get darker. Your periods will stop. Your hair and nails may grow faster. You may pee more often. Health changes You may tire easily. Your gums may bleed and may be sensitive when you brush and floss. You may not feel hungry. You may have heartburn. You may throw up or feel like you may throw up. You may want to eat some foods, but not others. You may have headaches. You may have trouble pooping (constipation). Other changes Your emotions may change from day to day. You may have more dreams. Follow these instructions at home: Medicines Talk to your health care provider if you're taking medicines. Ask if the medicines are safe to take during pregnancy. Your provider may change the medicines that you take. Do not take any medicines unless told to by your provider. Take a prenatal vitamin that has at least 600 micrograms (mcg) of folic acid. Do not use herbal medicines, illegal substances, or medicines that are not approved by your provider. Eating and drinking While you're pregnant your body needs extra food for your growing baby. Talk with your provider about what to eat while pregnant. Activity Most women are able to exercise during pregnancy. Exercises may need to change as your pregnancy goes on. Talk to your provider about your activities and exercise routines. Relieving pain and discomfort Wear a good, supportive bra if your breasts  hurt. Rest with your legs raised if you have leg cramps or low back pain. Safety Wear your seatbelt at all times when you're in a car. Talk to your provider if someone hits you, hurts you, or yells at you. Talk with your provider if you're feeling sad or have thoughts of hurting yourself. Lifestyle Certain things can be harmful while you're pregnant. Follow these rules: Do not use hot tubs, steam rooms, or saunas. Do not douche. Do not use tampons or scented pads. Do not drink alcohol,smoke, vape, or use products with nicotine or tobacco in them. If you need help quitting, talk with your provider. Avoid cat litter boxes and soil used by cats. These things carry germs that can cause harm to your pregnancy and your baby. General instructions Keep all follow-up visits. It helps you and your unborn baby stay as healthy as possible. Write down your questions. Take them to your visits. Your provider will: Talk with you about your overall health. Give you advice or refer you to specialists who can help with different needs, including: Prenatal education classes. Mental health and counseling. Foods and healthy eating. Ask for help if you need help with food. Call your dentist and ask to be seen. Brush your teeth with a soft toothbrush. Floss gently. Where to find more information American Pregnancy Association: americanpregnancy.org [employer]: acog.org Office on [employer] Health: [employer] Contact a health care provider if: You feel dizzy, faint, or have a fever. You vomit or have watery poop (diarrhea) for 2  days or more. You have abnormal discharge or bleeding from your vagina. You have pain when you pee or your pee smells bad. You have cramps, pain, or pressure in your belly area. Get help right away if: You have trouble breathing or chest pain. You have any kind of injury, such as from a fall or a car crash. These symptoms may be an  emergency. Get help right away. Call 911. Do not wait to see if the symptoms will go away. Do not drive yourself to the hospital. This information is not intended to replace advice given to you by your health care provider. Make sure you discuss any questions you have with your health care provider. Document Revised: 03/30/2023 Document Reviewed: 10/28/2022 Elsevier Patient Education  2024 Elsevier Inc.Commonly Asked Questions During Pregnancy  Cats: A parasite can be excreted in cat feces.  To avoid exposure you need to have another person empty the little box.  If you must empty the litter box you will need to wear gloves.  Wash your hands after handling your cat.  This parasite can also be found in raw or undercooked meat so this should also be avoided.  Colds, Sore Throats, Flu: Please check your medication sheet to see what you can take for symptoms.  If your symptoms are unrelieved by these medications please call the office.  Dental Work: Most any dental work Agricultural consultant recommends is permitted.  X-rays should only be taken during the first trimester if absolutely necessary.  Your abdomen should be shielded with a lead apron during all x-rays.  Please notify your provider prior to receiving any x-rays.  Novocaine is fine; gas is not recommended.  If your dentist requires a note from [place] prior to dental work please call the office and we will provide one for you.  Exercise: Exercise is an important part of staying healthy during your pregnancy.  You may continue most exercises you were accustomed to prior to pregnancy.  Later in your pregnancy you will most likely notice you have difficulty with activities requiring balance like riding a bicycle.  It is important that you listen to your body and avoid activities that put you at a higher risk of falling.  Adequate rest and staying well hydrated are a must!  If you have questions about the safety of specific activities ask your provider.     Exposure to Children with illness: Try to avoid obvious exposure; report any symptoms to [place] when noted,  If you have chicken pos, red measles or mumps, you should be immune to these diseases.   Please do not take any vaccines while pregnant unless you have checked with your OB provider.  Fetal Movement: After 28 weeks we recommend you do "kick counts" twice daily.  Lie or sit down in a calm quiet environment and count your baby movements "kicks".  You should feel your baby at least 10 times per hour.  If you have not felt 10 kicks within the first hour get up, walk around and have something sweet to eat or drink then repeat for an additional hour.  If count remains less than 10 per hour notify your provider.  Fumigating: Follow your pest control agent's advice as to how long to stay out of your home.  Ventilate the area well before re-entering.  Hemorrhoids:   Most over-the-counter preparations can be used during pregnancy.  Check your medication to see what is safe to use.  It is important to use a  stool softener or fiber in your diet and to drink lots of liquids.  If hemorrhoids seem to be getting worse please call the office.   Hot Tubs:  Hot tubs Jacuzzis and saunas are not recommended while pregnant.  These increase your internal body temperature and should be avoided.  Intercourse:  Sexual intercourse is safe during pregnancy as long as you are comfortable, unless otherwise advised by your provider.  Spotting may occur after intercourse; report any bright red bleeding that is heavier than spotting.  Labor:  If you know that you are in labor, please go to the hospital.  If you are unsure, please call the office and let us help you decide what to do.  Lifting, straining, etc:  If your job requires heavy lifting or straining please check with your provider for any limitations.  Generally, you should not lift items heavier than that you can lift simply with your hands and arms (no back  muscles)  Painting:  Paint fumes do not harm your pregnancy, but may make you ill and should be avoided if possible.  Latex or water based paints have less odor than oils.  Use adequate ventilation while painting.  Permanents & Hair Color:  Chemicals in hair dyes are not recommended as they cause increase hair dryness which can increase hair loss during pregnancy.  " Highlighting" and permanents are allowed.  Dye may be absorbed differently and permanents may not hold as well during pregnancy.  Sunbathing:  Use a sunscreen, as skin burns easily during pregnancy.  Drink plenty of fluids; avoid over heating.  Tanning Beds:  Because their possible side effects are still unknown, tanning beds are not recommended.  Ultrasound Scans:  Routine ultrasounds are performed at approximately 20 weeks.  You will be able to see your baby's general anatomy an if you would like to know the gender this can usually be determined as well.  If it is questionable when you conceived you may also receive an ultrasound early in your pregnancy for dating purposes.  Otherwise ultrasound exams are not routinely performed unless there is a medical necessity.  Although you can request a scan we ask that you pay for it when conducted because insurance does not cover " patient request" scans.  Work: If your pregnancy proceeds without complications you may work until your due date, unless your physician or employer advises otherwise.  Round Ligament Pain/Pelvic Discomfort:  Sharp, shooting pains not associated with bleeding are fairly common, usually occurring in the second trimester of pregnancy.  They tend to be worse when standing up or when you remain standing for long periods of time.  These are the result of pressure of certain pelvic ligaments called "round ligaments".  Rest, Tylenol and heat seem to be the most effective relief.  As the womb and fetus grow, they rise out of the pelvis and the discomfort improves.  Please  notify the office if your pain seems different than that described.  It may represent a more serious condition.  Common Medications Safe in Pregnancy  Acne:      Constipation:  Benzoyl Peroxide     Colace  Clindamycin      Dulcolax Suppository  Topica Erythromycin     Fibercon  Salicylic Acid      Metamucil         Miralax AVOID:        Senakot   Accutane    Cough:  Retin-A       Cough  Drops  Tetracycline      Phenergan w/ Codeine if Rx  Minocycline      Robitussin (Plain & DM)  Antibiotics:     Crabs/Lice:  Ceclor       RID  Cephalosporins    AVOID:  E-Mycins      Kwell  Keflex  Macrobid/Macrodantin   Diarrhea:  Penicillin      Kao-Pectate  Zithromax      Imodium AD         PUSH FLUIDS AVOID:       Cipro     Fever:  Tetracycline      Tylenol (Regular or Extra  Minocycline       Strength)  Levaquin      Extra Strength-Do not          Exceed 8 tabs/24 hrs Caffeine:        200mg /day (equiv. To 1 cup of coffee or  approx. 3 12 oz sodas)         Gas: Cold/Hayfever:       Gas-X  Benadryl      Mylicon  Claritin       Phazyme  **Claritin-D        Chlor-Trimeton    Headaches:  Dimetapp      ASA-Free Excedrin  Drixoral-Non-Drowsy     Cold Compress  Mucinex (Guaifenasin)     Tylenol (Regular or Extra  Sudafed/Sudafed-12 Hour     Strength)  **Sudafed PE Pseudoephedrine   Tylenol Cold & Sinus     Vicks Vapor Rub  Zyrtec  **AVOID if Problems With Blood Pressure         Heartburn: Avoid lying down for at least 1 hour after meals  Aciphex      Maalox     Rash:  Milk of Magnesia     Benadryl    Mylanta       1% Hydrocortisone Cream  Pepcid  Pepcid Complete   Sleep Aids:  Prevacid      Ambien   Prilosec       Benadryl  Rolaids       Chamomile Tea  Tums (Limit 4/day)     Unisom         Tylenol PM         Warm milk-add vanilla or  Hemorrhoids:       Sugar for taste  Anusol/Anusol H.C.  (RX: Analapram 2.5%)  Sugar Substitutes:  Hydrocortisone OTC     Ok in  moderation  Preparation H      Tucks        Vaseline lotion applied to tissue with wiping    Herpes:     Throat:  Acyclovir      Oragel  Famvir  Valtrex     Vaccines:         Flu Shot Leg Cramps:       *Gardasil  Benadryl      Hepatitis A         Hepatitis B Nasal Spray:       Pneumovax  Saline Nasal Spray     Polio Booster         Tetanus Nausea:       Tuberculosis test or PPD  Vitamin B6 25 mg TID   AVOID:    Dramamine      *Gardasil  Emetrol       Live Poliovirus  Ginger Root 250 mg QID    MMR (measles, mumps &  High  Complex Carbs @ Bedtime    rebella)  Sea Bands-Accupressure    Varicella (Chickenpox)  Unisom 1/2 tab TID     *No known complications           If received before Pain:         Known pregnancy;   Darvocet       Resume series after  Lortab        Delivery  Percocet    Yeast:   Tramadol      Femstat  Tylenol 3      Gyne-lotrimin  Ultram       Monistat  Vicodin           MISC:         All Sunscreens           Hair Coloring/highlights          Insect Repellant's          (Including DEET)         Mystic Tans

## 2023-10-09 NOTE — Progress Notes (Signed)
 New OB Intake  I connected with  Laura Lucero on 10/09/23 at  2:35 PM EDT by MyChart Video Visit and verified that I am speaking with the correct person using two identifiers. Nurse is located at [facility] and pt is located at home.  I discussed the limitations, risks, security and privacy concerns of performing an evaluation and management service by telephone and the availability of in person appointments. I also discussed with the patient that there may be a patient responsible charge related to this service. The patient expressed understanding and agreed to proceed.  I explained I am completing New OB Intake today. We discussed her EDD of 05/11/2024 that is based on LMP of 08/05/23. Pt is G10/P4. I reviewed her allergies, medications, Medical/Surgical/OB history, and appropriate screenings. There are cats in the home? no. Based on history, this is a/an pregnancy uncomplicated . Her obstetrical history is significant for  none .  Patient Active Problem List   Diagnosis Date Noted   Therapeutic abortion in second trimester 06/15/2022   Supervision of other normal pregnancy, antepartum 04/27/2022   Cervical high risk human papillomavirus (HPV) DNA test positive 07/28/2019   Morbid obesity (HCC) 05/30/2018   BMI 40.0-44.9, adult (HCC) 05/30/2018   History of cesarean delivery 03/07/2016    Concerns addressed today:   Delivery Plans:  Plans to deliver at [facility].  Anatomy US Explained first scheduled [place] will be around 19 weeks.First ultrasound is scheduled for 10/12/23 at 9:15AM.   Labs Discussed genetic screening with patient. Patient will get genetic testing to be drawn at new OB visit. Discussed possible labs to be drawn at new OB appointment.  COVID Vaccine Patient has had COVID vaccine.   Social Determinants of Health Food Insecurity: denies food insecurity WIC Referral: Patient is not interested in referral to [facility].  Transportation: Patient denies  transportation needs. Childcare: Discussed no children allowed at ultrasound appointments.   First visit review I reviewed new OB appt with pt. I explained she will have blood work and pap smear/pelvic exam if indicated. Explained pt will be seen by Hartley Barefoot at first visit; encounter routed to appropriate provider.   Fonda Kinder, CMA 10/09/2023  2:39 PM

## 2023-10-12 ENCOUNTER — Other Ambulatory Visit: Payer: Self-pay | Admitting: Certified Nurse Midwife

## 2023-10-12 ENCOUNTER — Ambulatory Visit

## 2023-10-12 ENCOUNTER — Other Ambulatory Visit: Payer: Self-pay

## 2023-10-12 DIAGNOSIS — Z1379 Encounter for other screening for genetic and chromosomal anomalies: Secondary | ICD-10-CM

## 2023-10-12 DIAGNOSIS — O3680X0 Pregnancy with inconclusive fetal viability, not applicable or unspecified: Secondary | ICD-10-CM

## 2023-10-12 DIAGNOSIS — Z3A10 10 weeks gestation of pregnancy: Secondary | ICD-10-CM

## 2023-10-12 DIAGNOSIS — Z130 Encounter for screening for diseases of the blood and blood-forming organs and certain disorders involving the immune mechanism: Secondary | ICD-10-CM

## 2023-10-12 DIAGNOSIS — Z3481 Encounter for supervision of other normal pregnancy, first trimester: Secondary | ICD-10-CM

## 2023-10-12 DIAGNOSIS — T7589XA Other specified effects of external causes, initial encounter: Secondary | ICD-10-CM

## 2023-10-12 DIAGNOSIS — Z0283 Encounter for blood-alcohol and blood-drug test: Secondary | ICD-10-CM

## 2023-10-12 DIAGNOSIS — Z0184 Encounter for antibody response examination: Secondary | ICD-10-CM

## 2023-10-12 NOTE — Addendum Note (Signed)
 Addended by: Sheliah Hatch on: 10/12/2023 10:06 AM   Modules accepted: Orders

## 2023-10-13 LAB — CBC/D/PLT+RPR+RH+ABO+RUBIGG...
Antibody Screen: NEGATIVE
Basophils Absolute: 0 x10E3/uL (ref 0.0–0.2)
Basos: 0 %
EOS (ABSOLUTE): 0.1 x10E3/uL (ref 0.0–0.4)
Eos: 1 %
HCV Ab: NONREACTIVE
HIV Screen 4th Generation wRfx: NONREACTIVE
Hematocrit: 33.6 % — ABNORMAL LOW (ref 34.0–46.6)
Hemoglobin: 10.9 g/dL — ABNORMAL LOW (ref 11.1–15.9)
Hepatitis B Surface Ag: NEGATIVE
Immature Grans (Abs): 0 x10E3/uL (ref 0.0–0.1)
Immature Granulocytes: 0 %
Lymphocytes Absolute: 1.3 x10E3/uL (ref 0.7–3.1)
Lymphs: 22 %
MCH: 29.1 pg (ref 26.6–33.0)
MCHC: 32.4 g/dL (ref 31.5–35.7)
MCV: 90 fL (ref 79–97)
Monocytes Absolute: 0.3 x10E3/uL (ref 0.1–0.9)
Monocytes: 5 %
Neutrophils Absolute: 4.2 x10E3/uL (ref 1.4–7.0)
Neutrophils: 72 %
Platelets: 256 x10E3/uL (ref 150–450)
RBC: 3.75 x10E6/uL — ABNORMAL LOW (ref 3.77–5.28)
RDW: 14.3 % (ref 11.7–15.4)
RPR Ser Ql: NONREACTIVE
Rh Factor: POSITIVE
Rubella Antibodies, IGG: <0.9 {index} — ABNORMAL LOW
Varicella zoster IgG: NONREACTIVE
WBC: 5.9 x10E3/uL (ref 3.4–10.8)

## 2023-10-13 LAB — HCV INTERPRETATION

## 2023-10-13 LAB — URINALYSIS, ROUTINE W REFLEX MICROSCOPIC
Bilirubin, UA: NEGATIVE
Glucose, UA: NEGATIVE
Ketones, UA: NEGATIVE
Leukocytes,UA: NEGATIVE
Nitrite, UA: POSITIVE — AB
RBC, UA: NEGATIVE
Specific Gravity, UA: 1.027 (ref 1.005–1.030)
Urobilinogen, Ur: 1 mg/dL (ref 0.2–1.0)
pH, UA: 6.5 (ref 5.0–7.5)

## 2023-10-13 LAB — MICROSCOPIC EXAMINATION
Casts: NONE SEEN /LPF
Epithelial Cells (non renal): >10 /HPF — AB (ref 0–10)
RBC, Urine: NONE SEEN /HPF (ref 0–2)

## 2023-10-14 ENCOUNTER — Encounter: Payer: Self-pay | Admitting: Obstetrics and Gynecology

## 2023-10-16 ENCOUNTER — Telehealth

## 2023-10-16 LAB — URINE CULTURE, OB REFLEX

## 2023-10-16 LAB — CULTURE, OB URINE

## 2023-10-16 MED ORDER — NITROFURANTOIN MONOHYD MACRO 100 MG PO CAPS: 100.0000 mg | ORAL_CAPSULE | Freq: Two times a day (BID) | ORAL | 0 refills | Status: DC

## 2023-10-17 LAB — MATERNIT 21 PLUS CORE, BLOOD
Fetal Fraction: 15
Result (T21): NEGATIVE
Trisomy 13 (Patau syndrome): NEGATIVE
Trisomy 18 (Edwards syndrome): NEGATIVE
Trisomy 21 (Down syndrome): NEGATIVE

## 2023-10-31 ENCOUNTER — Other Ambulatory Visit: Payer: Self-pay | Admitting: Obstetrics and Gynecology

## 2023-10-31 ENCOUNTER — Encounter: Admitting: Certified Nurse Midwife

## 2023-10-31 DIAGNOSIS — O359XX0 Maternal care for (suspected) fetal abnormality and damage, unspecified, not applicable or unspecified: Secondary | ICD-10-CM

## 2023-10-31 DIAGNOSIS — O208 Other hemorrhage in early pregnancy: Secondary | ICD-10-CM

## 2023-11-01 ENCOUNTER — Ambulatory Visit (INDEPENDENT_AMBULATORY_CARE_PROVIDER_SITE_OTHER): Admitting: Obstetrics

## 2023-11-01 ENCOUNTER — Other Ambulatory Visit

## 2023-11-01 ENCOUNTER — Other Ambulatory Visit (HOSPITAL_COMMUNITY)
Admission: RE | Admit: 2023-11-01 | Discharge: 2023-11-01 | Disposition: A | Discharge status: Home / Self Care | Source: Ambulatory Visit | Attending: Obstetrics | Admitting: Obstetrics

## 2023-11-01 ENCOUNTER — Encounter: Payer: Self-pay | Admitting: Obstetrics

## 2023-11-01 VITALS — BP 95/62 | HR 82 | Ht 63.0 in | Wt 232.0 lb

## 2023-11-01 DIAGNOSIS — Z3491 Encounter for supervision of normal pregnancy, unspecified, first trimester: Secondary | ICD-10-CM

## 2023-11-01 DIAGNOSIS — O34219 Maternal care for unspecified type scar from previous cesarean delivery: Secondary | ICD-10-CM

## 2023-11-01 DIAGNOSIS — O0940 Supervision of pregnancy with grand multiparity, unspecified trimester: Secondary | ICD-10-CM | POA: Insufficient documentation

## 2023-11-01 DIAGNOSIS — Z131 Encounter for screening for diabetes mellitus: Secondary | ICD-10-CM

## 2023-11-01 DIAGNOSIS — Z113 Encounter for screening for infections with a predominantly sexual mode of transmission: Secondary | ICD-10-CM | POA: Diagnosis present

## 2023-11-01 DIAGNOSIS — O219 Vomiting of pregnancy, unspecified: Secondary | ICD-10-CM

## 2023-11-01 DIAGNOSIS — O99210 Obesity complicating pregnancy, unspecified trimester: Secondary | ICD-10-CM | POA: Insufficient documentation

## 2023-11-01 DIAGNOSIS — Z3A12 12 weeks gestation of pregnancy: Secondary | ICD-10-CM

## 2023-11-01 DIAGNOSIS — O0990 Supervision of high risk pregnancy, unspecified, unspecified trimester: Secondary | ICD-10-CM

## 2023-11-01 DIAGNOSIS — Z87440 Personal history of urinary (tract) infections: Secondary | ICD-10-CM

## 2023-11-01 MED ORDER — ASPIRIN 81 MG PO TBEC: 81.0000 mg | DELAYED_RELEASE_TABLET | Freq: Every day | ORAL | 2 refills | Status: DC

## 2023-11-01 MED ORDER — ONDANSETRON 4 MG PO TBDP: 4.0000 mg | ORAL_TABLET | Freq: Three times a day (TID) | ORAL | 2 refills | Status: DC | PRN

## 2023-11-01 NOTE — Progress Notes (Signed)
 OBSTETRIC INITIAL PRENATAL VISIT  Subjective:    Laura Lucero is being seen today for her first obstetrical visit.  This is not a planned pregnancy. She is a 33 y.o. [phone] female at [redacted]w[redacted]d gestation, Estimated Date of Delivery: 05/11/24 with Patient's last menstrual period was 08/05/2023., consistent with 10 week sono. Her obstetrical history is significant for  grand multiparity, prior cesarean x 3, elevated BMI . Relationship with FOB: significant other, not living together. Patient does intend to breast feed. Pregnancy history fully reviewed. Discrepancy between G7, 2nd trimester IAB by [employer]; problem list notes 19wks, pt reporting 22wks.   OB History  Gravida Para Term Preterm AB Living  9 4 4  0 4 4  SAB IAB Ectopic Multiple Live Births  2 2 0 0 4    # Outcome Date GA Lbr Len/2nd Weight Sex Type Anes PTL Lv  9 Current           8 IAB 10/2022 [redacted]w[redacted]d    TAB        Birth Comments: Medical abortion in early first trimester  7 IAB 04/15/22 [redacted]w[redacted]d   F TAB        Birth Comments: Abortion via D&E at 22wks  6 SAB 10/2021 [redacted]w[redacted]d         5 SAB 01/13/21 [redacted]w[redacted]d   U      4 Term 02/21/18 [redacted]w[redacted]d  5 lb 14.5 oz (2.68 kg) F CS-LTranv Spinal  LIV     Name: [facility] Laura Lucero     Apgar1: 8  Apgar5: 9  3 Term 03/07/16 [redacted]w[redacted]d  7 lb 7.6 oz (3.39 kg) M CS-LTranv Spinal  LIV     Name: Laura Lucero     Apgar1: 7  Apgar5: 9  2 Term 12/20/13 [redacted]w[redacted]d  7 lb 11 oz (3.487 kg) F CS-LTranv Spinal N LIV     Birth Comments: Pt developed allergic reaction to Ampicillin for GBS; req C/S for NRFHT     Complications: Fetal Intolerance     Name: Laura Lucero  1 Term 06/05/11 [redacted]w[redacted]d  7 lb 15 oz (3.6 kg) F Vag-Spont EPI N LIV     Name: Laura Lucero    Obstetric Comments  G1 - G3: FOB#1  G4: FOB#2  G5: FOB#1  G6 - current: FOB#3   Gynecologic History:  Last pap smear was 01/04/21.  Results were Normal.  Reports h/o abnormal pap smears in the past. 11/01/17 positive HPV STI Hx: denies Contraception prior to conception:  none  Past Medical History:  Diagnosis Date   Anemia    Morbid obesity (HCC)    Nausea    Supervision of other normal pregnancy, antepartum 04/27/2022              Clinical Staff    Provider      Office Location     [facility]    Dating     Not found.      Language     English    Anatomy US             Flu Vaccine     offer    Genetic Screen     NIPS:       TDaP vaccine      offer    Hgb A1C or   GTT    Early :  Third trimester :       Covid    No boosters         LAB RESULTS  Rhogam           Blood Type            Feeding Plan    undecid    Family History  Problem Relation Age of Onset   Healthy Mother    Healthy Father    Healthy Sister    Healthy Sister    Healthy Sister    Healthy Sister    Healthy Maternal Grandmother    Healthy Maternal Grandfather    Healthy Paternal Grandmother    Healthy Paternal Grandfather     Past Surgical History:  Procedure Laterality Date   CESAREAN SECTION  2015   CESAREAN SECTION N/A 03/07/2016   Procedure: CESAREAN SECTION;  Surgeon: Laura Pester, MD;  Location: [facility] ORS;  Service: Obstetrics;  Laterality: N/A;   CESAREAN SECTION N/A 02/21/2018   Procedure: CESAREAN SECTION;  Surgeon: Heron Lord, MD;  Location: [facility] ORS;  Service: Obstetrics;  Laterality: N/A;  Time of Birth: 09:09 Sex: Female Weight:    CYST REMOVAL NECK      Social History   Socioeconomic History   Marital status: Single    Spouse name: Not on file   Number of children: 4   Years of education: 12   Highest education level: Not on file  Occupational History   Occupation: CNA  Tobacco Use   Smoking status: Never   Smokeless tobacco: Never  Vaping Use   Vaping status: Former   Substances: Nicotine, Flavoring  Substance and Sexual Activity   Alcohol use: Not Currently    Alcohol/week: 1.0 standard drink of alcohol    Types: 1 Shots of liquor per week   Drug use: Not Currently    Types: Marijuana    Comment: once a month   Sexual  activity: Yes    Partners: Male    Birth control/protection: None  Other Topics Concern   Not on file  Social History Narrative   Not on file   Social Drivers of Health   Financial Resource Strain: Low Risk  (10/09/2023)   Overall Financial Resource Strain (CARDIA)    Difficulty of Paying Living Expenses: Not hard at all  Food Insecurity: No Food Insecurity (10/09/2023)   Hunger Vital Sign    Worried About Running Out of Food in the Last Year: Never true    Ran Out of Food in the Last Year: Never true  Transportation Needs: No Transportation Needs (10/09/2023)   PRAPARE - Administrator, Civil Service (Medical): No    Lack of Transportation (Non-Medical): No  Physical Activity: Inactive (10/09/2023)   Exercise Vital Sign    Days of Exercise per Week: 0 days    Minutes of Exercise per Session: 0 min  Stress: No Stress Concern Present (10/09/2023)   [employer] of Occupational Health - Occupational Stress Questionnaire    Feeling of Stress : Not at all  Social Connections: Socially Isolated (10/09/2023)   Social Connection and Isolation Panel [NHANES]    Frequency of Communication with Friends and Family: Once a week    Frequency of Social Gatherings with Friends and Family: Never    Attends Religious Services: Never    Database administrator or Organizations: No    Attends Banker Meetings: Never    Marital Status: Never married  Intimate Partner Violence: Not At Risk (10/09/2023)   Humiliation, Afraid, Rape, and Kick questionnaire    Fear of Current or Ex-Partner: No    Emotionally  Abused: No    Physically Abused: No    Sexually Abused: No    Current Outpatient Medications on File Prior to Visit  Medication Sig Dispense Refill   nitrofurantoin , macrocrystal-monohydrate, (MACROBID ) 100 MG capsule Take 1 capsule (100 mg total) by mouth 2 (two) times daily. 14 capsule 0   Prenatal Vit-Fe Fumarate-FA (PRENATAL PO) Take by mouth. Gummy vitamins once a  day.     [DISCONTINUED] medroxyPROGESTERone  Acetate 150 MG/ML SUSY INJECT ONE ML (150MG  TOTAL) INTO THE MUSCLE EVERY 3 MONTHS. 1 mL 0   [DISCONTINUED] phentermine  (ADIPEX-P ) 37.5 MG tablet Take 1 tablet (37.5 mg total) by mouth daily before breakfast. 30 tablet 0   No current facility-administered medications on file prior to visit.    Allergies  Allergen Reactions   Ampicillin Anaphylaxis    Has patient had a PCN reaction causing immediate rash, facial/tongue/throat swelling, SOB or lightheadedness with hypotension: Yes Has patient had a PCN reaction causing severe rash involving mucus membranes or skin necrosis: No Has patient had a PCN reaction that required hospitalization: No Has patient had a PCN reaction occurring within the last 10 years: Yes If all of the above answers are "NO", then may proceed with Cephalosporin use. Throat swelling   Penicillins Anaphylaxis   Review of Systems General: Not Present- Fever, Weight Loss and Weight Gain. Skin: Not Present- Rash. HEENT: Not Present- Blurred Vision, Headache and Bleeding Gums. Respiratory: Not Present- Difficulty Breathing. Breast: Not Present- Breast Mass. Cardiovascular: Not Present- Chest Pain, Elevated Blood Pressure, Fainting / Blacking Out and Shortness of Breath. Gastrointestinal: Not Present- Abdominal Pain, Constipation, Nausea and Vomiting. Female Genitourinary: Not Present- Frequency, Painful Urination, Pelvic Pain, Vaginal Bleeding, Vaginal Discharge, Contractions, regular, Fetal Movements Decreased, Urinary Complaints and Vaginal Fluid. Musculoskeletal: Not Present- Back Pain and Leg Cramps. Neurological: Not Present- Dizziness. Psychiatric: Not Present- Depression.   Objective:   Blood pressure 95/62, pulse 82, weight 232 lb (105.2 kg), last menstrual period 08/05/2023, unknown if currently breastfeeding.  Body mass index is 41.1 kg/m.  General Appearance:    Alert, cooperative, no distress, appears stated age   Head:    Normocephalic, without obvious abnormality, atraumatic  Eyes:    PERRL, conjunctiva/corneas clear, EOM's intact, both eyes  Ears:    Normal external ear canals, both ears  Nose:   Nares normal, septum midline, mucosa normal, no drainage or sinus tenderness  Throat:   Lips, mucosa, and tongue normal; teeth and gums normal  Neck:   Supple, symmetrical, trachea midline, no adenopathy; thyroid: no enlargement/tenderness/nodules; no carotid bruit or JVD  Back:     Symmetric, no curvature, ROM normal, no CVA tenderness  Lungs:     Clear to auscultation bilaterally, respirations unlabored  Chest Wall:    No tenderness or deformity   Heart:    Regular rate and rhythm, S1 and S2 normal, no murmur, rub or gallop  Breast Exam:    No tenderness, masses, or nipple abnormality  Abdomen:     Soft, non-tender, bowel sounds active all four quadrants, no masses, no organomegaly.  FHT 150s  bpm.  Genitalia:    Pelvic:external genitalia normal, vagina without lesions, discharge, or tenderness, rectovaginal septum  normal. Cervix normal in appearance, no cervical motion tenderness, no adnexal masses or tenderness.  Pregnancy positive findings: uterine enlargement: 12 wk size, nontender.   Rectal:    Normal external sphincter.  No hemorrhoids appreciated. Internal exam not done.   Extremities:   Extremities normal, atraumatic, no cyanosis or edema  Pulses:   2+ and symmetric all extremities  Skin:   Skin color, texture, turgor normal, no rashes or lesions  Lymph nodes:   Cervical, supraclavicular, and axillary nodes normal  Neurologic:   CNII-XII intact, normal strength, sensation and reflexes throughout     Assessment:   1. Initial obstetric visit in first trimester   2. Supervision of high risk pregnancy, antepartum   3. History of cesarean delivery, currently pregnant   4. Grand multiparity with current pregnancy, antepartum   5. Obesity affecting pregnancy, antepartum, unspecified obesity type    6. Routine screening for STI (sexually transmitted infection)   7. History of UTI   8. Diabetes mellitus screening     Plan:   Supervision of high risk pregnancy  - Initial labs reviewed & discussed. Cervical swab done for GC/CT. - Prenatal vitamins encouraged. - Problem list reviewed and updated. - New OB counseling:  The patient has been given an overview regarding routine prenatal care.  Recommendations regarding diet, weight gain, and exercise in pregnancy were given. - Prenatal testing, optional genetic testing, and ultrasound use in pregnancy were reviewed.  Traditional genetic screening vs cell-fee DNA genetic screening discussed, including risks and benefits. Testing results reviewed today, excited to be having another boy. - Benefits of Breast Feeding were discussed. The patient is encouraged to consider nursing her baby post partum.  2. Prior CD x 3 -Aware recommended delivery mode is via repeat cesarean  3. Grand multiparity -Start daily ASA today, Rx sent  4. BMI: 41 today -Early 1hGTT ordered, complete around 14wks -ASA as above -Weight gain goal: 11-20lbs total -Plan for Q4wk growth US  at 24wks and weekly NSTs at 34w -Anesthesia consult if BMI => 45  5. UTI on prenatal culture s/p Macrobid  -Repeat Urine culture sent today   Follow up in 4 weeks.    Sofia Dunn, DO [facility]

## 2023-11-02 ENCOUNTER — Ambulatory Visit (INDEPENDENT_AMBULATORY_CARE_PROVIDER_SITE_OTHER)

## 2023-11-02 DIAGNOSIS — Z3A12 12 weeks gestation of pregnancy: Secondary | ICD-10-CM | POA: Diagnosis not present

## 2023-11-02 DIAGNOSIS — O359XX0 Maternal care for (suspected) fetal abnormality and damage, unspecified, not applicable or unspecified: Secondary | ICD-10-CM

## 2023-11-02 DIAGNOSIS — O208 Other hemorrhage in early pregnancy: Secondary | ICD-10-CM

## 2023-11-03 LAB — CERVICOVAGINAL ANCILLARY ONLY
Chlamydia: NEGATIVE
Comment: NEGATIVE
Comment: NORMAL
Neisseria Gonorrhea: NEGATIVE

## 2023-11-05 LAB — URINE CULTURE

## 2023-11-06 ENCOUNTER — Encounter: Payer: Self-pay | Admitting: Obstetrics

## 2023-11-06 ENCOUNTER — Other Ambulatory Visit: Payer: Self-pay | Admitting: Obstetrics

## 2023-11-06 MED ORDER — CEPHALEXIN 500 MG PO CAPS: 500.0000 mg | ORAL_CAPSULE | Freq: Two times a day (BID) | ORAL | 0 refills | Status: DC

## 2023-11-06 NOTE — Progress Notes (Signed)
 Bacteriuria on prenatal urine culture, Rx for Keflex

## 2023-11-07 ENCOUNTER — Encounter: Payer: Self-pay | Admitting: Obstetrics and Gynecology

## 2023-11-16 ENCOUNTER — Other Ambulatory Visit

## 2023-11-16 DIAGNOSIS — Z131 Encounter for screening for diabetes mellitus: Secondary | ICD-10-CM

## 2023-11-17 LAB — GLUCOSE, 1 HOUR GESTATIONAL: Gestational Diabetes Screen: 80 mg/dL (ref 70–139)

## 2023-11-20 ENCOUNTER — Encounter: Payer: Self-pay | Admitting: Obstetrics

## 2023-11-29 ENCOUNTER — Ambulatory Visit (INDEPENDENT_AMBULATORY_CARE_PROVIDER_SITE_OTHER): Admitting: Obstetrics

## 2023-11-29 VITALS — BP 100/60 | HR 78 | Wt 237.0 lb

## 2023-11-29 DIAGNOSIS — O34219 Maternal care for unspecified type scar from previous cesarean delivery: Secondary | ICD-10-CM | POA: Diagnosis not present

## 2023-11-29 DIAGNOSIS — Z87440 Personal history of urinary (tract) infections: Secondary | ICD-10-CM

## 2023-11-29 DIAGNOSIS — Z6841 Body Mass Index (BMI) 40.0 and over, adult: Secondary | ICD-10-CM

## 2023-11-29 DIAGNOSIS — Z3689 Encounter for other specified antenatal screening: Secondary | ICD-10-CM

## 2023-11-29 DIAGNOSIS — O0942 Supervision of pregnancy with grand multiparity, second trimester: Secondary | ICD-10-CM

## 2023-11-29 DIAGNOSIS — O283 Abnormal ultrasonic finding on antenatal screening of mother: Secondary | ICD-10-CM | POA: Insufficient documentation

## 2023-11-29 DIAGNOSIS — O0990 Supervision of high risk pregnancy, unspecified, unspecified trimester: Secondary | ICD-10-CM

## 2023-11-29 DIAGNOSIS — O0940 Supervision of pregnancy with grand multiparity, unspecified trimester: Secondary | ICD-10-CM

## 2023-11-29 DIAGNOSIS — Z3A16 16 weeks gestation of pregnancy: Secondary | ICD-10-CM | POA: Diagnosis not present

## 2023-11-29 NOTE — Progress Notes (Signed)
    Return Prenatal Note   Subjective  33 y.o. [id number] at [redacted]w[redacted]d presents for this follow-up prenatal visit. Pregnancy notable for hx prior CD x 3, elevated BMI, rubella non-immune, and fetal umbilical cyst on early US .  Patient no concerns today. Burping a lot, not taking anything.   Patient reports: Movement: Present Contractions: Not present Denies vaginal bleeding or leaking fluid. Objective  Flow sheet Vitals: Pulse Rate: 78 BP: 100/60 Fetal Heart Rate (bpm): 149 Total weight gain: 22 lb (9.979 kg)  General Appearance  No acute distress, well appearing, and well nourished Pulmonary   Normal work of breathing Neurologic   Alert and oriented to person, place, and time Psychiatric   Mood and affect within normal limits   Assessment/Plan   Plan  33 y.o. [id number] at [redacted]w[redacted]d by LMP=10wk US  presents for follow-up OB visit. Reviewed prenatal record including previous visit note.  1. Supervision of high risk pregnancy, antepartum (Primary) -Anatomy US  ordered today for 20-22wks -Can take Tums prn for the belching/acid reflux  2. History of cesarean delivery, currently pregnant -Planning for repeat  3. BMI 40.0-44.9, adult (HCC) -Cautioned on weight gain, is 22# up today -Anesthesia consult if BMI >=45 -Continue ASA  4. Grand multiparity with current pregnancy, antepartum  5. History of UTI - Repeat urine culture/TOC today  6. Abnormal fetal ultrasound - umbilical cyst - Umbilical cyst & [facility] seen on prior US , follow up during anatomy US    Orders Placed This Encounter  Procedures   Urine Culture   US  OB Comp + 14 Wk    Standing Status:   Future    Expected Date:   12/27/2023    Expiration Date:   12/30/2023    Reason for Exam (SYMPTOM  OR DIAGNOSIS REQUIRED):   Needs anatomy u/s; f/u on umbilical cyst? and [facility]    Preferred Imaging Location?:   Internal             at [facility]   Return in about 4 weeks (around 12/27/2023) for ROB and anatomy scan .   Future  Appointments  Date Time Provider Department Center  12/28/2023 10:15 AM Forestine Igo, CNM [facility] None    For next visit:  continue with routine prenatal care    Sofia Dunn, DO [facility]

## 2023-12-01 LAB — URINE CULTURE

## 2023-12-25 ENCOUNTER — Telehealth: Payer: Self-pay

## 2023-12-25 NOTE — Telephone Encounter (Signed)
 Question, asked if she can have HEP B vaccine while pregnate per Alise Appl CNM she was ok to get

## 2023-12-27 ENCOUNTER — Ambulatory Visit
Admission: RE | Admit: 2023-12-27 | Discharge: 2023-12-27 | Disposition: A | Discharge status: Home / Self Care | Source: Ambulatory Visit | Attending: Obstetrics | Admitting: Obstetrics

## 2023-12-27 DIAGNOSIS — O0990 Supervision of high risk pregnancy, unspecified, unspecified trimester: Secondary | ICD-10-CM | POA: Insufficient documentation

## 2023-12-27 DIAGNOSIS — O283 Abnormal ultrasonic finding on antenatal screening of mother: Secondary | ICD-10-CM | POA: Diagnosis present

## 2023-12-27 DIAGNOSIS — Z3689 Encounter for other specified antenatal screening: Secondary | ICD-10-CM | POA: Insufficient documentation

## 2023-12-28 ENCOUNTER — Telehealth: Payer: Self-pay | Admitting: Certified Nurse Midwife

## 2023-12-28 ENCOUNTER — Encounter: Admitting: Certified Nurse Midwife

## 2023-12-28 DIAGNOSIS — O0990 Supervision of high risk pregnancy, unspecified, unspecified trimester: Secondary | ICD-10-CM

## 2023-12-28 DIAGNOSIS — Z3A20 20 weeks gestation of pregnancy: Secondary | ICD-10-CM

## 2023-12-28 NOTE — Telephone Encounter (Signed)
 Reached out to pt to reschedule ROB appt that was scheduled on 12/28/2023 at 10:15 with J. Irine Manning.  Was able to reschedule the appt for 01/01/2024 at 3:15 with Missy.

## 2024-01-01 ENCOUNTER — Encounter: Payer: Self-pay | Admitting: Obstetrics

## 2024-01-01 ENCOUNTER — Ambulatory Visit (INDEPENDENT_AMBULATORY_CARE_PROVIDER_SITE_OTHER): Admitting: Obstetrics

## 2024-01-01 VITALS — BP 101/70 | HR 96 | Wt 242.0 lb

## 2024-01-01 DIAGNOSIS — Z3A21 21 weeks gestation of pregnancy: Secondary | ICD-10-CM | POA: Diagnosis not present

## 2024-01-01 DIAGNOSIS — O0940 Supervision of pregnancy with grand multiparity, unspecified trimester: Secondary | ICD-10-CM

## 2024-01-01 DIAGNOSIS — O99210 Obesity complicating pregnancy, unspecified trimester: Secondary | ICD-10-CM

## 2024-01-01 DIAGNOSIS — O34219 Maternal care for unspecified type scar from previous cesarean delivery: Secondary | ICD-10-CM

## 2024-01-01 DIAGNOSIS — O0990 Supervision of high risk pregnancy, unspecified, unspecified trimester: Secondary | ICD-10-CM

## 2024-01-01 NOTE — Assessment & Plan Note (Signed)
-  Discussed recommendation for repeat CS d/t h/o 3 prior CS -Will schedule visit with MD at 28 weeks for delivery planning

## 2024-01-01 NOTE — Progress Notes (Signed)
    Return Prenatal Note   Assessment/Plan   Plan  33 y.o. [id number] at [redacted]w[redacted]d presents for follow-up OB visit. Reviewed prenatal record including previous visit note.  History of cesarean delivery, currently pregnant -Discussed recommendation for repeat CS d/t h/o 3 prior CS -Will schedule visit with MD at 28 weeks for delivery planning  Supervision of high risk pregnancy, antepartum -US  report has not yet resulted -Anticipatory guidance about late second trimester -Discussed comfort measures for pelvic pain -Recommend cutting out food triggers and trying omeprazole if needed for GERD -Reviewed preterm labor warning signs. Instructed to call office or come to hospital with persistent headache, vision changes, regular contractions, leaking of fluid, or vaginal bleeding.     No orders of the defined types were placed in this encounter.  No follow-ups on file.   Future Appointments  Date Time Provider Department Center  01/29/2024  3:35 PM Lucero Laura HERO, CNM [facility] None    For next visit:  Routine prenatal care    Subjective   Laura Lucero has been feeling some flutters, no strong fetal movements yet. She is wondering if TOLAC is an option after 3 CS (h/o one vaginal birth).  Movement: Present Contractions: Not present  Objective   Flow sheet Vitals: Pulse Rate: 96 BP: 101/70 Fundal Height: 22 cm Fetal Heart Rate (bpm): 162 Total weight gain: 27 lb (12.2 kg)  General Appearance  No acute distress, well appearing, and well nourished Pulmonary   Normal work of breathing Neurologic   Alert and oriented to person, place, and time Psychiatric   Mood and affect within normal limits  Laura Lucero, CNM 01/01/24 3:54 PM

## 2024-01-01 NOTE — Assessment & Plan Note (Signed)
-  US  report has not yet resulted -Anticipatory guidance about late second trimester -Discussed comfort measures for pelvic pain -Recommend cutting out food triggers and trying omeprazole if needed for GERD -Reviewed preterm labor warning signs. Instructed to call office or come to hospital with persistent headache, vision changes, regular contractions, leaking of fluid, or vaginal bleeding.

## 2024-01-09 ENCOUNTER — Other Ambulatory Visit

## 2024-01-09 ENCOUNTER — Telehealth: Payer: Self-pay

## 2024-01-09 NOTE — Telephone Encounter (Signed)
 Patient inquiring if she can take Pepto Bismol for diarrhea. Advised of safe meds: Kao-Pectate, Imodium AD. Safe meds list sent via my chart.

## 2024-01-11 ENCOUNTER — Other Ambulatory Visit

## 2024-01-11 ENCOUNTER — Other Ambulatory Visit: Payer: Self-pay

## 2024-01-11 ENCOUNTER — Encounter (HOSPITAL_COMMUNITY): Payer: Self-pay | Admitting: Family Medicine

## 2024-01-11 ENCOUNTER — Inpatient Hospital Stay (HOSPITAL_COMMUNITY)
Admission: AD | Admit: 2024-01-11 | Discharge: 2024-01-11 | Disposition: A | Discharge status: Home / Self Care | Attending: Family Medicine | Admitting: Family Medicine

## 2024-01-11 DIAGNOSIS — R42 Dizziness and giddiness: Secondary | ICD-10-CM | POA: Insufficient documentation

## 2024-01-11 DIAGNOSIS — O26892 Other specified pregnancy related conditions, second trimester: Secondary | ICD-10-CM | POA: Insufficient documentation

## 2024-01-11 DIAGNOSIS — O212 Late vomiting of pregnancy: Secondary | ICD-10-CM | POA: Diagnosis present

## 2024-01-11 DIAGNOSIS — Z3A22 22 weeks gestation of pregnancy: Secondary | ICD-10-CM

## 2024-01-11 LAB — URINALYSIS, ROUTINE W REFLEX MICROSCOPIC
Bilirubin Urine: NEGATIVE
Glucose, UA: NEGATIVE mg/dL
Hgb urine dipstick: NEGATIVE
Ketones, ur: NEGATIVE mg/dL
Nitrite: NEGATIVE
Protein, ur: NEGATIVE mg/dL
Specific Gravity, Urine: 1.025 (ref 1.005–1.030)
pH: 5 (ref 5.0–8.0)

## 2024-01-11 MED ORDER — MECLIZINE HCL 25 MG PO TABS
25.0000 mg | ORAL_TABLET | Freq: Once | ORAL | Status: AC
  Administered 2024-01-11: 25 mg via ORAL
  Filled 2024-01-11: qty 1

## 2024-01-11 MED ORDER — MECLIZINE HCL 50 MG PO TABS: 25.0000 mg | ORAL_TABLET | Freq: Three times a day (TID) | ORAL | 0 refills | Status: DC | PRN

## 2024-01-11 NOTE — MAU Provider Note (Addendum)
 Chief Complaint:  No chief complaint on file.   Event Date/Time   First Provider Initiated Contact with Patient 01/11/24 0732     HPI: Laura Lucero is a 33 y.o. [id number] at 30w5dwho presents to maternity admissions reporting onset of dizziness this morning which woke her up.  It is a sensation of the room spinning. Makes her nauseated at times.  Has never had this happen before.  No congestion, fever or cough.  No headache. . She reports good fetal movement, denies LOF, vaginal bleeding, h/a, diarrhea, constipation or fever/chills.  Other This is a new problem. The current episode started today. The problem has been unchanged. Pertinent negatives include no abdominal pain, chest pain, chills, congestion or coughing. The symptoms are aggravated by standing and walking. She has tried nothing for the symptoms.   RN Note: .Laura Lucero is a 33 y.o. at [redacted]w[redacted]d here in MAU reporting: dizziness that started an hour ago and its makes her vomit. Has vomited 3 times since the dizziness started. Denies pain, vaginal bleeding or leaking of fluid. +FM  Pain score: 0/10  Past Medical History: Past Medical History:  Diagnosis Date   Anemia    Morbid obesity (HCC)    Nausea    Supervision of other normal pregnancy, antepartum 04/27/2022              Clinical Staff    Provider      Office Location      [facility]    Dating     Not found.      Language     English    Anatomy US             Flu Vaccine     offer    Genetic Screen     NIPS:       TDaP vaccine      offer    Hgb A1C or   GTT    Early :  Third trimester :       Covid    No boosters         LAB RESULTS       Rhogam           Blood Type            Feeding Plan    undecid    Past obstetric history: OB History  Gravida Para Term Preterm AB Living  9 4 4  0 4 4  SAB IAB Ectopic Multiple Live Births  2 2 0 0 4    # Outcome Date GA Lbr Len/2nd Weight Sex Type Anes PTL Lv  9 Current           8 IAB 10/2022 [redacted]w[redacted]d    TAB        Birth Comments:  Medical abortion in early first trimester  7 IAB 04/15/22 [redacted]w[redacted]d   F TAB        Birth Comments: Abortion via D&E at 22wks  6 SAB 10/2021 [redacted]w[redacted]d         5 SAB 01/13/21 [redacted]w[redacted]d   U      4 Term 02/21/18 [redacted]w[redacted]d  2680 g F CS-LTranv Spinal  LIV  3 Term 03/07/16 [redacted]w[redacted]d  3390 g M CS-LTranv Spinal  LIV  2 Term 12/20/13 [redacted]w[redacted]d  3487 g F CS-LTranv Spinal N LIV     Birth Comments: Pt developed allergic reaction to Ampicillin for GBS; req C/S for NRFHT     Complications: Fetal Intolerance  1 Term 06/05/11 [redacted]w[redacted]d  3600 g F Vag-Spont EPI N LIV    Obstetric Comments  G1 - G3: FOB#1  G4: FOB#2  G5: FOB#1  G6 - current: FOB#3    Past Surgical History: Past Surgical History:  Procedure Laterality Date   CESAREAN SECTION  12/20/2013   CESAREAN SECTION N/A 03/07/2016   Procedure: CESAREAN SECTION;  Surgeon: Garnette JONETTA Mace, MD;  Location: [facility] ORS;  Service: Obstetrics;  Laterality: N/A;   CESAREAN SECTION N/A 02/21/2018   Procedure: CESAREAN SECTION;  Surgeon: Victor Claudell SAUNDERS, MD;  Location: [facility] ORS;  Service: Obstetrics;  Laterality: N/A;  Time of Birth: 09:09 Sex: Female Weight:    CYST REMOVAL NECK      Family History: Family History  Problem Relation Age of Onset   Healthy Mother    Healthy Father    Healthy Sister    Healthy Sister    Healthy Sister    Healthy Sister    Healthy Maternal Grandmother    Healthy Maternal Grandfather    Healthy Paternal Grandmother    Healthy Paternal Grandfather     Social History: Social History   Tobacco Use   Smoking status: Never   Smokeless tobacco: Never  Vaping Use   Vaping status: Former   Substances: Nicotine, Flavoring  Substance Use Topics   Alcohol use: Not Currently    Alcohol/week: 1.0 standard drink of alcohol    Types: 1 Shots of liquor per week   Drug use: Not Currently    Types: Marijuana    Comment: once a month    Allergies:  Allergies  Allergen Reactions   Ampicillin Anaphylaxis    Has patient had a PCN reaction  causing immediate rash, facial/tongue/throat swelling, SOB or lightheadedness with hypotension: Yes Has patient had a PCN reaction causing severe rash involving mucus membranes or skin necrosis: No Has patient had a PCN reaction that required hospitalization: No Has patient had a PCN reaction occurring within the last 10 years: Yes If all of the above answers are NO, then may proceed with Cephalosporin use. Throat swelling   Penicillins Anaphylaxis    Meds:  Medications Prior to Admission  Medication Sig Dispense Refill Last Dose/Taking   Prenatal Vit-Fe Fumarate-FA (PRENATAL PO) Take by mouth. Gummy vitamins once a day.   01/10/2024   aspirin  EC 81 MG tablet Take 1 tablet (81 mg total) by mouth daily. for prevention of hypertension/preeclampsia later in pregnancy (Patient not taking: Reported on 01/11/2024) 90 tablet 2 Not Taking   cephALEXin  (KEFLEX ) 500 MG capsule Take 1 capsule (500 mg total) by mouth 2 (two) times daily. (Patient not taking: Reported on 11/29/2023) 14 capsule 0    nitrofurantoin , macrocrystal-monohydrate, (MACROBID ) 100 MG capsule Take 1 capsule (100 mg total) by mouth 2 (two) times daily. (Patient not taking: Reported on 11/29/2023) 14 capsule 0    ondansetron  (ZOFRAN -ODT) 4 MG disintegrating tablet Take 1 tablet (4 mg total) by mouth every 8 (eight) hours as needed for nausea or vomiting. (Patient not taking: Reported on 11/29/2023) 30 tablet 2     I have reviewed patient's Past Medical Hx, Surgical Hx, Family Hx, Social Hx, medications and allergies.   ROS:  Review of Systems  Constitutional:  Negative for chills.  HENT:  Negative for congestion.   Respiratory:  Negative for cough.   Cardiovascular:  Negative for chest pain.  Gastrointestinal:  Negative for abdominal pain.   Other systems negative  Physical Exam  Patient Vitals for the past 24 hrs:  BP Temp  Temp src Pulse Resp SpO2 Height Weight  01/11/24 0658 115/65 98.6 F (37 C) Oral 87 18 100 % 5' 3 (1.6 m)  108.9 kg   Constitutional: Well-developed, well-nourished female in no acute distress.  Cardiovascular: normal rate and rhythm Respiratory: normal effort, clear to auscultation bilaterally GI: Abd soft, non-tender, gravid appropriate for gestational age.   No rebound or guarding. MS: Extremities nontender, no edema, normal ROM Neurologic: Alert and oriented x 4.   FHT:  160   Labs: No results found for this or any previous visit (from the past 24 hours). A/Positive/-- (04/03 1012)  Imaging:    MAU Course/MDM: I have reviewed the triage vital signs and the nursing notes.   Pertinent labs & imaging results that were available during my care of the patient were reviewed by me and considered in my medical decision making (see chart for details).      I have reviewed her medical records including past results, notes and treatments.   Treatments in MAU included Meclizine ordered for vertigo.    Assessment: SIn[place] IUP at [redacted]w[redacted]d Probable Vertigo  Plan: Meclizine Care handed over to Dr Jhonny  Earnie Pouch CNM, MSN Certified Nurse-Midwife 01/11/2024 7:32 AM  Assumed care pending improvement with Meclizine.   Patient feeling much better after Meclizine.  Request prescription.  No other questions / concerns.  Strict/ usual return precautions.    Augustin Jhonny, MD Family Medicine - Obstetrics Fellow

## 2024-01-11 NOTE — MAU Note (Signed)
..  Laura Lucero is a 33 y.o. at [redacted]w[redacted]d here in MAU reporting: dizziness that started an hour ago and its makes her vomit. Has vomited 3 times since the dizziness started. Denies pain, vaginal bleeding or leaking of fluid. +FM  Pain score: 0/10 Vitals:   01/11/24 0658  BP: 115/65  Pulse: 87  Resp: 18  Temp: 98.6 F (37 C)  SpO2: 100%     FHT:160 Lab orders placed from triage:  UA

## 2024-01-15 NOTE — Progress Notes (Signed)
 Here for ppd reading- reported as neg

## 2024-01-29 ENCOUNTER — Ambulatory Visit (INDEPENDENT_AMBULATORY_CARE_PROVIDER_SITE_OTHER): Admitting: Obstetrics

## 2024-01-29 ENCOUNTER — Encounter: Payer: Self-pay | Admitting: Obstetrics

## 2024-01-29 VITALS — BP 105/70 | HR 92 | Wt 245.0 lb

## 2024-01-29 DIAGNOSIS — Z3A25 25 weeks gestation of pregnancy: Secondary | ICD-10-CM | POA: Diagnosis not present

## 2024-01-29 DIAGNOSIS — O34219 Maternal care for unspecified type scar from previous cesarean delivery: Secondary | ICD-10-CM

## 2024-01-29 DIAGNOSIS — Z113 Encounter for screening for infections with a predominantly sexual mode of transmission: Secondary | ICD-10-CM

## 2024-01-29 DIAGNOSIS — O0992 Supervision of high risk pregnancy, unspecified, second trimester: Secondary | ICD-10-CM | POA: Diagnosis not present

## 2024-01-29 DIAGNOSIS — Z131 Encounter for screening for diabetes mellitus: Secondary | ICD-10-CM

## 2024-01-29 DIAGNOSIS — D649 Anemia, unspecified: Secondary | ICD-10-CM

## 2024-01-29 DIAGNOSIS — O0990 Supervision of high risk pregnancy, unspecified, unspecified trimester: Secondary | ICD-10-CM

## 2024-01-29 NOTE — Assessment & Plan Note (Signed)
-  H/o 3 prior CS -Next visit with MD to schedule CS -Considering transfer to [place]. Encouraged to transfer in time to schedule CS with new provider if that is her plan

## 2024-01-29 NOTE — Assessment & Plan Note (Signed)
-  Discussed 28-week labs at next visit. Handout given. -Anticipatory guidance about late 2nd/early 3rd trimester and fetal development -Reviewed kick counts and preterm labor warning signs. Instructed to call office or come to hospital with persistent headache, vision changes, regular contractions, leaking of fluid, decreased fetal movement or vaginal bleeding.

## 2024-01-29 NOTE — Progress Notes (Signed)
    Return Prenatal Note   Assessment/Plan   Plan  33 y.o. H0E5955 at [redacted]w[redacted]d presents for follow-up OB visit. Reviewed prenatal record including previous visit note.  History of cesarean delivery, currently pregnant -H/o 3 prior CS -Next visit with MD to schedule CS -Considering transfer to [place]. Encouraged to transfer in time to schedule CS with new provider if that is her plan  Supervision of high risk pregnancy, antepartum -Discussed 28-week labs at next visit. Handout given. -Anticipatory guidance about late 2nd/early 3rd trimester and fetal development -Reviewed kick counts and preterm labor warning signs. Instructed to call office or come to hospital with persistent headache, vision changes, regular contractions, leaking of fluid, decreased fetal movement or vaginal bleeding.     No orders of the defined types were placed in this encounter.  Return in about 4 weeks (around 02/26/2024).   Future Appointments  Date Time Provider Department Center  01/29/2024  3:35 PM Lucero Laura HERO, CNM [facility] None  02/27/2024  9:00 AM [facility] None  02/27/2024  9:55 AM Leigh Sober, MD [facility] None    For next visit:  ROB with 28-week labs and TDaP    Subjective   Laura Lucero is tired but otherwise well. She is living in [place] and considering transferring her care there to be closer to home.  Movement: Present Contractions: Not present  Objective   Flow sheet Vitals: Pulse Rate: 92 BP: 105/70 Fundal Height: 24 cm Fetal Heart Rate (bpm): 148 Total weight gain: 30 lb (13.6 kg)  General Appearance  No acute distress, well appearing, and well nourished Pulmonary   Normal work of breathing Neurologic   Alert and oriented to person, place, and time Psychiatric   Mood and affect within normal limits  Laura Lucero, CNM 01/29/24 3:31 PM

## 2024-02-26 NOTE — Telephone Encounter (Signed)
 Patient calling triage with concerns of swelling in hands. Has noticed it happens overnight. Denies headaches, spots in vision/blurred vision. Has an appointment tomorrow 8/18 with Dr. Leigh. I advised swelling is normal in pregnancy, but to look out for these symptoms.

## 2024-02-27 ENCOUNTER — Other Ambulatory Visit

## 2024-02-27 ENCOUNTER — Ambulatory Visit: Admitting: Obstetrics

## 2024-02-27 VITALS — BP 109/72 | HR 89 | Wt 249.1 lb

## 2024-02-27 DIAGNOSIS — O34219 Maternal care for unspecified type scar from previous cesarean delivery: Secondary | ICD-10-CM | POA: Diagnosis not present

## 2024-02-27 DIAGNOSIS — Z23 Encounter for immunization: Secondary | ICD-10-CM

## 2024-02-27 DIAGNOSIS — O99213 Obesity complicating pregnancy, third trimester: Secondary | ICD-10-CM

## 2024-02-27 DIAGNOSIS — O0993 Supervision of high risk pregnancy, unspecified, third trimester: Secondary | ICD-10-CM | POA: Diagnosis not present

## 2024-02-27 DIAGNOSIS — O99210 Obesity complicating pregnancy, unspecified trimester: Secondary | ICD-10-CM

## 2024-02-27 DIAGNOSIS — Z3A29 29 weeks gestation of pregnancy: Secondary | ICD-10-CM

## 2024-02-27 DIAGNOSIS — O0990 Supervision of high risk pregnancy, unspecified, unspecified trimester: Secondary | ICD-10-CM

## 2024-02-27 DIAGNOSIS — E669 Obesity, unspecified: Secondary | ICD-10-CM

## 2024-02-27 NOTE — Progress Notes (Signed)
    Return Prenatal Note   Subjective  33 y.o. H0E5955 at [redacted]w[redacted]d presents for this follow-up prenatal visit.   Patient desiring to schedule her repeat c-section.  Patient has a history of 3 past c-sections, the first for fetal intolerance and the others were elective repeats. No longer considering transfer to [place].   Patient reports:  swelling in her hands at night and in the morning.  Denies any facial swelling, vision changes or RUQ pain.  Movement: Present Contractions: Not present Denies vaginal bleeding or leaking fluid. Objective  Flow sheet Vitals: Pulse Rate: 89 BP: 109/72 Fundal Height: 30 cm Fetal Heart Rate (bpm): 138 Total weight gain: 34 lb 1.6 oz (15.5 kg)  General Appearance  No acute distress, well appearing, and well nourished Pulmonary   Normal work of breathing Neurologic   Alert and oriented to person, place, and time Psychiatric   Mood and affect within normal limits   Assessment/Plan   Plan  33 y.o. H0E5955 at [redacted]w[redacted]d LMP=10wk US  presents for follow-up OB visit. Reviewed prenatal record including previous visit note.  1. Supervision of high risk pregnancy, antepartum (Primary) -Tdap, BTC, and 1hGTT/28wk labs completed today -Third trimester anticipatory guidance reviewed; swelling minor today, reviewed home remedies  2. History of cesarean delivery, currently pregnant -Scheduled for rCD 10/27 ([redacted]w[redacted]d) at 730a -Considering a tubal vs post-placental IUD, would like to think about it further and will update us  -Plan for clear drape and ON-Q  3. Obesity affecting pregnancy, antepartum, unspecified obesity type -PG-BMI 38.09, TWG 34#, discussed slowing -Is not taking ASA, counseled on purpose and encouraged use -Growth US  at next visit -Start weekly NSTs at 37wks   Orders Placed This Encounter  Procedures   US  OB Follow Up    Standing Status:   Future    Expected Date:   03/12/2024    Expiration Date:   02/26/2025    Reason for exam::   Growth for BMI     Preferred imaging location?:   Internal   Tdap vaccine greater than or equal to 7yo IM   Return in about 2 weeks (around 03/12/2024) for ROB and growth US .   Future Appointments  Date Time Provider Department Center  03/06/2024  1:00 PM [facility]  3 [facility]  03/12/2024  9:35 AM Dominic, Jinnie Jansky, CNM [facility] None    For next visit:  continue routine prenatal care; growth US  next visit    Estil Mangle, DO [facility]

## 2024-02-28 LAB — 28 WEEK RH+PANEL
Basophils Absolute: 0 x10E3/uL (ref 0.0–0.2)
Basos: 0 %
EOS (ABSOLUTE): 0 x10E3/uL (ref 0.0–0.4)
Eos: 0 %
Gestational Diabetes Screen: 116 mg/dL (ref 70–139)
HIV Screen 4th Generation wRfx: NONREACTIVE
Hematocrit: 31.1 % — ABNORMAL LOW (ref 34.0–46.6)
Hemoglobin: 9.7 g/dL — ABNORMAL LOW (ref 11.1–15.9)
Immature Grans (Abs): 0 x10E3/uL (ref 0.0–0.1)
Immature Granulocytes: 0 %
Lymphocytes Absolute: 1.4 x10E3/uL (ref 0.7–3.1)
Lymphs: 18 %
MCH: 27.3 pg (ref 26.6–33.0)
MCHC: 31.2 g/dL — ABNORMAL LOW (ref 31.5–35.7)
MCV: 88 fL (ref 79–97)
Monocytes Absolute: 0.3 x10E3/uL (ref 0.1–0.9)
Monocytes: 4 %
Neutrophils Absolute: 5.8 x10E3/uL (ref 1.4–7.0)
Neutrophils: 78 %
Platelets: 260 x10E3/uL (ref 150–450)
RBC: 3.55 x10E6/uL — ABNORMAL LOW (ref 3.77–5.28)
RDW: 12.9 % (ref 11.7–15.4)
RPR Ser Ql: NONREACTIVE
WBC: 7.5 x10E3/uL (ref 3.4–10.8)

## 2024-03-06 ENCOUNTER — Ambulatory Visit

## 2024-03-08 ENCOUNTER — Other Ambulatory Visit: Payer: Self-pay | Admitting: Obstetrics

## 2024-03-08 ENCOUNTER — Encounter: Payer: Self-pay | Admitting: Obstetrics

## 2024-03-08 ENCOUNTER — Ambulatory Visit
Admission: RE | Admit: 2024-03-08 | Discharge: 2024-03-08 | Disposition: A | Discharge status: Home / Self Care | Source: Ambulatory Visit | Attending: Obstetrics | Admitting: Obstetrics

## 2024-03-08 DIAGNOSIS — O99210 Obesity complicating pregnancy, unspecified trimester: Secondary | ICD-10-CM | POA: Insufficient documentation

## 2024-03-08 MED ORDER — ACCRUFER 30 MG PO CAPS: 1.0000 | ORAL_CAPSULE | Freq: Two times a day (BID) | ORAL | 6 refills | Status: AC

## 2024-03-12 ENCOUNTER — Encounter: Admitting: Licensed Practical Nurse

## 2024-03-20 ENCOUNTER — Telehealth: Payer: Self-pay

## 2024-03-20 NOTE — Telephone Encounter (Signed)
 Patient calling in with complaints of bilateral foot edema. Denies hypertension, vision changes, headaches. Discussed relief measures of raising feet over heart level, ankle pumps, decreasing salt intake and increasing fluids. Advised to go to ED if swelling worsens or begins in hands and face or she begins to have Pre-E symptoms.

## 2024-03-22 NOTE — Progress Notes (Addendum)
    Return Prenatal Note   Subjective   33 y.o. H0E5955 at [redacted]w[redacted]d presents for this follow-up prenatal visit.  Patient  Patient reports:has noticed swelling in her hands and feet, the swelling in her hands is worse at night-she has tried sleeping with them elevated. Works as a Teacher, English as a foreign language.  -Not taking Iron because is makes her nauseous  -Mood has been moody but ok Movement: Present Contractions: Not present  Objective   Flow sheet Vitals: Pulse Rate: 66 BP: 130/87 Fundal Height: 34 cm Fetal Heart Rate (bpm): 145 Total weight gain: 41 lb 4.8 oz (18.7 kg)  General Appearance  No acute distress, well appearing, and well nourished Pulmonary   Normal work of breathing Neurologic   Alert and oriented to person, place, and time Psychiatric   Mood and affect within normal limits   Assessment/Plan   Plan  33 y.o. H0E5955 at [redacted]w[redacted]d presents for follow-up OB visit. Reviewed prenatal record including previous visit note.  Obesity affecting pregnancy, antepartum -TWG 41lbs, walks a lot for work, eats 2 meals a day, breakfast is a banana and pop tart, dinner is whatever her partner makes-protein, veggie plus a starch. Drinks mostly water and some juice. Encouraged to increase physical activity and make adjustments to her.   Supervision of high risk pregnancy, antepartum -Her partner will be with her for the c-section, she has family to care for her other children and for PP support.  -US  8/29 EFW 65% and AFI 18.5  Anemia in pregnancy -Iron infusion ordered       No orders of the defined types were placed in this encounter.  Return in about 2 weeks (around 04/08/2024) for ROB.   Future Appointments  Date Time Provider Department Center  04/08/2024  8:15 AM Justino [name] HERO, CNM [facility] None  05/02/2024  8:00 AM [facility] None    For next visit:  continue with routine prenatal care     Lucille Crichlow [facility], CNM  09/15/259:58 AM

## 2024-03-25 ENCOUNTER — Encounter: Payer: Self-pay | Admitting: Licensed Practical Nurse

## 2024-03-25 ENCOUNTER — Ambulatory Visit: Admitting: Licensed Practical Nurse

## 2024-03-25 VITALS — BP 130/87 | HR 66 | Wt 256.3 lb

## 2024-03-25 DIAGNOSIS — O99013 Anemia complicating pregnancy, third trimester: Secondary | ICD-10-CM

## 2024-03-25 DIAGNOSIS — D649 Anemia, unspecified: Secondary | ICD-10-CM

## 2024-03-25 DIAGNOSIS — O99213 Obesity complicating pregnancy, third trimester: Secondary | ICD-10-CM

## 2024-03-25 DIAGNOSIS — E669 Obesity, unspecified: Secondary | ICD-10-CM | POA: Diagnosis not present

## 2024-03-25 DIAGNOSIS — O0990 Supervision of high risk pregnancy, unspecified, unspecified trimester: Secondary | ICD-10-CM

## 2024-03-25 DIAGNOSIS — Z3A33 33 weeks gestation of pregnancy: Secondary | ICD-10-CM

## 2024-03-25 NOTE — Assessment & Plan Note (Signed)
 Iron infusion ordered.

## 2024-03-25 NOTE — Assessment & Plan Note (Signed)
-  TWG 41lbs, walks a lot for work, eats 2 meals a day, breakfast is a banana and pop tart, dinner is whatever her partner makes-protein, veggie plus a starch. Drinks mostly water and some juice. Encouraged to increase physical activity and make adjustments to her.

## 2024-03-25 NOTE — Assessment & Plan Note (Signed)
-  Her partner will be with her for the c-section, she has family to care for her other children and for PP support.  -US  8/29 EFW 65% and AFI 18.5

## 2024-04-07 ENCOUNTER — Other Ambulatory Visit: Payer: Self-pay

## 2024-04-07 ENCOUNTER — Inpatient Hospital Stay (HOSPITAL_COMMUNITY)
Admission: AD | Admit: 2024-04-07 | Discharge: 2024-04-07 | Discharge status: Against Medical Advice | Attending: Obstetrics & Gynecology | Admitting: Obstetrics & Gynecology

## 2024-04-07 ENCOUNTER — Encounter: Payer: Self-pay | Admitting: Licensed Practical Nurse

## 2024-04-07 ENCOUNTER — Encounter (HOSPITAL_COMMUNITY): Payer: Self-pay

## 2024-04-07 DIAGNOSIS — O1203 Gestational edema, third trimester: Secondary | ICD-10-CM | POA: Diagnosis not present

## 2024-04-07 DIAGNOSIS — R600 Localized edema: Secondary | ICD-10-CM

## 2024-04-07 DIAGNOSIS — M542 Cervicalgia: Secondary | ICD-10-CM | POA: Insufficient documentation

## 2024-04-07 DIAGNOSIS — O163 Unspecified maternal hypertension, third trimester: Secondary | ICD-10-CM

## 2024-04-07 DIAGNOSIS — R202 Paresthesia of skin: Secondary | ICD-10-CM | POA: Insufficient documentation

## 2024-04-07 DIAGNOSIS — O99891 Other specified diseases and conditions complicating pregnancy: Secondary | ICD-10-CM | POA: Insufficient documentation

## 2024-04-07 DIAGNOSIS — O99353 Diseases of the nervous system complicating pregnancy, third trimester: Secondary | ICD-10-CM | POA: Diagnosis not present

## 2024-04-07 DIAGNOSIS — Z3A35 35 weeks gestation of pregnancy: Secondary | ICD-10-CM | POA: Diagnosis not present

## 2024-04-07 DIAGNOSIS — O133 Gestational [pregnancy-induced] hypertension without significant proteinuria, third trimester: Secondary | ICD-10-CM | POA: Diagnosis not present

## 2024-04-07 LAB — URINALYSIS, ROUTINE W REFLEX MICROSCOPIC
Bilirubin Urine: NEGATIVE
Glucose, UA: NEGATIVE mg/dL
Hgb urine dipstick: NEGATIVE
Ketones, ur: NEGATIVE mg/dL
Leukocytes,Ua: NEGATIVE
Nitrite: NEGATIVE
Protein, ur: 30 mg/dL — AB
Specific Gravity, Urine: 1.017 (ref 1.005–1.030)
pH: 5 (ref 5.0–8.0)

## 2024-04-07 LAB — CBC WITH DIFFERENTIAL/PLATELET
Abs Immature Granulocytes: 0.07 K/uL (ref 0.00–0.07)
Basophils Absolute: 0 K/uL (ref 0.0–0.1)
Basophils Relative: 0 %
Eosinophils Absolute: 0.1 K/uL (ref 0.0–0.5)
Eosinophils Relative: 1 %
HCT: 29.8 % — ABNORMAL LOW (ref 36.0–46.0)
Hemoglobin: 9.3 g/dL — ABNORMAL LOW (ref 12.0–15.0)
Immature Granulocytes: 1 %
Lymphocytes Relative: 26 %
Lymphs Abs: 2.1 K/uL (ref 0.7–4.0)
MCH: 26 pg (ref 26.0–34.0)
MCHC: 31.2 g/dL (ref 30.0–36.0)
MCV: 83.2 fL (ref 80.0–100.0)
Monocytes Absolute: 0.5 K/uL (ref 0.1–1.0)
Monocytes Relative: 6 %
Neutro Abs: 5.4 K/uL (ref 1.7–7.7)
Neutrophils Relative %: 66 %
Platelets: 247 K/uL (ref 150–400)
RBC: 3.58 MIL/uL — ABNORMAL LOW (ref 3.87–5.11)
RDW: 13.8 % (ref 11.5–15.5)
WBC: 8.1 K/uL (ref 4.0–10.5)
nRBC: 0 % (ref 0.0–0.2)

## 2024-04-07 LAB — COMPREHENSIVE METABOLIC PANEL WITH GFR
ALT: 13 U/L (ref 0–44)
AST: 21 U/L (ref 15–41)
Albumin: 2.5 g/dL — ABNORMAL LOW (ref 3.5–5.0)
Alkaline Phosphatase: 114 U/L (ref 38–126)
Anion gap: 11 (ref 5–15)
BUN: 5 mg/dL — ABNORMAL LOW (ref 6–20)
CO2: 19 mmol/L — ABNORMAL LOW (ref 22–32)
Calcium: 8.9 mg/dL (ref 8.9–10.3)
Chloride: 105 mmol/L (ref 98–111)
Creatinine, Ser: 0.62 mg/dL (ref 0.44–1.00)
GFR, Estimated: >60 mL/min (ref >60)
Glucose, Bld: 84 mg/dL (ref 70–99)
Potassium: 3.7 mmol/L (ref 3.5–5.1)
Sodium: 135 mmol/L (ref 135–145)
Total Bilirubin: 0.7 mg/dL (ref 0.0–1.2)
Total Protein: 6.1 g/dL — ABNORMAL LOW (ref 6.5–8.1)

## 2024-04-07 LAB — PROTEIN / CREATININE RATIO, URINE
Creatinine, Urine: 114 mg/dL
Protein Creatinine Ratio: 0.47 mg/mg{creat} — ABNORMAL HIGH (ref 0.00–0.15)
Total Protein, Urine: 54 mg/dL

## 2024-04-07 MED ORDER — LACTATED RINGERS IV SOLN: INTRAVENOUS | Status: DC

## 2024-04-07 MED ORDER — HYDRALAZINE HCL 20 MG/ML IJ SOLN: 10.0000 mg | INTRAMUSCULAR | Status: DC | PRN

## 2024-04-07 MED ORDER — LABETALOL HCL 5 MG/ML IV SOLN: 20.0000 mg | INTRAVENOUS | Status: DC | PRN

## 2024-04-07 MED ORDER — HYDRALAZINE HCL 20 MG/ML IJ SOLN: 5.0000 mg | INTRAMUSCULAR | Status: DC | PRN

## 2024-04-07 MED ORDER — NIFEDIPINE ER OSMOTIC RELEASE 30 MG PO TB24
30.0000 mg | ORAL_TABLET | Freq: Once | ORAL | Status: AC
  Administered 2024-04-07: 30 mg via ORAL
  Filled 2024-04-07: qty 1

## 2024-04-07 MED ORDER — MAGNESIUM SULFATE 40 GM/1000ML IV SOLN: 2.0000 g/h | INTRAVENOUS | Status: DC

## 2024-04-07 MED ORDER — LABETALOL HCL 5 MG/ML IV SOLN: 40.0000 mg | INTRAVENOUS | Status: DC | PRN

## 2024-04-07 MED ORDER — MAGNESIUM SULFATE BOLUS VIA INFUSION
4.0000 g | Freq: Once | INTRAVENOUS | Status: DC
  Filled 2024-04-07: qty 1000

## 2024-04-07 NOTE — MAU Note (Signed)
 Laura Lucero is a 33 y.o. at [redacted]w[redacted]d here in MAU reporting:   Coming from the Emergency room due to having sharp pain on Right side of the neck. Pt also reports swelling in feet and hands. Pt states she lost feeling in 3 of her fingers on her right hand.   No bleeding, No LOF. +FM.  Slight headache 2/10.     Vitals:   04/07/24 0600 04/07/24 0701  BP: (!) 148/94 (!) 156/91  Pulse:    Resp:  (!) 32  Temp:  98.3 F (36.8 C)  SpO2:       FHT: 137

## 2024-04-07 NOTE — Progress Notes (Signed)
 Called to bedside by RN as patient is requesting to leave.  At bedside discussing diagnosis of severe preeclampsia.  Discussed that recommendation would be to proceed with delivery and IV magnesium for seizure prophylaxis.  In the setting of prior C-section, plan would be to proceed with repeat C-section.  Reviewed that since our NICU is currently full the plan would be to transfer to [place], which is where she receives her prenatal care.  Patient states that she needs to leave and sort out childcare for her other children.  Discussed with patient that ideally, we would prefer for her to stay so that we can continue to monitor both her and her baby and start IV management of her blood pressure.  Try to reassure patient that we could give her some time to sort out arrangements for her children  Patient declined and stated that she wished to leave against our advice  Discussed with patient the risk of untreated severe preeclampsia including uncontrolled hypertension which could progress to eclampsia and other related complications.  Discuss that these complications include fetal or maternal death.   Again tried to address her concerns regarding management and patient states she wishes to go.  Patient agreeable to oral Procardia and attempt to decrease her blood pressure until she can get into be seen.  She states that she has a follow-up appointment with her OB/GYN doctor tomorrow morning  I reviewed preeclampsia precautions with patient and stated that should she note a severe headache blurry vision, right upper quadrant pain or any other acute changes in how she is feeling that she should return immediately to either [facility] or our facility.  I also suggested that once she sorts out childcare she should return anytime to the hospital  AMA form completed  Adyline Huberty, DO Attending Obstetrician & Gynecologist, [facility] for [employer], [facility]

## 2024-04-07 NOTE — ED Triage Notes (Addendum)
 Patient arrives POV c/o right sided neck pain since Wednesday. Patient also reports numbness to thumb, pointer, and index fingers of right hand; states that OBGYN is aware. Patient is [redacted] weeks pregnant. Denies any neck injury. Reports taking tylenol  at around 5am this morning. Patient a&o x4.

## 2024-04-07 NOTE — MAU Note (Signed)
 RN attempted to started IV patient stating she needs to see Dr and would like to go home

## 2024-04-07 NOTE — ED Provider Notes (Signed)
  EMERGENCY DEPARTMENT AT [facility] Provider Note   CSN: [id number] Arrival date & time: 04/07/24  9473     Patient presents with: Neck Pain   Laura Lucero is a 33 y.o. female.   The history is provided by the patient.  Neck Pain  She is currently [redacted] weeks pregnant (G9, P4, A4) and comes in with several complaints.  She is noted pain in the right side of her neck for about the last 4 days.  Pain is sharp and nonradiating.  It is worse when she turns her head to the right.  She gets temporary relief with acetaminophen .  She does not recall any trauma.  Pain is not getting any worse.  She also has numbness in her thumb and index and middle finger of her right hand.  This has been present for several months and has been stable.  She denies any weakness.  She states that pregnancy has been uncomplicated, but she has noted swelling in her legs which has gotten worse over the last 2 weeks.  She has been feeling normal fetal movement.    Prior to Admission medications   Medication Sig Start Date End Date Taking? Authorizing Provider  aspirin  EC 81 MG tablet Take 1 tablet (81 mg total) by mouth daily. for prevention of hypertension/preeclampsia later in pregnancy Patient not taking: Reported on 02/27/2024 11/01/23   Leigh Sober, MD  Ferric Maltol  (ACCRUFER ) 30 MG CAPS Take 1 capsule (30 mg total) by mouth 2 (two) times daily. Take one hour before meals or 2 hours after meals Patient not taking: Reported on 03/25/2024 03/08/24   Justino [name] HERO, CNM  meclizine  (ANTIVERT ) 50 MG tablet Take 0.5 tablets (25 mg total) by mouth 3 (three) times daily as needed for dizziness or nausea. Patient not taking: Reported on 02/27/2024 01/11/24   Jhonny Augustin BROCKS, MD  ondansetron  (ZOFRAN -ODT) 4 MG disintegrating tablet Take 1 tablet (4 mg total) by mouth every 8 (eight) hours as needed for nausea or vomiting. Patient not taking: Reported on 02/27/2024 11/01/23   Leigh Sober, MD  Prenatal Vit-Fe  Fumarate-FA (PRENATAL PO) Take by mouth. Gummy vitamins once a day.    [provider]  medroxyPROGESTERone  Acetate 150 MG/ML SUSY INJECT ONE ML (150MG  TOTAL) INTO THE MUSCLE EVERY 3 MONTHS. 04/02/19 04/06/20  Victor Claudell SAUNDERS, MD  phentermine  (ADIPEX-P ) 37.5 MG tablet Take 1 tablet (37.5 mg total) by mouth daily before breakfast. 05/30/18 04/06/20  Leonce Garnette BIRCH, MD    Allergies: Ampicillin and Penicillins    Review of Systems  Musculoskeletal:  Positive for neck pain.  All other systems reviewed and are negative.   Updated Vital Signs BP (!) 167/98 (BP Location: Right Wrist)   Pulse 77   Temp 98.5 F (36.9 C)   Resp 20   Ht 5' 3 (1.6 m)   Wt 120.2 kg   LMP 08/05/2023   SpO2 95%   Breastfeeding No   BMI 46.94 kg/m   Physical Exam Vitals and nursing note reviewed.   33 year old female, resting comfortably and in no acute distress. Vital signs are significant for elevated blood pressure. Oxygen saturation is 95%, which is normal. Head is normocephalic and atraumatic. PERRLA, EOMI. Oropharynx is clear. Neck is nontender and supple, but pain is reproduced by rotation to the right and lateral bending to the left. Back is nontender and there is no CVA tenderness. Lungs are clear without rales, wheezes, or rhonchi. Chest is nontender. Heart  has regular rate and rhythm without murmur. Abdomen has a gravid uterus which is nontender. Extremities have 2-3+ edema. Skin is warm and dry without rash. Neurologic: Mental status is normal, cranial nerves are intact.  Strength is 5/5 in all muscle groups.  Muscles tested in the arms include shoulder abduction, elbow flexion, elbow extension, wrist flexion, wrist extension, handgrip, pincer grasp.  She has markedly decreased sensation in the right thumb and 2nd and 3rd fingers which is circumferential, but normal sensation is present in the palm and dorsum of the hand.  (all labs ordered are listed, but only abnormal results  are displayed) Labs Reviewed - No data to display    Procedures   Medications Ordered in the ED - No data to display                                  Medical Decision Making  Neck pain which seems musculoskeletal.  No red flags to suggest serious pathology such as meningitis.  Paresthesias in the hand predated the neck pain and are not in a pattern of cervical radiculopathy.  Peripheral edema and elevated blood pressure in third trimester pregnancy concerning for developing preeclampsia.  I have reviewed her past records, and noted on prenatal visit of 02/27/2024 blood pressure was 109/72 with no mention of edema, prenatal visit on 03/25/2024 her blood pressure was 130/87 and mentions onset of edema.  With rising blood pressure and worsening edema I am very concerned about preeclampsia.  I have ordered CBC and comprehensive metabolic panel and urinalysis.  I discussed the case with Dr. Ozan and the maternal admissions unit who request the patient be transferred there to monitor her blood pressure and complete the workup for preeclampsia.  I do not feel she needs any more evaluation of her neck pain, paresthesias should be evaluated by neurology as an outpatient.     Final diagnoses:  Neck pain  Paresthesia  Elevated blood pressure affecting pregnancy in third trimester, antepartum  Peripheral edema    ED Discharge Orders     None          Raford Lenis, MD 04/07/24 0630

## 2024-04-07 NOTE — MAU Provider Note (Signed)
 History     CSN: [id number]  Arrival date and time: 04/07/24 9473   Event Date/Time   First Provider Initiated Contact with Patient 04/07/24 [phone]      Chief Complaint  Patient presents with   Neck Pain   Laura Lucero , a  32 y.o. [id number] at [redacted]w[redacted]d presents to MAU as a ED transfer for suspicion for PreE. Patient presented to the ED for increased swelling and neck pain. While in the ED patient had 1 severe range and a few elevated BPs. In [place] patient states that her swelling is actually improved at its current state but she has noticed that her swelling does not go down on most days. She states that her legs are so tight it hurts to bend her ankles. She denies headache, blurred vision, and epigastric pain. She also denies previous history of hypertension in pregnancy. She has OB hx significant for previous C/s x3 and planning for #4. She denies vaginal bleeding, leaking of fluid and endorses positive fetal movement.      Patient receives care in [place] at [facility]     OB History     Gravida  9   Para  4   Term  4   Preterm  0   AB  4   Living  4      SAB  2   IAB  2   Ectopic  0   Multiple  0   Live Births  4        Obstetric Comments  G1 - G3: FOB#1 G4: FOB#2 G5: FOB#1 G6 - current: FOB#3         Past Medical History:  Diagnosis Date   Anemia    Morbid obesity (HCC)    Nausea    Supervision of other normal pregnancy, antepartum 04/27/2022              Clinical Staff    Provider      Office Location      Ob/Gyn    Dating     Not found.      Language     English    Anatomy US             Flu Vaccine     offer    Genetic Screen     NIPS:       TDaP vaccine      offer    Hgb A1C or   GTT    Early :  Third trimester :       Covid    No boosters         LAB RESULTS       Rhogam           Blood Type            Feeding Plan    undecid   Therapeutic abortion in second trimester 06/15/2022   [redacted]w[redacted]d at time of procedure for socioeconomic  reasons  Anesthesia: MAC  BMI: 38  Dispo of remains: Hospital  Mementos: Footprints and Memory box  Genetics: N/A  Contraception: Desires OCPs with primary OBGYN   Placenta: Fundal     Cervical prep: 3 Dilapan, 1 laminaria   Mife: No      Past Surgical History:  Procedure Laterality Date   CESAREAN SECTION  12/20/2013   CESAREAN SECTION N/A 03/07/2016   Procedure: CESAREAN SECTION;  Surgeon: Garnette JONETTA Mace, MD;  Location: [facility] ORS;  Service: Obstetrics;  Laterality: N/A;  CESAREAN SECTION N/A 02/21/2018   Procedure: CESAREAN SECTION;  Surgeon: Victor Claudell SAUNDERS, MD;  Location: [facility] ORS;  Service: Obstetrics;  Laterality: N/A;  Time of Birth: 09:09 Sex: Female Weight:    CYST REMOVAL NECK      Family History  Problem Relation Age of Onset   Healthy Mother    Healthy Father    Healthy Sister    Healthy Sister    Healthy Sister    Healthy Sister    Healthy Maternal Grandmother    Healthy Maternal Grandfather    Healthy Paternal Grandmother    Healthy Paternal Grandfather     Social History   Tobacco Use   Smoking status: Never   Smokeless tobacco: Never  Vaping Use   Vaping status: Former   Substances: Nicotine, Flavoring  Substance Use Topics   Alcohol use: Not Currently    Alcohol/week: 1.0 standard drink of alcohol    Types: 1 Shots of liquor per week   Drug use: Not Currently    Types: Marijuana    Comment: once a month    Allergies:  Allergies  Allergen Reactions   Ampicillin Anaphylaxis    Has patient had a PCN reaction causing immediate rash, facial/tongue/throat swelling, SOB or lightheadedness with hypotension: Yes Has patient had a PCN reaction causing severe rash involving mucus membranes or skin necrosis: No Has patient had a PCN reaction that required hospitalization: No Has patient had a PCN reaction occurring within the last 10 years: Yes If all of the above answers are NO, then may proceed with Cephalosporin use. Throat swelling    Penicillins Anaphylaxis    Medications Prior to Admission  Medication Sig Dispense Refill Last Dose/Taking   aspirin  EC 81 MG tablet Take 1 tablet (81 mg total) by mouth daily. for prevention of hypertension/preeclampsia later in pregnancy (Patient not taking: Reported on 02/27/2024) 90 tablet 2    Ferric Maltol  (ACCRUFER ) 30 MG CAPS Take 1 capsule (30 mg total) by mouth 2 (two) times daily. Take one hour before meals or 2 hours after meals (Patient not taking: Reported on 03/25/2024) 60 capsule 6    meclizine  (ANTIVERT ) 50 MG tablet Take 0.5 tablets (25 mg total) by mouth 3 (three) times daily as needed for dizziness or nausea. (Patient not taking: Reported on 02/27/2024) 30 tablet 0    ondansetron  (ZOFRAN -ODT) 4 MG disintegrating tablet Take 1 tablet (4 mg total) by mouth every 8 (eight) hours as needed for nausea or vomiting. (Patient not taking: Reported on 02/27/2024) 30 tablet 2    Prenatal Vit-Fe Fumarate-FA (PRENATAL PO) Take by mouth. Gummy vitamins once a day.       Review of Systems  Constitutional:  Negative for chills, fatigue and fever.  Eyes:  Negative for pain and visual disturbance.  Respiratory:  Negative for apnea, shortness of breath and wheezing.   Cardiovascular:  Negative for chest pain and palpitations.  Gastrointestinal:  Negative for abdominal pain, constipation, diarrhea, nausea and vomiting.  Genitourinary:  Negative for difficulty urinating, dysuria, pelvic pain, vaginal bleeding, vaginal discharge and vaginal pain.  Musculoskeletal:  Positive for joint swelling. Negative for back pain.  Neurological:  Positive for weakness. Negative for seizures and headaches.  Psychiatric/Behavioral:  Negative for suicidal ideas.    Physical Exam   Blood pressure (!) 167/88, pulse (!) 53, temperature 98.3 F (36.8 C), temperature source Oral, resp. rate (!) 32, height 5' 3 (1.6 m), weight 120.2 kg, last menstrual period 08/05/2023, SpO2 99%, not currently  breastfeeding.  Physical Exam Vitals and nursing note reviewed.  Constitutional:      General: She is not in acute distress.    Appearance: Normal appearance.  HENT:     Head: Normocephalic.  Cardiovascular:     Rate and Rhythm: Tachycardia present.  Pulmonary:     Effort: Pulmonary effort is normal.  Musculoskeletal:        General: Swelling present.     Cervical back: Normal range of motion.     Right lower leg: Edema present.     Left lower leg: Edema present.     Comments: Moderate bilateral swelling to both feet and hands   Skin:    General: Skin is warm and dry.  Neurological:     Mental Status: She is alert and oriented to person, place, and time.     Deep Tendon Reflexes: Reflexes normal.  Psychiatric:        Mood and Affect: Mood normal.    FHTL 145bpm with moderate variability 15x15 accels no decels  - Toco: Quiet   Patient Vitals for the past 24 hrs:  BP Temp Temp src Pulse Resp SpO2 Height Weight  04/07/24 0745 (!) 167/88 -- -- (!) 53 -- 99 % -- --  04/07/24 0731 (!) 161/95 -- -- (!) 57 -- 98 % -- --  04/07/24 0701 (!) 156/91 98.3 F (36.8 C) Oral 62 (!) 32 -- -- --  04/07/24 0700 (!) 156/91 -- -- 62 -- -- -- --  04/07/24 0600 (!) 148/94 -- -- -- -- -- -- --  04/07/24 0536 -- -- -- -- -- -- 5' 3 (1.6 m) 120.2 kg  04/07/24 0533 (!) 167/98 98.5 F (36.9 C) -- 77 20 95 % -- --     MAU Course  Procedures Orders Placed This Encounter  Procedures   Comprehensive metabolic panel   CBC with Differential   Urinalysis, Routine w reflex microscopic -Urine, Clean Catch   Protein / creatinine ratio, urine   Vital signs   Notify physician (specify) Confirmatory reading of BP> 160/110 15 minutes later   Fetal monitoring   Continuous tocometry   Apply Hypertensive Disorders of Pregnancy Care Plan   Strict intake and output   Strict intake and output   Measure blood pressure   Results for orders placed or performed during the hospital encounter of 04/07/24  (from the past 24 hours)  Protein / creatinine ratio, urine     Status: Abnormal   Collection Time: 04/07/24  6:23 AM  Result Value Ref Range   Creatinine, Urine 114 mg/dL   Total Protein, Urine 54 mg/dL   Protein Creatinine Ratio 0.47 (H) 0.00 - 0.15 mg/mg[Cre]  Comprehensive metabolic panel     Status: Abnormal   Collection Time: 04/07/24  6:24 AM  Result Value Ref Range   Sodium 135 135 - 145 mmol/L   Potassium 3.7 3.5 - 5.1 mmol/L   Chloride 105 98 - 111 mmol/L   CO2 19 (L) 22 - 32 mmol/L   Glucose, Bld 84 70 - 99 mg/dL   BUN 5 (L) 6 - 20 mg/dL   Creatinine, Ser 9.37 0.44 - 1.00 mg/dL   Calcium 8.9 8.9 - 89.6 mg/dL   Total Protein 6.1 (L) 6.5 - 8.1 g/dL   Albumin 2.5 (L) 3.5 - 5.0 g/dL   AST 21 15 - 41 U/L   ALT 13 0 - 44 U/L   Alkaline Phosphatase 114 38 - 126 U/L   Total Bilirubin 0.7  0.0 - 1.2 mg/dL   GFR, Estimated >39 >39 mL/min   Anion gap 11 5 - 15  CBC with Differential     Status: Abnormal   Collection Time: 04/07/24  6:24 AM  Result Value Ref Range   WBC 8.1 4.0 - 10.5 K/uL   RBC 3.58 (L) 3.87 - 5.11 MIL/uL   Hemoglobin 9.3 (L) 12.0 - 15.0 g/dL   HCT 70.1 (L) 63.9 - 53.9 %   MCV 83.2 80.0 - 100.0 fL   MCH 26.0 26.0 - 34.0 pg   MCHC 31.2 30.0 - 36.0 g/dL   RDW 86.1 88.4 - 84.4 %   Platelets 247 150 - 400 K/uL   nRBC 0.0 0.0 - 0.2 %   Neutrophils Relative % 66 %   Neutro Abs 5.4 1.7 - 7.7 K/uL   Lymphocytes Relative 26 %   Lymphs Abs 2.1 0.7 - 4.0 K/uL   Monocytes Relative 6 %   Monocytes Absolute 0.5 0.1 - 1.0 K/uL   Eosinophils Relative 1 %   Eosinophils Absolute 0.1 0.0 - 0.5 K/uL   Basophils Relative 0 %   Basophils Absolute 0.0 0.0 - 0.1 K/uL   Immature Granulocytes 1 %   Abs Immature Granulocytes 0.07 0.00 - 0.07 K/uL  Urinalysis, Routine w reflex microscopic -Urine, Clean Catch     Status: Abnormal   Collection Time: 04/07/24  6:24 AM  Result Value Ref Range   Color, Urine YELLOW YELLOW   APPearance HAZY (A) CLEAR   Specific Gravity, Urine  1.017 1.005 - 1.030   pH 5.0 5.0 - 8.0   Glucose, UA NEGATIVE NEGATIVE mg/dL   Hgb urine dipstick NEGATIVE NEGATIVE   Bilirubin Urine NEGATIVE NEGATIVE   Ketones, ur NEGATIVE NEGATIVE mg/dL   Protein, ur 30 (A) NEGATIVE mg/dL   Nitrite NEGATIVE NEGATIVE   Leukocytes,Ua NEGATIVE NEGATIVE   RBC / HPF 0-5 0 - 5 RBC/hpf   WBC, UA 0-5 0 - 5 WBC/hpf   Bacteria, UA FEW (A) NONE SEEN   Squamous Epithelial / HPF 0-5 0 - 5 /HPF   Mucus PRESENT     MDM - CNM briefly reviewed Prenatal visits and noted low normal BP during current pregnancy,  - Patient has had multiple severe range BPs between ED and MAU, preE labs and hydralazine ordered for BPs.  - PCR ratio elevated at 0.47  - Concern for Severe PreElampsia @ 35 weeks pregnancy.  - Consulted Dr. Ozan given patient presentation and current clinical picture. MD recommends delivery via c/s today.  - Patient desires to leave AMA. Dr. Ozan down to discuss with patient.  - Procardia XL given to patient prior to leaving.   Assessment and Plan  - Patient decided to leave AMA.   Laura CHRISTELLA Cedar, MSN CNM  04/07/2024, 7:55 AM

## 2024-04-07 NOTE — MAU Note (Signed)
 Patient and family member called out requesting to leave. Dr. Ozan notified to come to bedside. RN at bedside while provider Dr. Ozan went over risk of leaving against her medical advise.    AMA formed by patient and Dr. Ozan

## 2024-04-07 NOTE — MAU Note (Addendum)
 Rn attempted to start IV patient refusing at this time would like more time   Patient and family will hit call bell when ready for RN to return

## 2024-04-08 ENCOUNTER — Inpatient Hospital Stay: Admitting: Anesthesiology

## 2024-04-08 ENCOUNTER — Inpatient Hospital Stay
Admission: EM | Admit: 2024-04-08 | Discharge: 2024-04-11 | DRG: 787 | Disposition: A | Discharge status: Home / Self Care | Attending: Obstetrics | Admitting: Obstetrics

## 2024-04-08 ENCOUNTER — Other Ambulatory Visit: Payer: Self-pay

## 2024-04-08 ENCOUNTER — Encounter: Payer: Self-pay | Admitting: Obstetrics & Gynecology

## 2024-04-08 ENCOUNTER — Other Ambulatory Visit: Payer: Self-pay | Admitting: Obstetrics & Gynecology

## 2024-04-08 ENCOUNTER — Encounter
Admission: EM | Disposition: A | Discharge status: Home / Self Care | Payer: Self-pay | Source: Home / Self Care | Attending: Obstetrics

## 2024-04-08 ENCOUNTER — Ambulatory Visit (INDEPENDENT_AMBULATORY_CARE_PROVIDER_SITE_OTHER): Admitting: Obstetrics & Gynecology

## 2024-04-08 ENCOUNTER — Other Ambulatory Visit (INDEPENDENT_AMBULATORY_CARE_PROVIDER_SITE_OTHER)

## 2024-04-08 VITALS — BP 149/97 | HR 78 | Wt 263.2 lb

## 2024-04-08 DIAGNOSIS — O1414 Severe pre-eclampsia complicating childbirth: Principal | ICD-10-CM

## 2024-04-08 DIAGNOSIS — O99210 Obesity complicating pregnancy, unspecified trimester: Secondary | ICD-10-CM

## 2024-04-08 DIAGNOSIS — Z7982 Long term (current) use of aspirin: Secondary | ICD-10-CM

## 2024-04-08 DIAGNOSIS — E66813 Obesity, class 3: Secondary | ICD-10-CM | POA: Diagnosis present

## 2024-04-08 DIAGNOSIS — O9081 Anemia of the puerperium: Secondary | ICD-10-CM | POA: Diagnosis not present

## 2024-04-08 DIAGNOSIS — O99213 Obesity complicating pregnancy, third trimester: Secondary | ICD-10-CM | POA: Diagnosis not present

## 2024-04-08 DIAGNOSIS — O0990 Supervision of high risk pregnancy, unspecified, unspecified trimester: Secondary | ICD-10-CM

## 2024-04-08 DIAGNOSIS — O0993 Supervision of high risk pregnancy, unspecified, third trimester: Secondary | ICD-10-CM | POA: Diagnosis not present

## 2024-04-08 DIAGNOSIS — D62 Acute posthemorrhagic anemia: Secondary | ICD-10-CM | POA: Diagnosis not present

## 2024-04-08 DIAGNOSIS — O1490 Unspecified pre-eclampsia, unspecified trimester: Secondary | ICD-10-CM | POA: Diagnosis present

## 2024-04-08 DIAGNOSIS — O0940 Supervision of pregnancy with grand multiparity, unspecified trimester: Secondary | ICD-10-CM

## 2024-04-08 DIAGNOSIS — Z880 Allergy status to penicillin: Secondary | ICD-10-CM | POA: Diagnosis not present

## 2024-04-08 DIAGNOSIS — O34211 Maternal care for low transverse scar from previous cesarean delivery: Secondary | ICD-10-CM

## 2024-04-08 DIAGNOSIS — O99013 Anemia complicating pregnancy, third trimester: Principal | ICD-10-CM

## 2024-04-08 DIAGNOSIS — O99214 Obesity complicating childbirth: Secondary | ICD-10-CM

## 2024-04-08 DIAGNOSIS — Z3A35 35 weeks gestation of pregnancy: Secondary | ICD-10-CM

## 2024-04-08 DIAGNOSIS — O1413 Severe pre-eclampsia, third trimester: Secondary | ICD-10-CM

## 2024-04-08 DIAGNOSIS — O0943 Supervision of pregnancy with grand multiparity, third trimester: Secondary | ICD-10-CM | POA: Diagnosis not present

## 2024-04-08 DIAGNOSIS — Z6841 Body Mass Index (BMI) 40.0 and over, adult: Secondary | ICD-10-CM

## 2024-04-08 LAB — COMPREHENSIVE METABOLIC PANEL WITH GFR
ALT: 13 U/L (ref 0–44)
AST: 23 U/L (ref 15–41)
Albumin: 3 g/dL — ABNORMAL LOW (ref 3.5–5.0)
Alkaline Phosphatase: 111 U/L (ref 38–126)
Anion gap: 12 (ref 5–15)
BUN: 8 mg/dL (ref 6–20)
CO2: 19 mmol/L — ABNORMAL LOW (ref 22–32)
Calcium: 8.7 mg/dL — ABNORMAL LOW (ref 8.9–10.3)
Chloride: 104 mmol/L (ref 98–111)
Creatinine, Ser: 0.7 mg/dL (ref 0.44–1.00)
GFR, Estimated: >60 mL/min (ref >60)
Glucose, Bld: 73 mg/dL (ref 70–99)
Potassium: 3.4 mmol/L — ABNORMAL LOW (ref 3.5–5.1)
Sodium: 135 mmol/L (ref 135–145)
Total Bilirubin: 0.7 mg/dL (ref 0.0–1.2)
Total Protein: 7.1 g/dL (ref 6.5–8.1)

## 2024-04-08 LAB — CBC
HCT: 30.9 % — ABNORMAL LOW (ref 36.0–46.0)
Hemoglobin: 10.1 g/dL — ABNORMAL LOW (ref 12.0–15.0)
MCH: 26.1 pg (ref 26.0–34.0)
MCHC: 32.7 g/dL (ref 30.0–36.0)
MCV: 79.8 fL — ABNORMAL LOW (ref 80.0–100.0)
Platelets: 293 K/uL (ref 150–400)
RBC: 3.87 MIL/uL (ref 3.87–5.11)
RDW: 14.1 % (ref 11.5–15.5)
WBC: 10.3 K/uL (ref 4.0–10.5)
nRBC: 0.2 % (ref 0.0–0.2)

## 2024-04-08 LAB — TYPE AND SCREEN
ABO/RH(D): A POS
Antibody Screen: NEGATIVE

## 2024-04-08 LAB — RAPID HIV SCREEN (HIV 1/2 AB+AG)
HIV 1/2 Antibodies: NONREACTIVE
HIV-1 P24 Antigen - HIV24: NONREACTIVE

## 2024-04-08 SURGERY — Surgical Case
Anesthesia: Spinal

## 2024-04-08 MED ORDER — MAGNESIUM SULFATE BOLUS VIA INFUSION
4.0000 g | Freq: Once | INTRAVENOUS | Status: AC
  Administered 2024-04-08: 4 g via INTRAVENOUS
  Filled 2024-04-08: qty 1000

## 2024-04-08 MED ORDER — DIPHENHYDRAMINE HCL 50 MG/ML IJ SOLN
12.5000 mg | INTRAMUSCULAR | Status: DC | PRN
  Administered 2024-04-08 – 2024-04-09 (×2): 12.5 mg via INTRAVENOUS
  Filled 2024-04-08: qty 1

## 2024-04-08 MED ORDER — DEXAMETHASONE SODIUM PHOSPHATE 10 MG/ML IJ SOLN
INTRAMUSCULAR | Status: AC
  Filled 2024-04-08: qty 1

## 2024-04-08 MED ORDER — PHENYLEPHRINE HCL-NACL 20-0.9 MG/250ML-% IV SOLN
INTRAVENOUS | Status: DC | PRN
  Administered 2024-04-08: 50 ug/min via INTRAVENOUS

## 2024-04-08 MED ORDER — SOD CITRATE-CITRIC ACID 500-334 MG/5ML PO SOLN
ORAL | Status: AC
  Filled 2024-04-08: qty 15

## 2024-04-08 MED ORDER — ONDANSETRON HCL 4 MG/2ML IJ SOLN
INTRAMUSCULAR | Status: DC | PRN
  Administered 2024-04-08: 4 mg via INTRAVENOUS

## 2024-04-08 MED ORDER — EPHEDRINE SULFATE (PRESSORS) 50 MG/ML IJ SOLN
INTRAMUSCULAR | Status: DC | PRN
  Administered 2024-04-08: 5 mg via INTRAVENOUS

## 2024-04-08 MED ORDER — OXYTOCIN-SODIUM CHLORIDE 30-0.9 UT/500ML-% IV SOLN
2.5000 [IU]/h | INTRAVENOUS | Status: DC
  Filled 2024-04-08: qty 1000

## 2024-04-08 MED ORDER — LABETALOL HCL 5 MG/ML IV SOLN: 40.0000 mg | INTRAVENOUS | Status: DC | PRN

## 2024-04-08 MED ORDER — OXYCODONE-ACETAMINOPHEN 5-325 MG PO TABS: 1.0000 | ORAL_TABLET | ORAL | Status: DC | PRN

## 2024-04-08 MED ORDER — MAGNESIUM SULFATE 40 GM/1000ML IV SOLN
2.0000 g/h | INTRAVENOUS | Status: DC
  Administered 2024-04-08 – 2024-04-09 (×2): 2 g/h via INTRAVENOUS
  Filled 2024-04-08 (×2): qty 1000

## 2024-04-08 MED ORDER — ONDANSETRON HCL 4 MG/2ML IJ SOLN: 4.0000 mg | Freq: Four times a day (QID) | INTRAMUSCULAR | Status: DC | PRN

## 2024-04-08 MED ORDER — PHENYLEPHRINE HCL-NACL 20-0.9 MG/250ML-% IV SOLN
INTRAVENOUS | Status: AC
  Filled 2024-04-08: qty 500

## 2024-04-08 MED ORDER — NIFEDIPINE ER OSMOTIC RELEASE 30 MG PO TB24: 30.0000 mg | ORAL_TABLET | Freq: Every day | ORAL | 1 refills | Status: DC

## 2024-04-08 MED ORDER — LIDOCAINE HCL (PF) 1 % IJ SOLN: 30.0000 mL | INTRAMUSCULAR | Status: DC | PRN

## 2024-04-08 MED ORDER — KETOROLAC TROMETHAMINE 30 MG/ML IJ SOLN
30.0000 mg | Freq: Four times a day (QID) | INTRAMUSCULAR | Status: AC | PRN
  Administered 2024-04-08: 30 mg via INTRAVENOUS
  Filled 2024-04-08: qty 1

## 2024-04-08 MED ORDER — BUPIVACAINE IN DEXTROSE 0.75-8.25 % IT SOLN
INTRATHECAL | Status: DC | PRN
  Administered 2024-04-08: 1.6 mL via INTRATHECAL

## 2024-04-08 MED ORDER — LABETALOL HCL 5 MG/ML IV SOLN: 20.0000 mg | INTRAVENOUS | Status: DC | PRN

## 2024-04-08 MED ORDER — ACETAMINOPHEN 500 MG PO TABS
ORAL_TABLET | ORAL | Status: AC
  Filled 2024-04-08: qty 2

## 2024-04-08 MED ORDER — POVIDONE-IODINE 10 % EX SWAB
2.0000 | Freq: Once | CUTANEOUS | Status: AC
  Administered 2024-04-08: 2 via TOPICAL

## 2024-04-08 MED ORDER — ACETAMINOPHEN 500 MG PO TABS: 1000.0000 mg | ORAL_TABLET | ORAL | Status: DC

## 2024-04-08 MED ORDER — KETOROLAC TROMETHAMINE 30 MG/ML IJ SOLN: 30.0000 mg | Freq: Four times a day (QID) | INTRAMUSCULAR | Status: AC | PRN

## 2024-04-08 MED ORDER — ACETAMINOPHEN 325 MG PO TABS: 650.0000 mg | ORAL_TABLET | ORAL | Status: DC | PRN

## 2024-04-08 MED ORDER — DIPHENHYDRAMINE HCL 25 MG PO CAPS: 25.0000 mg | ORAL_CAPSULE | ORAL | Status: DC | PRN

## 2024-04-08 MED ORDER — DIPHENHYDRAMINE HCL 50 MG/ML IJ SOLN
INTRAMUSCULAR | Status: AC
  Filled 2024-04-08: qty 1

## 2024-04-08 MED ORDER — NIFEDIPINE ER OSMOTIC RELEASE 30 MG PO TB24
30.0000 mg | ORAL_TABLET | Freq: Two times a day (BID) | ORAL | Status: DC
  Administered 2024-04-08 – 2024-04-10 (×3): 30 mg via ORAL
  Filled 2024-04-08 (×3): qty 1

## 2024-04-08 MED ORDER — FENTANYL CITRATE (PF) 100 MCG/2ML IJ SOLN
INTRAMUSCULAR | Status: DC | PRN
  Administered 2024-04-08: 15 ug via INTRATHECAL

## 2024-04-08 MED ORDER — ACETAMINOPHEN 500 MG PO TABS
1000.0000 mg | ORAL_TABLET | Freq: Four times a day (QID) | ORAL | Status: AC
  Administered 2024-04-08 – 2024-04-09 (×4): 1000 mg via ORAL
  Filled 2024-04-08 (×3): qty 2

## 2024-04-08 MED ORDER — MORPHINE SULFATE (PF) 0.5 MG/ML IJ SOLN
INTRAMUSCULAR | Status: AC
  Filled 2024-04-08: qty 10

## 2024-04-08 MED ORDER — LABETALOL HCL 5 MG/ML IV SOLN: 80.0000 mg | INTRAVENOUS | Status: DC | PRN

## 2024-04-08 MED ORDER — PHENYLEPHRINE HCL (PRESSORS) 10 MG/ML IV SOLN
INTRAVENOUS | Status: DC | PRN
  Administered 2024-04-08: 80 ug via INTRAVENOUS
  Administered 2024-04-08 (×2): 160 ug via INTRAVENOUS

## 2024-04-08 MED ORDER — HYDRALAZINE HCL 20 MG/ML IJ SOLN: 10.0000 mg | INTRAMUSCULAR | Status: DC | PRN

## 2024-04-08 MED ORDER — MORPHINE SULFATE (PF) 0.5 MG/ML IJ SOLN
INTRAMUSCULAR | Status: DC | PRN
  Administered 2024-04-08: .1 mg via INTRATHECAL

## 2024-04-08 MED ORDER — CLINDAMYCIN PHOSPHATE 900 MG/50ML IV SOLN
INTRAVENOUS | Status: AC
  Filled 2024-04-08: qty 50

## 2024-04-08 MED ORDER — SOD CITRATE-CITRIC ACID 500-334 MG/5ML PO SOLN: 30.0000 mL | ORAL | Status: DC | PRN

## 2024-04-08 MED ORDER — CLINDAMYCIN PHOSPHATE 900 MG/50ML IV SOLN
900.0000 mg | INTRAVENOUS | Status: AC
  Administered 2024-04-08: 900 mg via INTRAVENOUS

## 2024-04-08 MED ORDER — FENTANYL CITRATE (PF) 100 MCG/2ML IJ SOLN
INTRAMUSCULAR | Status: AC
  Filled 2024-04-08: qty 2

## 2024-04-08 MED ORDER — LACTATED RINGERS IV SOLN: INTRAVENOUS | Status: AC

## 2024-04-08 MED ORDER — ONDANSETRON HCL 4 MG/2ML IJ SOLN
INTRAMUSCULAR | Status: AC
  Filled 2024-04-08: qty 2

## 2024-04-08 MED ORDER — BUPIVACAINE HCL (PF) 0.5 % IJ SOLN
INTRAMUSCULAR | Status: AC
  Filled 2024-04-08: qty 60

## 2024-04-08 MED ORDER — DEXAMETHASONE SODIUM PHOSPHATE 10 MG/ML IJ SOLN
INTRAMUSCULAR | Status: DC | PRN
  Administered 2024-04-08: 10 mg via INTRAVENOUS

## 2024-04-08 MED ORDER — CALCIUM GLUCONATE 10 % IV SOLN
INTRAVENOUS | Status: AC
  Filled 2024-04-08: qty 10

## 2024-04-08 MED ORDER — OXYTOCIN-SODIUM CHLORIDE 30-0.9 UT/500ML-% IV SOLN: 2.5000 [IU]/h | INTRAVENOUS | Status: AC

## 2024-04-08 MED ORDER — LACTATED RINGERS IV SOLN: INTRAVENOUS | Status: DC

## 2024-04-08 MED ORDER — CHLORHEXIDINE GLUCONATE 0.12 % MT SOLN
OROMUCOSAL | Status: AC
  Administered 2024-04-08: 15 mL
  Filled 2024-04-08: qty 15

## 2024-04-08 MED ORDER — ONDANSETRON HCL 4 MG/2ML IJ SOLN: 4.0000 mg | Freq: Three times a day (TID) | INTRAMUSCULAR | Status: DC | PRN

## 2024-04-08 MED ORDER — OXYTOCIN BOLUS FROM INFUSION: 333.0000 mL | Freq: Once | INTRAVENOUS | Status: DC

## 2024-04-08 MED ORDER — BUPIVACAINE HCL 0.5 % IJ SOLN
INTRAMUSCULAR | Status: DC | PRN
  Administered 2024-04-08: 30 mL

## 2024-04-08 MED ORDER — OXYCODONE-ACETAMINOPHEN 5-325 MG PO TABS: 2.0000 | ORAL_TABLET | ORAL | Status: DC | PRN

## 2024-04-08 MED ORDER — OXYTOCIN-SODIUM CHLORIDE 30-0.9 UT/500ML-% IV SOLN
INTRAVENOUS | Status: DC | PRN
  Administered 2024-04-08: 30 [IU] via INTRAVENOUS

## 2024-04-08 MED ORDER — SOD CITRATE-CITRIC ACID 500-334 MG/5ML PO SOLN
30.0000 mL | ORAL | Status: AC
  Administered 2024-04-08: 30 mL via ORAL

## 2024-04-08 MED ORDER — GENTAMICIN SULFATE 40 MG/ML IJ SOLN
5.0000 mg/kg | INTRAVENOUS | Status: AC
  Administered 2024-04-08: 565.2 mg via INTRAVENOUS
  Filled 2024-04-08: qty 14.25

## 2024-04-08 MED ORDER — LACTATED RINGERS IV SOLN: 500.0000 mL | INTRAVENOUS | Status: DC | PRN

## 2024-04-08 MED ORDER — CLINDAMYCIN PHOSPHATE 900 MG/50ML IV SOLN
900.0000 mg | Freq: Once | INTRAVENOUS | Status: DC
Start: 2024-04-08 — End: 2024-04-08

## 2024-04-08 SURGICAL SUPPLY — 24 items
BARRIER ADHS 3X4 INTERCEED (GAUZE/BANDAGES/DRESSINGS) IMPLANT
DRESSING PEEL AND PLC PRVNA 13 (GAUZE/BANDAGES/DRESSINGS) IMPLANT
DRSG OPSITE POSTOP 4X10 (GAUZE/BANDAGES/DRESSINGS) ×1 IMPLANT
ELECTRODE REM PT RTRN 9FT ADLT (ELECTROSURGICAL) ×1 IMPLANT
EXTRACTOR VACUUM KIWI (MISCELLANEOUS) IMPLANT
GLOVE BIO SURGEON STRL SZ 6.5 (GLOVE) ×1 IMPLANT
GLOVE BIOGEL PI IND STRL 7.0 (GLOVE) ×1 IMPLANT
GLOVE BIOGEL PI IND STRL 7.5 (GLOVE) IMPLANT
GOWN STRL REUS W/ TWL LRG LVL3 (GOWN DISPOSABLE) ×2 IMPLANT
KIT PREVENA INCISION MGT 13 (CANNISTER) IMPLANT
NDL SPNL 18GX3.5 QUINCKE PK (NEEDLE) ×1 IMPLANT
NEEDLE SPNL 18GX3.5 QUINCKE PK (NEEDLE) ×1 IMPLANT
PACK C SECTION AR (MISCELLANEOUS) ×1 IMPLANT
SOLN 0.9% NACL 1000 ML (IV SOLUTION) ×1 IMPLANT
SOLN 0.9% NACL POUR BTL 1000ML (IV SOLUTION) ×1 IMPLANT
SUT PDS AB 1 TP1 96 (SUTURE) IMPLANT
SUT VIC AB 0 CT1 27XBRD ANBCTR (SUTURE) IMPLANT
SUT VIC AB 0 CT1 36 (SUTURE) IMPLANT
SUT VIC AB 0 CTX36XBRD ANBCTRL (SUTURE) IMPLANT
SUT VIC AB 2-0 CT1 TAPERPNT 27 (SUTURE) ×1 IMPLANT
SUT VIC AB 3-0 SH 27X BRD (SUTURE) IMPLANT
SUTURE MNCRL 4-0 27XMF (SUTURE) IMPLANT
SYR 30ML LL (SYRINGE) ×1 IMPLANT
TOWEL OR 17X24 6PK STRL BLUE (TOWEL DISPOSABLE) ×1 IMPLANT

## 2024-04-08 NOTE — H&P (Signed)
 Obstetrics Admission History & Physical  04/08/2024 - 5:53 PM  History of Present Illness  33 y.o. [id number] at [redacted]w[redacted]d with a new diagnosis of severe pre eclampsia. She was seen at [facility] yesterday for neck pain and was diagnosed with severe pre E. It was rec'd to her that she have a repeat cesarean section that day. She signed out AMA and agreed to come to [facility] for her ROB visit today. She did come to that visit and had a BPP of 2 out of 10. She was advised to come to [facility] and have a repeat cesarean section and magnesium sulfate seizure prophylaxis. She left the office. She was contacted via My Chart as her phone would not accept calls. She responded and has arrived to L & D. She is willing to have her repeat C/S tonight. This will be her fourth cesarean. She declines a BLT. She plans to have an IUD in the future.  She denies a headache currently, had one yesterday. She reports that the baby only generally moves  at night  Review of Systems: Positive for pedal edema.  Otherwise, her 12 point review of systems is negative or as noted in the History of Present Illness.  Patient Active Problem List   Diagnosis Date Noted   Pre-eclampsia 04/08/2024   Abnormal fetal ultrasound - umbilical cyst 11/29/2023   Obesity affecting pregnancy, antepartum 11/01/2023   Grand multiparity with current pregnancy, antepartum 11/01/2023   Supervision of high risk pregnancy, antepartum 04/27/2022   Cervical high risk human papillomavirus (HPV) DNA test positive 07/28/2019   Morbid obesity (HCC) 05/30/2018   BMI 40.0-44.9, adult (HCC) 05/30/2018   Anemia in pregnancy 02/12/2018   History of cesarean delivery, currently pregnant 03/07/2016      PMHx:  Past Medical History:  Diagnosis Date   Anemia    Morbid obesity (HCC)    Nausea    Supervision of other normal pregnancy, antepartum 04/27/2022              Clinical Staff    Provider      Office Location     [facility]    Dating     Not found.      Language      English    Anatomy US             Flu Vaccine     offer    Genetic Screen     NIPS:       TDaP vaccine      offer    Hgb A1C or   GTT    Early :  Third trimester :       Covid    No boosters         LAB RESULTS       Rhogam           Blood Type            Feeding Plan    undecid   Therapeutic abortion in second trimester 06/15/2022   [redacted]w[redacted]d at time of procedure for socioeconomic reasons  Anesthesia: MAC  BMI: 38  Dispo of remains: Hospital  Mementos: Footprints and Memory box  Genetics: N/A  Contraception: Desires OCPs with primary OBGYN   Placenta: Fundal     Cervical prep: 3 Dilapan, 1 laminaria   Mife: No     PSHx:  Past Surgical History:  Procedure Laterality Date   CESAREAN SECTION  12/20/2013   CESAREAN SECTION N/A 03/07/2016   Procedure:  CESAREAN SECTION;  Surgeon: Garnette JONETTA Mace, MD;  Location: [facility] ORS;  Service: Obstetrics;  Laterality: N/A;   CESAREAN SECTION N/A 02/21/2018   Procedure: CESAREAN SECTION;  Surgeon: Victor Claudell SAUNDERS, MD;  Location: [facility] ORS;  Service: Obstetrics;  Laterality: N/A;  Time of Birth: 09:09 Sex: Female Weight:    CYST REMOVAL NECK     Medications:  Medications Prior to Admission  Medication Sig Dispense Refill Last Dose/Taking   Prenatal Vit-Fe Fumarate-FA (PRENATAL PO) Take by mouth. Gummy vitamins once a day.   04/08/2024   aspirin  EC 81 MG tablet Take 1 tablet (81 mg total) by mouth daily. for prevention of hypertension/preeclampsia later in pregnancy (Patient not taking: Reported on 02/27/2024) 90 tablet 2    Ferric Maltol  (ACCRUFER ) 30 MG CAPS Take 1 capsule (30 mg total) by mouth 2 (two) times daily. Take one hour before meals or 2 hours after meals (Patient not taking: Reported on 03/25/2024) 60 capsule 6    meclizine  (ANTIVERT ) 50 MG tablet Take 0.5 tablets (25 mg total) by mouth 3 (three) times daily as needed for dizziness or nausea. (Patient not taking: Reported on 02/27/2024) 30 tablet 0    NIFEdipine (PROCARDIA XL) 30 MG 24 hr tablet  Take 1 tablet (30 mg total) by mouth daily. 30 tablet 1    ondansetron  (ZOFRAN -ODT) 4 MG disintegrating tablet Take 1 tablet (4 mg total) by mouth every 8 (eight) hours as needed for nausea or vomiting. (Patient not taking: Reported on 02/27/2024) 30 tablet 2      Allergies: is allergic to ampicillin and penicillins. OBHx:  OB History  Gravida Para Term Preterm AB Living  9 4 4  0 4 4  SAB IAB Ectopic Multiple Live Births  2 2 0 0 4    # Outcome Date GA Lbr Len/2nd Weight Sex Type Anes PTL Lv  9 Current           8 IAB 10/2022 [redacted]w[redacted]d    TAB        Birth Comments: Medical abortion in early first trimester  7 IAB 04/15/22 [redacted]w[redacted]d   F TAB        Birth Comments: Abortion via D&E at 22wks  6 SAB 10/2021 [redacted]w[redacted]d         5 SAB 01/13/21 [redacted]w[redacted]d   U      4 Term 02/21/18 [redacted]w[redacted]d  2680 g F CS-LTranv Spinal  LIV  3 Term 03/07/16 [redacted]w[redacted]d  3390 g M CS-LTranv Spinal  LIV  2 Term 12/20/13 [redacted]w[redacted]d  3487 g F CS-LTranv Spinal N LIV     Birth Comments: Pt developed allergic reaction to Ampicillin for GBS; req C/S for NRFHT     Complications: Fetal Intolerance  1 Term 06/05/11 [redacted]w[redacted]d  3600 g F Vag-Spont EPI N LIV    Obstetric Comments  G1 - G3: FOB#1  G4: FOB#2  G5: FOB#1  G6 - current: FOB#3    GYNHx:  History of STIs: h/o HR HPV             FHx:  Family History  Problem Relation Age of Onset   Healthy Mother    Healthy Father    Healthy Sister    Healthy Sister    Healthy Sister    Healthy Sister    Healthy Maternal Grandmother    Healthy Maternal Grandfather    Healthy Paternal Grandmother    Healthy Paternal Grandfather    Soc Hx:  Social History   Socioeconomic History  Marital status: Single    Spouse name: Not on file   Number of children: 4   Years of education: 82   Highest education level: Not on file  Occupational History   Occupation: CNA  Tobacco Use   Smoking status: Never   Smokeless tobacco: Never  Vaping Use   Vaping status: Former   Substances: Nicotine, Flavoring   Substance and Sexual Activity   Alcohol use: Not Currently    Alcohol/week: 1.0 standard drink of alcohol    Types: 1 Shots of liquor per week   Drug use: Not Currently    Types: Marijuana    Comment: once a month   Sexual activity: Yes    Partners: Male    Birth control/protection: None  Other Topics Concern   Not on file  Social History Narrative   Not on file   Social Drivers of Health   Financial Resource Strain: Low Risk  (10/09/2023)   Overall Financial Resource Strain (CARDIA)    Difficulty of Paying Living Expenses: Not hard at all  Food Insecurity: No Food Insecurity (04/08/2024)   Hunger Vital Sign    Worried About Running Out of Food in the Last Year: Never true    Ran Out of Food in the Last Year: Never true  Transportation Needs: No Transportation Needs (04/08/2024)   PRAPARE - Administrator, Civil Service (Medical): No    Lack of Transportation (Non-Medical): No  Physical Activity: Inactive (10/09/2023)   Exercise Vital Sign    Days of Exercise per Week: 0 days    Minutes of Exercise per Session: 0 min  Stress: No Stress Concern Present (10/09/2023)   [employer] of Occupational Health - Occupational Stress Questionnaire    Feeling of Stress : Not at all  Social Connections: Socially Isolated (10/09/2023)   Social Connection and Isolation Panel    Frequency of Communication with Friends and Family: Once a week    Frequency of Social Gatherings with Friends and Family: Never    Attends Religious Services: Never    Database administrator or Organizations: No    Attends Banker Meetings: Never    Marital Status: Never married  Intimate Partner Violence: Not At Risk (01/11/2024)   Humiliation, Afraid, Rape, and Kick questionnaire    Fear of Current or Ex-Partner: No    Emotionally Abused: No    Physically Abused: No    Sexually Abused: No    Objective  Patient Vitals for the past 12 hrs:  BP Temp Temp src Pulse Resp Height Weight   04/08/24 1734 -- -- -- -- -- 5' 3 (1.6 m) 119.4 kg  04/08/24 1727 (!) 145/94 98 F (36.7 C) Oral 82 18 -- --      Current Vital Signs 24h Vital Sign Ranges  T 98 F (36.7 C) Temp  Avg: 98 F (36.7 C)  Min: 98 F (36.7 C)  Max: 98 F (36.7 C)  BP (!) 145/94 BP  Min: 145/94  Max: 149/97  HR 82 Pulse  Avg: 80  Min: 78  Max: 82  RR 18 Resp  Avg: 18  Min: 18  Max: 18  SaO2     No data recorded       24 Hour I/O Current Shift I/O  Time Ins Outs No intake/output data recorded. No intake/output data recorded.   EFM: reassuring at present, category 1    General: Well nourished, well developed female in no acute distress.  Skin:  Warm and dry.  Cardiovascular: S1, S2 normal, no murmur, rub or gallop, regular rate and rhythm Respiratory:  Clear to auscultation bilateral. Normal respiratory effort Abdomen: obese, benign, gravid Neuro/Psych:  Normal mood and affect.     Labs   Recent Labs  Lab 04/07/24 0624  WBC 8.1  HGB 9.3*  HCT 29.8*  PLT 247    Recent Labs  Lab 04/07/24 0624  NA 135  K 3.7  CL 105  CO2 19*  BUN 5*  CREATININE 0.62  CALCIUM 8.9  PROT 6.1*  BILITOT 0.7  ALKPHOS 114  ALT 13  AST 21  GLUCOSE 84      Perinatal info    ABO, Rh: A/Positive/-- (04/03 1012) Antibody: Negative (04/03 1012) Rubella: <0.90 (04/03 1012) RPR: Non Reactive (08/19 1006)  HBsAg: Negative (04/03 1012)  HIV: Non Reactive (08/19 1006)  GBS:  1 hr Glucola: 80 Genetic screening normal Anatomy US  normal   Significant Maternal Lab Results: elevated PRC .42, HBG 9.3   Assessment & Plan   Severe pre eclampsia at 35.2 weeks H/o previous cesarean sections x 3 Morbid obesity  Plan for RLTCS, magnesium sulfate for seizure prophylaxis

## 2024-04-08 NOTE — Transfer of Care (Signed)
 Immediate Anesthesia Transfer of Care Note  Patient: Laura Lucero  Procedure(s) Performed: CESAREAN DELIVERY  Patient Location: Mother/Baby  Anesthesia Type:Spinal  Level of Consciousness: awake, alert , and oriented  Airway & Oxygen Therapy: Patient Spontanous Breathing  Post-op Assessment: Report given to RN and Post -op Vital signs reviewed and stable  Post vital signs: Reviewed  Last Vitals:  Vitals Value Taken Time  BP 114/78 04/08/24 20:46  Temp 36.6 C 04/08/24 20:46  Pulse 84 04/08/24 20:46  Resp 18 04/08/24 20:46  SpO2 100 % 04/08/24 20:46    Last Pain:  Vitals:   04/08/24 1906  TempSrc: Oral  PainSc: 0-No pain         Complications: No notable events documented.

## 2024-04-08 NOTE — Anesthesia Procedure Notes (Signed)
 Spinal  Patient location during procedure: OR Reason for block: surgical anesthesia Staffing Performed: resident/CRNA  Anesthesiologist: Shellie Odor, MD Resident/CRNA: Leontine Katz, CRNA Performed by: Leontine Katz, CRNA Authorized by: Shellie Odor, MD   Preanesthetic Checklist Completed: patient identified, IV checked, site marked, risks and benefits discussed, surgical consent, monitors and equipment checked, pre-op evaluation and timeout performed Spinal Block Patient position: sitting Prep: ChloraPrep and site prepped and draped Patient monitoring: heart rate, continuous pulse ox, blood pressure and cardiac monitor Approach: midline Location: L4-5 Injection technique: single-shot Needle Needle type: Introducer and Pencan  Needle gauge: 24 G Needle length: 9 cm Assessment Sensory level: T3 Events: CSF return Additional Notes Negative paresthesia. Negative blood return. Positive free-flowing CSF. Expiration date of kit checked and confirmed. Patient tolerated procedure well, without complications.

## 2024-04-08 NOTE — Progress Notes (Signed)
   PRENATAL VISIT NOTE  Subjective:  Laura Lucero is a 33 y.o. [phone] at [redacted]w[redacted]d being seen today for ongoing prenatal care.  She is currently monitored for the following issues for this high-risk pregnancy and has History of cesarean delivery, currently pregnant; Anemia in pregnancy; Morbid obesity (HCC); BMI 40.0-44.9, adult (HCC); Cervical high risk human papillomavirus (HPV) DNA test positive; Supervision of high risk pregnancy, antepartum; Obesity affecting pregnancy, antepartum; Grand multiparity with current pregnancy, antepartum; and Abnormal fetal ultrasound - umbilical cyst on their problem list.  Patient reports headache yesterday, none tod.  Contractions: Not present. Vag. Bleeding: None.  Movement: Present. Denies leaking of fluid.   The following portions of the patient's history were reviewed and updated as appropriate: allergies, current medications, past family history, past medical history, past social history, past surgical history and problem list.   Objective:    Vitals:   04/08/24 0817  BP: (!) 149/97  Pulse: 78  Weight: 263 lb 3.2 oz (119.4 kg)    Fetal Status:      Movement: Present    General: Alert, oriented and cooperative. Patient is in no acute distress.  Skin: Skin is warm and dry. No rash noted.   Cardiovascular: Normal heart rate noted  Respiratory: Normal respiratory effort, no problems with respiration noted  Abdomen: Soft, gravid, appropriate for gestational age.  Pain/Pressure: Absent     Pelvic:         Extremities: Normal range of motion.  Edema: Mild pitting, slight indentation  Mental Status: Normal mood and affect. Normal behavior. Normal judgment and thought content.   Assessment and Plan:  Pregnancy: H0E5955 at [redacted]w[redacted]d 1. Supervision of high risk pregnancy, antepartum (Primary)  - US  OB Limited; Future - Fetal nonstress test - US  FETAL BPP W/NONSTRESS; Future  2. Obesity affecting pregnancy, antepartum, unspecified obesity type   3.  Morbid obesity (HCC)   4. Grand multiparity with current pregnancy, antepartum   5. BMI 40.0-44.9, adult [facility])  Her BPP is 2 out of 10. I was in the operating room when the BPP was completed. I was called by the RN. I told her to tell the patient to come to L&D ASAP due to the risk of fetal death. The patient told the nurse that she would come to the hospital right away.  After I finished in the OR and found out that the patient had not arrived to the hospital, I (and a nurse on L&D) tried to call her. We got the message that Call cannot be completed. I will send her a Mychart message. I did call in procardia for her BP before I had to go to the OR.  No follow-ups on file.  Future Appointments  Date Time Provider Department Center  05/02/2024  8:00 AM [facility] None  05/15/2024  1:35 PM Leigh Sober, MD [facility] None    Harland JAYSON Birkenhead, MD

## 2024-04-08 NOTE — Anesthesia Preprocedure Evaluation (Signed)
 Anesthesia Evaluation  Patient identified by MRN, date of birth, ID band Patient awake    Reviewed: Allergy & Precautions, NPO status , Patient's Chart, lab work & pertinent test results  History of Anesthesia Complications Negative for: history of anesthetic complications  Airway Mallampati: III   Neck ROM: Full    Dental no notable dental hx.    Pulmonary neg pulmonary ROS   Pulmonary exam normal breath sounds clear to auscultation       Cardiovascular hypertension (PEC), Normal cardiovascular exam Rhythm:Regular Rate:Normal     Neuro/Psych negative neurological ROS     GI/Hepatic negative GI ROS,,,  Endo/Other    Class 3 obesity  Renal/GU negative Renal ROS     Musculoskeletal   Abdominal   Peds  Hematology  (+) Blood dyscrasia, anemia   Anesthesia Other Findings 33 yo H0E5955 at 25 2/7 presenting for repeat c-section for severe PEC.  Reproductive/Obstetrics (+) Pregnancy                              Anesthesia Physical Anesthesia Plan  ASA: 3 and emergent  Anesthesia Plan: Spinal   Post-op Pain Management:    Induction:   PONV Risk Score and Plan: 2 and Ondansetron  and Treatment may vary due to age or medical condition  Airway Management Planned: Natural Airway  Additional Equipment:   Intra-op Plan:   Post-operative Plan:   Informed Consent: I have reviewed the patients History and Physical, chart, labs and discussed the procedure including the risks, benefits and alternatives for the proposed anesthesia with the patient or authorized representative who has indicated his/her understanding and acceptance.     Dental Advisory Given  Plan Discussed with: CRNA  Anesthesia Plan Comments: (Patient reports no bleeding problems and no anticoagulant use.  Plan for spinal with backup GA.  Patient consented for risks of anesthesia including but not limited to:  -  adverse reactions to medications - damage to eyes, teeth, lips or other oral mucosa - nerve damage due to positioning  - risk of bleeding, infection and or nerve damage from spinal that could lead to paralysis - risk of headache or failed spinal - damage to teeth, lips or other oral mucosa - sore throat or hoarseness - damage to heart, brain, nerves, lungs, other parts of body or loss of life  Patient voiced understanding and assent.)        Anesthesia Quick Evaluation

## 2024-04-08 NOTE — Op Note (Signed)
 04/08/2024  8:51 PM  PATIENT:  Laura Lucero  33 y.o. female  PRE-OPERATIVE DIAGNOSIS:  Prior cesarean x 3, 35.2 weeks, severe pre eclampsia, morbid obesity  POST-OPERATIVE DIAGNOSIS:  same  PROCEDURE:  Procedure(s) with comments: CESAREAN DELIVERY (N/A) - REPEAT Repeat low transverse cesarean section  FINDINGS: living female infant,  weight 5 pounds 3 ounces, Apgars 9 and 9, intact placenta with 3 vessel cord, normal pelvic anatomy  SURGEON:  Surgeons and Role:    * Starla, Harland BROCKS, MD - Primary  ASSISTANTS: Slater Rains, CNM   ANESTHESIA:   local and spinal  EBL:  350 mL  BLOOD ADMINISTERED:none  DRAINS: Urinary Catheter (Foley)   LOCAL MEDICATIONS USED:  MARCAINE      SPECIMEN:  No Specimen  DISPOSITION OF SPECIMEN:  N/A  COUNTS:  YES  DICTATION: .Note written in EPIC  PLAN OF CARE: Admit to inpatient   PATIENT DISPOSITION:  PACU - hemodynamically stable.   Delay start of Pharmacological VTE agent (>24hrs) due to surgical blood loss or risk of bleeding: not applicable  The risks, benefits, and alternatives of surgery were explained, understood, accepted. Consents were signed. All questions were answered. In the operating room spinal anesthesia was applied without complication. Her abdomen and vagina were prepped and draped in the usual sterile fashion. A Foley catheter was placed, draining clear urine throughout case. Timeout procedure was done. After adequate anesthesia was assured 30 mL for 0.5% Marcaine  was injected into the subcutaneous tissue at the site of her previous cesarean. An incision was made through the previous incision. The incision was carried down through the subcutaneous tissue to the fascia. The fascia was scored the midline and extended bilaterally. The rectus fascia was separated from the rectus muscles superiorly and inferiorly. The muscles were naturally already separate in the midline. The  Excellent hemostasis was maintained. The peritoneum was  entered with hemostats. Peritoneal incision was extended bilaterally with the Bovie. The bladder blade was placed. A transverse incision was made on the well-developed lower uterine segment. The uterine incision was extended with traction on each side. Amniotomy was performed with a hemostat. Clear fluid was noted. The baby was delivered from a vertex presentation.the mouth and nostrils were suctioned prior to delivery of the shoulders.  There was a loose nuchal cord, reduced prior to delivery of the head.  The baby's cord was clamped and cut and was transferred to the NICU personnel for routine care. The placenta was delivered intact with traction. The uterus was left in situ and the interior was cleaned with a dry lap sponge. The uterine incision was closed with 2-0 Vicryl running locking suture. Excellent hemostasis was noted. By tilting the uterus each side was able to visualize the adnexa, and they were normal. The rectus fascia rectus muscles were noted be hemostatic as well. The fascia was closed with a #1 PDS loop in a running nonlocking fashion. No defects were palpable. The subcutaneous tissue was irrigated, clean, and dried. A subcuticular closure was done with a 3-0 Vicryl suture.  Excellent cosmetic results were obtained. A Provena wound vacuum was placed. She was taken to the recovery room in stable condition. She tolerated the procedure well.

## 2024-04-09 ENCOUNTER — Other Ambulatory Visit: Payer: Self-pay

## 2024-04-09 ENCOUNTER — Encounter: Payer: Self-pay | Admitting: Obstetrics & Gynecology

## 2024-04-09 LAB — RPR: RPR Ser Ql: NONREACTIVE

## 2024-04-09 LAB — CBC
HCT: 28.1 % — ABNORMAL LOW (ref 36.0–46.0)
HCT: 27.2 % — ABNORMAL LOW (ref 36.0–46.0)
Hemoglobin: 9 g/dL — ABNORMAL LOW (ref 12.0–15.0)
Hemoglobin: 8.7 g/dL — ABNORMAL LOW (ref 12.0–15.0)
MCH: 26.2 pg (ref 26.0–34.0)
MCH: 26.2 pg (ref 26.0–34.0)
MCHC: 32 g/dL (ref 30.0–36.0)
MCHC: 32 g/dL (ref 30.0–36.0)
MCV: 81.7 fL (ref 80.0–100.0)
MCV: 81.9 fL (ref 80.0–100.0)
Platelets: 265 K/uL (ref 150–400)
Platelets: 281 K/uL (ref 150–400)
RBC: 3.44 MIL/uL — ABNORMAL LOW (ref 3.87–5.11)
RBC: 3.32 MIL/uL — ABNORMAL LOW (ref 3.87–5.11)
RDW: 13.9 % (ref 11.5–15.5)
RDW: 14.1 % (ref 11.5–15.5)
WBC: 12.6 K/uL — ABNORMAL HIGH (ref 4.0–10.5)
WBC: 11.3 K/uL — ABNORMAL HIGH (ref 4.0–10.5)
nRBC: 0 % (ref 0.0–0.2)
nRBC: 0 % (ref 0.0–0.2)

## 2024-04-09 LAB — CREATININE, SERUM
Creatinine, Ser: 0.56 mg/dL (ref 0.44–1.00)
GFR, Estimated: >60 mL/min (ref >60)

## 2024-04-09 MED ORDER — SIMETHICONE 80 MG PO CHEW: 80.0000 mg | CHEWABLE_TABLET | ORAL | Status: DC | PRN

## 2024-04-09 MED ORDER — PRENATAL MULTIVITAMIN CH
1.0000 | ORAL_TABLET | Freq: Every day | ORAL | Status: DC
  Administered 2024-04-09 – 2024-04-11 (×3): 1 via ORAL
  Filled 2024-04-09 (×3): qty 1

## 2024-04-09 MED ORDER — KETOROLAC TROMETHAMINE 30 MG/ML IJ SOLN
30.0000 mg | Freq: Four times a day (QID) | INTRAMUSCULAR | Status: AC
  Administered 2024-04-09 – 2024-04-10 (×3): 30 mg via INTRAVENOUS
  Filled 2024-04-09 (×3): qty 1

## 2024-04-09 MED ORDER — ENOXAPARIN SODIUM 60 MG/0.6ML IJ SOSY
60.0000 mg | PREFILLED_SYRINGE | INTRAMUSCULAR | 0 refills | Status: AC
  Filled 2024-04-09: qty 8.4, 14d supply, fill #0

## 2024-04-09 MED ORDER — DIBUCAINE (PERIANAL) 1 % EX OINT: 1.0000 | TOPICAL_OINTMENT | CUTANEOUS | Status: DC | PRN

## 2024-04-09 MED ORDER — WITCH HAZEL-GLYCERIN EX PADS
1.0000 | MEDICATED_PAD | CUTANEOUS | Status: DC | PRN
  Filled 2024-04-09: qty 100

## 2024-04-09 MED ORDER — ZOLPIDEM TARTRATE 5 MG PO TABS: 5.0000 mg | ORAL_TABLET | Freq: Every evening | ORAL | Status: DC | PRN

## 2024-04-09 MED ORDER — ENOXAPARIN (LOVENOX) PATIENT EDUCATION KIT
PACK | Freq: Once | Status: DC
  Filled 2024-04-09: qty 1

## 2024-04-09 MED ORDER — SENNOSIDES-DOCUSATE SODIUM 8.6-50 MG PO TABS
2.0000 | ORAL_TABLET | Freq: Every day | ORAL | Status: DC
  Administered 2024-04-10 – 2024-04-11 (×2): 2 via ORAL
  Filled 2024-04-09 (×2): qty 2

## 2024-04-09 MED ORDER — SODIUM CHLORIDE 0.9% FLUSH: 3.0000 mL | INTRAVENOUS | Status: DC | PRN

## 2024-04-09 MED ORDER — NALOXONE HCL 4 MG/10ML IJ SOLN: 1.0000 ug/kg/h | INTRAVENOUS | Status: DC | PRN

## 2024-04-09 MED ORDER — SIMETHICONE 80 MG PO CHEW
80.0000 mg | CHEWABLE_TABLET | Freq: Three times a day (TID) | ORAL | Status: DC
  Administered 2024-04-09 – 2024-04-11 (×6): 80 mg via ORAL
  Filled 2024-04-09 (×6): qty 1

## 2024-04-09 MED ORDER — IBUPROFEN 600 MG PO TABS
600.0000 mg | ORAL_TABLET | Freq: Four times a day (QID) | ORAL | Status: DC
  Administered 2024-04-10 – 2024-04-11 (×5): 600 mg via ORAL
  Filled 2024-04-09 (×5): qty 1

## 2024-04-09 MED ORDER — COCONUT OIL OIL
1.0000 | TOPICAL_OIL | Status: DC | PRN
  Filled 2024-04-09: qty 7.5

## 2024-04-09 MED ORDER — MENTHOL 3 MG MT LOZG: 1.0000 | LOZENGE | OROMUCOSAL | Status: DC | PRN

## 2024-04-09 MED ORDER — MEASLES, MUMPS & RUBELLA VAC IJ SOLR
0.5000 mL | INTRAMUSCULAR | Status: DC | PRN
  Filled 2024-04-09: qty 0.5

## 2024-04-09 MED ORDER — OXYCODONE HCL 5 MG PO TABS: 5.0000 mg | ORAL_TABLET | ORAL | Status: DC | PRN

## 2024-04-09 MED ORDER — SCOPOLAMINE 1 MG/3DAYS TD PT72: 1.0000 | MEDICATED_PATCH | Freq: Once | TRANSDERMAL | Status: DC

## 2024-04-09 MED ORDER — DIPHENHYDRAMINE HCL 25 MG PO CAPS: 25.0000 mg | ORAL_CAPSULE | Freq: Four times a day (QID) | ORAL | Status: DC | PRN

## 2024-04-09 MED ORDER — ENOXAPARIN SODIUM 60 MG/0.6ML IJ SOSY
60.0000 mg | PREFILLED_SYRINGE | INTRAMUSCULAR | Status: DC
  Administered 2024-04-10 – 2024-04-11 (×2): 60 mg via SUBCUTANEOUS
  Filled 2024-04-09 (×3): qty 0.6

## 2024-04-09 MED ORDER — VARICELLA VIRUS VACCINE LIVE 1350 PFU/0.5ML IJ SUSR
0.5000 mL | INTRAMUSCULAR | Status: AC | PRN
  Administered 2024-04-11: 0.5 mL via SUBCUTANEOUS
  Filled 2024-04-09: qty 0.5

## 2024-04-09 MED ORDER — NALOXONE HCL 0.4 MG/ML IJ SOLN: 0.4000 mg | INTRAMUSCULAR | Status: DC | PRN

## 2024-04-09 NOTE — Discharge Summary (Signed)
 Postpartum Discharge Summary  Date of Service updated***     Patient Name: Laura Lucero DOB: July 26, 1990 MRN: [id number]  Date of admission: 04/08/2024 Delivery date:04/08/2024 Delivering provider: STARLA HARLAND BROCKS Date of discharge: 04/09/2024  Admitting diagnosis: Pre-eclampsia [O14.90] Intrauterine pregnancy: [redacted]w[redacted]d     Secondary diagnosis:  Principal Problem:   Pre-eclampsia Active Problems:   Severe preeclampsia, third trimester  Additional problems: ***    Discharge diagnosis: {DX.:23714}                                              Post partum procedures:{Postpartum procedures:23558} Augmentation: {Augmentation:20782} Complications: {OB Labor/Delivery Complications:20784}  Hospital course: {Courses:23701}  Magnesium Sulfate received: {Mag received:30440022} BMZ received: {BMZ received:30440023} Rhophylac:{Rhophylac received:30440032} FFM:{FFM:69559966} T-DaP:{Tdap:23962} Flu: {Qol:76036} RSV Vaccine received: {RSV:31013} Transfusion:{Transfusion received:30440034} Immunizations administered: Immunization History  Administered Date(s) Administered   MMR 03/11/2016, 02/24/2018   Pfizer Covid-19 Vaccine Bivalent Booster 87yrs & up 10/24/2019, 11/23/2019   Tdap 03/26/2017, 02/12/2018, 02/27/2024   Varicella 02/24/2018    Physical exam  Vitals:   04/09/24 0905 04/09/24 1000 04/09/24 1100 04/09/24 1205  BP: 97/64 112/67 (!) 103/58 120/72  Pulse: 84 71 96 94  Resp: 16 14 18 14   Temp:      TempSrc:      SpO2: 97% 95% 97% 99%  Weight:      Height:       General: {Exam; general:21111117} Lochia: {Desc; appropriate/inappropriate:30686::appropriate} Uterine Fundus: {Desc; firm/soft:30687} Incision: {Exam; incision:21111123} DVT Evaluation: {Exam; dvt:2111122} Labs: Lab Results  Component Value Date   WBC 12.6 (H) 04/09/2024   HGB 9.0 (L) 04/09/2024   HCT 28.1 (L) 04/09/2024   MCV 81.7 04/09/2024   PLT 265 04/09/2024      Latest Ref Rng & Units  04/08/2024    6:13 PM  CMP  Glucose 70 - 99 mg/dL 73   BUN 6 - 20 mg/dL 8   Creatinine 9.55 - 8.99 mg/dL 9.29   Sodium 864 - 854 mmol/L 135   Potassium 3.5 - 5.1 mmol/L 3.4   Chloride 98 - 111 mmol/L 104   CO2 22 - 32 mmol/L 19   Calcium 8.9 - 10.3 mg/dL 8.7   Total Protein 6.5 - 8.1 g/dL 7.1   Total Bilirubin 0.0 - 1.2 mg/dL 0.7   Alkaline Phos 38 - 126 U/L 111   AST 15 - 41 U/L 23   ALT 0 - 44 U/L 13    Edinburgh Score:    04/27/2022   11:25 AM  Edinburgh Postnatal Depression Scale Screening Tool  I have been able to laugh and see the funny side of things. 0  I have looked forward with enjoyment to things. 0  I have blamed myself unnecessarily when things went wrong. 0  I have been anxious or worried for no good reason. 0  I have felt scared or panicky for no good reason. 0  Things have been getting on top of me. 0  I have been so unhappy that I have had difficulty sleeping. 0  I have felt sad or miserable. 0  I have been so unhappy that I have been crying. 0  The thought of harming myself has occurred to me. 0  Edinburgh Postnatal Depression Scale Total 0      Data saved with a previous flowsheet row definition      After visit meds:  Allergies as of 04/09/2024       Reactions   Ampicillin Anaphylaxis   Has patient had a PCN reaction causing immediate rash, facial/tongue/throat swelling, SOB or lightheadedness with hypotension: Yes Has patient had a PCN reaction causing severe rash involving mucus membranes or skin necrosis: No Has patient had a PCN reaction that required hospitalization: No Has patient had a PCN reaction occurring within the last 10 years: Yes If all of the above answers are NO, then may proceed with Cephalosporin use. Throat swelling   Penicillins Anaphylaxis     Med Rec must be completed prior to using this SMARTLINK***      Recommend 6 weeks of prophylactic anticoagulation with LMWH or subcutaneous unfractionated heparin if 1 or more  high risk factor is present.  Recommend 14 days of prophylactic anticoagulation with LMWH or subcutaneous unfractionated heparin if 3 or more moderate risk factors are present.   Risk assessment for postpartum VTE and prophylactic treatment:  High risk factors: None Moderate risk factors: Cesarean delivery , BMI 40-60 kg/m2, and Preeclampsia   Postpartum VTE prophylaxis with LMWH ordered   Discharge home in stable condition Infant Feeding: Bottle Infant Disposition:{CHL IP OB HOME WITH FNUYZM:76418} Discharge instruction: per After Visit Summary and Postpartum booklet. Activity: Advance as tolerated. Pelvic rest for 6 weeks.  Diet: {OB diet:21111121} Anticipated Birth Control: {Birth Control:23956} Postpartum Appointment:{Outpatient follow up:23559} Additional Postpartum F/U: {PP Procedure:23957} Future Appointments: Future Appointments  Date Time Provider Department Center  05/02/2024  8:00 AM [facility] None  05/15/2024  1:35 PM Leigh Sober, MD [facility] None   Follow up Visit:      04/09/2024 Harlene LITTIE Cisco, CNM

## 2024-04-09 NOTE — Anesthesia Postprocedure Evaluation (Signed)
 Anesthesia Post Note  Patient: Laura Lucero  Procedure(s) Performed: CESAREAN DELIVERY  Patient location during evaluation: L&D Anesthesia Type: Spinal Level of consciousness: awake Pain management: satisfactory to patient Vital Signs Assessment: post-procedure vital signs reviewed and stable Respiratory status: spontaneous breathing Cardiovascular status: blood pressure returned to baseline Postop Assessment: patient able to bend at knees and no apparent nausea or vomiting Anesthetic complications: no   No notable events documented.   Last Vitals:  Vitals:   04/09/24 0600 04/09/24 0701  BP: 112/65 113/75  Pulse: 69 74  Resp: 17 16  Temp:  36.7 C  SpO2: 97% 94%    Last Pain:  Vitals:   04/09/24 0701  TempSrc: Oral  PainSc:                  Adalea Handler

## 2024-04-09 NOTE — Progress Notes (Signed)
 PHARMACIST - PHYSICIAN COMMUNICATION  CONCERNING:  Enoxaparin (Lovenox) for DVT Prophylaxis    RECOMMENDATION: Patient was prescribed enoxaprin 40mg  q24 hours for VTE prophylaxis.   Filed Weights   04/08/24 1734  Weight: 119.4 kg (263 lb 3.2 oz)    Body mass index is 46.62 kg/m.  Estimated Creatinine Clearance: 126.2 mL/min (by C-G formula based on SCr of 0.7 mg/dL).   Based on [facility] policy patient is candidate for enoxaparin 0.5mg /kg TBW SQ every 24 hours based on BMI being >30.  DESCRIPTION: Pharmacy has adjusted enoxaparin dose per [facility] policy.  Patient is now receiving enoxaparin 60 mg every 24 hours    Laura Lucero, PharmD Clinical Pharmacist  04/09/2024 12:28 PM

## 2024-04-09 NOTE — Progress Notes (Signed)
 Obstetric Postpartum/PostOperative Daily Progress Note Subjective:  33 y.o. H0E5854 post-operative day # 1 status post repeat cesarean delivery.  She is not ambulating due to continued magnesium infusion, is tolerating po, foley catheter remains in place.  Her pain is well controlled on PO pain medications. Her lochia is less than menses. Patient denies headache, denies visual disturbances, denies RUQ pain, denies chest pain, denies shortness of breath. BB Kyson in room, caring for him independently.  Medications SCHEDULED MEDICATIONS   acetaminophen   1,000 mg Oral Q6H   [START ON 04/10/2024] enoxaparin (LOVENOX) injection  40 mg Subcutaneous Q24H   enoxaparin   Does not apply Once   ketorolac   30 mg Intravenous Q6H   Followed by   NOREEN ON 04/10/2024] ibuprofen   600 mg Oral Q6H   NIFEdipine  30 mg Oral BID   prenatal multivitamin  1 tablet Oral Q1200   scopolamine   1 patch Transdermal Once   [START ON 04/10/2024] senna-docusate  2 tablet Oral Daily   simethicone   80 mg Oral TID PC    MEDICATION INFUSIONS   lactated ringers  75 mL/hr at 04/09/24 0902   magnesium sulfate 2 g/hr (04/08/24 2045)   naloxone  HCl (NARCAN ) 2 mg in dextrose  5 % 250 mL infusion     oxytocin  Stopped (04/09/24 0057)    PRN MEDICATIONS  coconut oil, witch hazel-glycerin  **AND** dibucaine, diphenhydrAMINE  **OR** diphenhydrAMINE , diphenhydrAMINE , ketorolac  **OR** ketorolac , menthol , naloxone  **AND** sodium chloride  flush, naloxone  HCl (NARCAN ) 2 mg in dextrose  5 % 250 mL infusion, ondansetron  (ZOFRAN ) IV, oxyCODONE , simethicone , zolpidem     Objective:   Vitals:   04/09/24 0905 04/09/24 1000 04/09/24 1100 04/09/24 1205  BP: 97/64 112/67 (!) 103/58 120/72  Pulse: 84 71 96 94  Resp: 16 14 18 14   Temp:      TempSrc:      SpO2: 97% 95% 97% 99%  Weight:      Height:        Current Vital Signs 24h Vital Sign Ranges  T 98 F (36.7 C) Temp  Avg: 98.1 F (36.7 C)  Min: 97.8 F (36.6 C)  Max: 98.9 F (37.2 C)   BP 120/72 BP  Min: 86/42  Max: 147/101  HR 94 Pulse  Avg: 75  Min: 63  Max: 96  RR 14 Resp  Avg: 18.3  Min: 14  Max: 26  SaO2 99 % Room Air SpO2  Avg: 97.4 %  Min: 94 %  Max: 100 %       24 Hour I/O Current Shift I/O  Time Ins Outs 09/29 0701 - 09/30 0700 In: 2883.8 [P.O.:407; I.V.:2313.5] Out: 1380 [Urine:1030] 09/30 0701 - 09/30 1900 In: 979.3 [P.O.:360; I.V.:619.3] Out: 1745 [Urine:1745]  General: NAD Cardiovascular: RRR Pulmonary: no increased work of breathing. CTAB Abdomen: non-distended, non-tender, fundus firm at level of umbilicus Inc: Clean/dry/intact, Prevena in place & suction intact Extremities: no edema, no erythema, no tenderness. SCDs in place.  Labs:  Recent Labs  Lab 04/07/24 0624 04/08/24 1813 04/09/24 0601  WBC 8.1 10.3 12.6*  HGB 9.3* 10.1* 9.0*  HCT 29.8* 30.9* 28.1*  PLT 247 293 265     Assessment:   33 y.o. H0E5854 postoperative day # 1 status post repeat cesarean section Pre-eclampsia with severe features, BP stable, asymptomatic, diuresing well, no s/sx of magnesium toxicity  Plan:  1) Acute blood loss anemia - hemodynamically stable and asymptomatic - po ferrous sulfate   2) A POS / Rubella <0.90 (04/03 1012)/ Varicella Not immune. MMR & Varivax  ordered.  3) TDAP: received prenatally  4) bottle /Contraception = unsure  5) Magnesium to continue for 24h PP. Continue nifedipine-held for SBP<90 this morning.  6) SCDs in place, lovenox to start at 24h PP.  7) Disposition: anticipate d/c home POD#3-4   Harlene LITTIE Cisco, CNM

## 2024-04-10 MED ORDER — NIFEDIPINE ER OSMOTIC RELEASE 30 MG PO TB24
30.0000 mg | ORAL_TABLET | Freq: Every day | ORAL | Status: DC
  Filled 2024-04-10: qty 1

## 2024-04-10 MED ORDER — ACETAMINOPHEN 500 MG PO TABS
1000.0000 mg | ORAL_TABLET | Freq: Four times a day (QID) | ORAL | Status: DC
  Administered 2024-04-10 – 2024-04-11 (×5): 1000 mg via ORAL
  Filled 2024-04-10 (×6): qty 2

## 2024-04-10 MED ORDER — EPINEPHRINE 0.3 MG/0.3ML IJ SOAJ: 0.3000 mg | Freq: Once | INTRAMUSCULAR | Status: DC | PRN

## 2024-04-10 MED ORDER — METHYLPREDNISOLONE SODIUM SUCC 125 MG IJ SOLR: 125.0000 mg | Freq: Once | INTRAMUSCULAR | Status: DC | PRN

## 2024-04-10 MED ORDER — DIPHENHYDRAMINE HCL 50 MG/ML IJ SOLN: 25.0000 mg | Freq: Once | INTRAMUSCULAR | Status: DC | PRN

## 2024-04-10 MED ORDER — IRON SUCROSE 500 MG IVPB - SIMPLE MED
500.0000 mg | Freq: Once | INTRAVENOUS | Status: DC
  Filled 2024-04-10: qty 275

## 2024-04-10 MED ORDER — SODIUM CHLORIDE 0.9 % IV SOLN: INTRAVENOUS | Status: AC | PRN

## 2024-04-10 MED ORDER — ALBUTEROL SULFATE (2.5 MG/3ML) 0.083% IN NEBU: 2.5000 mg | INHALATION_SOLUTION | Freq: Once | RESPIRATORY_TRACT | Status: DC | PRN

## 2024-04-10 MED ORDER — SODIUM CHLORIDE 0.9 % IV BOLUS: 500.0000 mL | Freq: Once | INTRAVENOUS | Status: DC | PRN

## 2024-04-10 NOTE — Progress Notes (Signed)
 Subjective: Postpartum Day 2: Cesarean Delivery Laura Lucero is feeling well overall. She is ambulating, voiding, and tolerating POs without difficulty. Her pain is well-controlled and her bleeding is WNL. Her mood is stable. She is bottle feeding. Her BPs have been normal to mild range. She denies HA, visual changes, and epigastric pain. Her Procardia was held once yesterday for low BP.  Objective: Vital signs in last 24 hours: Temp:  [97.9 F (36.6 C)-99.2 F (37.3 C)] 98.4 F (36.9 C) (10/01 0827) Pulse Rate:  [77-102] 88 (10/01 0827) Resp:  [14-19] 19 (10/01 0827) BP: (105-143)/(64-85) 124/81 (10/01 0827) SpO2:  [94 %-100 %] 97 % (10/01 0827)  Physical Exam:  General: alert and cooperative Lochia: appropriate Uterine Fundus: firm Incision: wound vac in place DVT Evaluation: No evidence of DVT seen on physical exam.  Recent Labs    04/09/24 0601 04/09/24 1252  HGB 9.0* 8.7*  HCT 28.1* 27.2*    Assessment/Plan: Status post Cesarean section. Doing well postoperatively.  -IV iron -Will continue with Procardia XL 30 mg once a day, increase to 60 mg daily if needed -Routine PP care -Anticipate d/c on POD 3-4 if stable.   [name] [name], CNM 04/10/2024, 11:28 AM

## 2024-04-11 ENCOUNTER — Other Ambulatory Visit: Payer: Self-pay

## 2024-04-11 MED ORDER — OXYCODONE HCL 5 MG PO TABS
5.0000 mg | ORAL_TABLET | ORAL | 0 refills | Status: AC | PRN
  Filled 2024-04-11: qty 30, 3d supply, fill #0

## 2024-04-11 MED ORDER — MEASLES, MUMPS & RUBELLA VAC ~~LOC~~ SUSR
0.5000 mL | SUBCUTANEOUS | Status: AC | PRN
  Administered 2024-04-11: 0.5 mL via SUBCUTANEOUS
  Filled 2024-04-11: qty 0.5

## 2024-04-11 MED ORDER — ACETAMINOPHEN 500 MG PO TABS: 1000.0000 mg | ORAL_TABLET | Freq: Four times a day (QID) | ORAL | Status: AC

## 2024-04-11 MED ORDER — IBUPROFEN 600 MG PO TABS
600.0000 mg | ORAL_TABLET | Freq: Four times a day (QID) | ORAL | 0 refills | Status: AC
  Filled 2024-04-11: qty 30, 8d supply, fill #0

## 2024-04-11 MED ORDER — NIFEDIPINE ER OSMOTIC RELEASE 30 MG PO TB24
60.0000 mg | ORAL_TABLET | Freq: Every day | ORAL | Status: DC
  Administered 2024-04-11: 60 mg via ORAL
  Filled 2024-04-11: qty 2

## 2024-04-11 MED ORDER — NIFEDIPINE ER 60 MG PO TB24
60.0000 mg | ORAL_TABLET | Freq: Every day | ORAL | 1 refills | Status: AC
  Filled 2024-04-11: qty 30, 30d supply, fill #0

## 2024-04-11 NOTE — Discharge Instructions (Signed)

## 2024-04-11 NOTE — Lactation Note (Signed)
 This note was copied from a baby's chart. Lactation Consultation Note  Patient Name: Boy Clancy Leiner Unijb'd Date: 04/11/2024 Age:32 hours Reason for consult: Initial assessment;Other (Comment) (Discharge Education)   Maternal Data Lactation to room for a follow up assessment w/ patient.  Mom stated that she is just bottle feeding but wanted to pump and provide BM in a bottle.  Mom expressed that the pumping was hurting, in the uterus, therefore she hasn't tried again.    Patient stated that she knows how to dry her milk up but also stated that she will take the hand pump home to use.  Feeding Mother's Current Feeding Choice: Breast Milk and Formula Nipple Type: Slow - flow  Interventions Interventions: DEBP;Education  Lactation reviewed with mom the function of the breastpump and making sure it is not hurting.  Lactation suppression education was also provided to patient.    LC provided education on the storage of formula as well.  Mom verbalized understanding.  Consult Status Consult Status: Complete    Ricky RAMAN Markie Heffernan 04/11/2024, 12:52 PM

## 2024-04-15 ENCOUNTER — Ambulatory Visit

## 2024-04-15 VITALS — BP 117/89 | HR 106 | Ht 63.0 in | Wt 238.5 lb

## 2024-04-15 DIAGNOSIS — Z0130 Encounter for examination of blood pressure without abnormal findings: Secondary | ICD-10-CM

## 2024-04-15 NOTE — Progress Notes (Cosign Needed Addendum)
    NURSE VISIT NOTE  Subjective:    Patient ID: Laura Lucero, female    DOB: 19-Apr-1991, 33 y.o.   MRN: [id number]  HPI  Patient is a 33 y.o. [phone] female who presents for BP check per order from Estil Mangle, MD.   Patient reports compliance with prescribed BP medications: yes Adalat CC 60 MGdaily Last dose of BP medication: 04/15/24 at 8am  BP Readings from Last 3 Encounters:  04/15/24 117/89  04/08/24 (!) 149/97  04/07/24 (!) 167/88   Pulse Readings from Last 3 Encounters:  04/15/24 (!) 106  04/08/24 78  04/07/24 (!) 53    Objective:    BP 117/89   Pulse (!) 106   Ht 5' 3 (1.6 m)   Wt 238 lb 8 oz (108.2 kg)   BMI 42.25 kg/m   Assessment:   No diagnosis found.   Plan:   Per Dr. Harland Birkenhead, MD:  Continue current treatment regimen.  Patient verbalized understanding of instructions.   Mathis LITTIE Getting, CMA

## 2024-04-15 NOTE — Patient Instructions (Signed)
 High Blood Pressure After Pregnancy: What to Know High blood pressure after pregnancy, or postpartum hypertension, is blood pressure that is higher than normal after childbirth. It's most common within 1 to 2 days after delivery, but it can happen later. Sometimes, it can happen up to 12 weeks or more after pregnancy. Some people have to have medical treatment to control high blood pressure and prevent serious complications. What are the causes? The cause of this condition is not well understood. In some cases, the cause may not be known. Certain conditions may increase your risk. These include: Hypertension that existed before pregnancy (chronic hypertension). Hypertension that happens as a result of pregnancy (gestational hypertension). Hypertensive disorders during pregnancy or seizures in women who have high blood pressure during pregnancy. These conditions are called pre-eclampsia and eclampsia. A condition in which the liver, platelets, and red blood cells are damaged during pregnancy (HELLP syndrome). Obesity. Diabetes. What increases the risk? If you had blood pressure problems during pregnancy, you're more likely to get this condition after giving birth. However, you can still have high blood pressure after delivery even if you didn't have problems during pregnancy. What are the signs or symptoms? Signs and symptoms may include: Headaches. These may be mild, moderate, or severe. They may also be steady, constant, or sudden (thunderclap headache). Vision changes, such as blurry vision, flashing lights, or seeing spots. Nausea and vomiting. Pain in the upper right side of your abdomen. Shortness of breath or trouble breathing. Swelling in your face or hands. A decrease in the amount of urine that you pass. You may not have any signs or symptoms. How is this diagnosed? This condition may be diagnosed based on the results of a physical exam, blood pressure measurements, and blood and pee  (urine) tests. You may also have other tests, such as a CT scan or an MRI, to check for other problems. How is this treated? If blood pressure is high enough to require treatment, your options may include: Medicines to reduce blood pressure (antihypertensives). Tell your health care provider if you are breastfeeding or if you plan to breastfeed. There are many antihypertensive medicines that are safe to take while breastfeeding. Treating medical conditions that are causing hypertension. Treating the complications of hypertension, such as seizures, stroke, or kidney problems. Your health care provider will also continue to closely watch your blood pressure. Follow these instructions at home: Learn your goal blood pressure Two numbers make up your blood pressure. The first number is called systolic pressure. The second is called diastolic pressure. An example of a blood pressure reading is "120 over 80" (or 120/80). Ask your health care provider what your goal blood pressure is. Know how to take your blood pressure To check your blood pressure, follow the instructions in the manual that came with your blood pressure monitor. This includes any instructions on what to do before taking your blood pressure. Record your blood pressure readings Follow your health care provider's instructions on how to record your blood pressure readings. Your health care provider may ask you to: Get one reading in the morning (a.m.) before you take any medicines. Get one reading in the evening (p.m.) before supper. Take at least 2 readings with each blood pressure check. This makes sure the results are correct. Wait 1-2 minutes between measurements.  General instructions Take over-the-counter and prescription medicines only as told by your health care provider. Do not use any products that contain nicotine or tobacco. These products include cigarettes, chewing  tobacco, and vaping devices, such as e-cigarettes. If you  need help quitting, ask your health care provider. Check your blood pressure as often as told by your health care provider. Return to your normal activities as told by your health care provider. Ask your health care provider what activities are safe for you. Keep all follow-up visits. Your health care provider will continue to closely watch your blood pressure. Contact a health care provider if: You have new symptoms, such as: A headache that does not get better. Dizziness. Vision changes. Nausea and vomiting. Get help right away if: You have trouble breathing. You have chest pain. You faint. You have any symptoms of a stroke. "BE FAST" is an easy way to remember the main warning signs of a stroke: B - Balance. Signs are dizziness, sudden trouble walking, or loss of balance. E - Eyes. Signs are trouble seeing or a sudden change in vision. F - Face. Signs are sudden weakness or numbness of the face, or the face or eyelid drooping on one side. A - Arms. Signs are weakness or numbness in an arm. This happens suddenly and usually on one side of the body. S - Speech. Signs are sudden trouble speaking, slurred speech, or trouble understanding what people say. T - Time. Time to call emergency services. Write down what time symptoms started. You have other signs of a stroke, such as: A sudden, severe headache with no known cause. Nausea or vomiting. Seizure. These symptoms may be an emergency. Get help right away. Call 911. Do not wait to see if the symptoms will go away. Do not drive yourself to the hospital. This information is not intended to replace advice given to you by your health care provider. Make sure you discuss any questions you have with your health care provider. Document Revised: 02/08/2023 Document Reviewed: 09/28/2021 Elsevier Patient Education  2024 [employer].

## 2024-04-17 NOTE — Progress Notes (Unsigned)
   Postoperative Cesarean Incision Check Laura Lucero is a 33 y.o. [phone] s/p repeat LTCS with Dr. Starla at [redacted]w[redacted]d for prior c-section and preeclampsia with severe features, POD#10, here today for incision check.  Subjective: The patient is not having any pain. She denies fever, chills, nausea, and vomiting. Eating a regular diet without difficulty.  Is not having regular bowel movements.  She has had two bowel movements since delivery. Activity: normal activities of daily living. Bleeding has stopped. She denies issues with her incision.  Wound vac bandage still in place, unhooked from battery pack.  Objective: BP 120/73   Pulse 91   Wt 232 lb 12.8 oz (105.6 kg)   Breastfeeding Yes   BMI 41.24 kg/m  Body mass index is 41.24 kg/m.  General:  alert and no distress  Abdomen: soft, bowel sounds active, non-tender  Incision:   healing well, no drainage, no erythema, no hernia, no seroma, no swelling, no dehiscence, incision well approximated    Assessment/Plan: Laura Lucero is a 33 y.o. [phone] s/p repeat LTCS at [redacted]w[redacted]d for prior c-section and preeclampsia with severe features, POD#10, here today for incision check, healing well. No concerns with incision today, wound vac removed and steri-strips replaced.  -Discussed at-home care, healing expectations, si/sx of infection.  -Call clinic if developing redness, discharge, or increasing pain. -Avoid vigorous scrubbing/washing of incision site; hygiene reviewed. -May trim back steri-strips if they peel; should fully remove after 1 week from today. -May resume driving and light walking. Still no heavy lifting >10-12 lbs and pelvic rest advised until cleared at 6wk postpartum visit.  -If stooling regularly x 1-2 weeks, can take stool softener every other day x 1 week, taper as tolerated.  Return in about 5 weeks (around 05/23/2024) for 6 week postpartum.   Estil Mangle, DO [facility]

## 2024-04-18 ENCOUNTER — Encounter: Payer: Self-pay | Admitting: Obstetrics

## 2024-04-18 ENCOUNTER — Ambulatory Visit (INDEPENDENT_AMBULATORY_CARE_PROVIDER_SITE_OTHER): Admitting: Obstetrics

## 2024-04-18 VITALS — BP 120/73 | HR 91 | Wt 232.8 lb

## 2024-04-18 DIAGNOSIS — Z4889 Encounter for other specified surgical aftercare: Secondary | ICD-10-CM

## 2024-04-18 NOTE — Patient Instructions (Signed)
 Postpartum Care After Cesarean Delivery The following information offers guidance on how to care for yourself from the time you deliver your baby to 6-12 weeks after delivery (postpartum period). Your health care provider may also give you more specific instructions. If you have problems or questions, contact your health care provider. How to care for yourself Perineal care     If your C-section (Cesarean section) was unplanned, and you were allowed to labor and push before delivery, you may have pain, swelling, and discomfort in the tissue between your vaginal opening and your anus (perineum). You may also have an incision in the tissue (episiotomy) or the tissue may have torn during delivery. Follow these instructions as told by your health care provider: Keep your perineum clean and dry. Use medicated pads and pain-relieving sprays and creams as directed. If you have an episiotomy or vaginal tear, check the area every day for signs of infection. Check for: Redness, swelling, or pain. Fluid or blood. Warmth. Pus or a bad smell. You may use a squirt bottle instead of wiping to clean the perineum area after you go to the bathroom. As you start healing, use the squirt bottle before wiping yourself. Make sure to wipe gently. To relieve pain caused by an episiotomy, vaginal tear, or hemorrhoids, try taking a warm sitz bath 2-3 times a day. Use a portable sitz bath that you can put over the toilet. Make sure the water covers your buttocks and perineum when you sit on the seat. Vaginal bleeding It is normal to have vaginal bleeding (lochia) after delivery. Wear a sanitary pad to absorb vaginal bleeding and discharge. During the first week after delivery, the amount and appearance of lochia is often similar to a menstrual period. Over the next few weeks, it will slowly decrease to a dry, yellow-brown discharge. For most women, lochia stops completely by 4-6 weeks after delivery. Vaginal bleeding can  vary from woman to woman. Change your sanitary pads frequently. Watch for any changes in your flow, such as: An increase in bleeding. A change in color. Large blood clots. If you pass a blood clot the size of an egg or larger, contact your health care provider. Do not use tampons or douches until your health care provider says it is safe. If you are not breastfeeding, your period should return 6-8 weeks after delivery. If you are breastfeeding, the time when your period returns varies based on whether or not you are breastfeeding exclusively. Breast care Within the first few days after delivery, your breasts may feel heavy, full, and uncomfortable (breast engorgement). You may also have milk leaking from your breasts. Your health care provider can suggest ways to help relieve breast discomfort. Breast engorgement should go away within a few days. If you are breastfeeding: Wear a bra that supports your breasts and fits you well. Keep your nipples clean and dry. Apply creams and ointments as told. You may need to use breast pads to absorb milk leakage. You may have uterine contractions every time you breastfeed for several weeks after delivery. Uterine contractions help your uterus return to its normal size. If you have any problems with breastfeeding, work with your health care provider or a Advertising copywriter. Take over-the-counter medicines as told by your health care provider to help with pain or discomfort. If you are not breastfeeding: Wear a well-fitting bra and use cold packs to help with swelling. Do not squeeze out (express) milk. This causes you to make more milk. Intimacy and  sexuality Ask your health care provider when you can engage in sexual activity. You are able to get pregnant after delivery, even if you have not had your period. If desired, talk with your health care provider about methods of family planning or birth control (contraception). Follow these instructions at  home: Medicines Take over-the-counter and prescription medicines only as told by your health care provider. If you were prescribed an antibiotic medicine, take it as told by your health care provider. Do not stop taking the antibiotic even if you start to feel better. Take your prenatal vitamins until your postpartum checkup or until your health care provider tells you it is okay to stop taking them. Activity Return to your normal activities as told by your health care provider. Ask your health care provider what activities are safe for you. You may have to avoid lifting. Ask your health care provider how much you can safely lift. If possible, have someone help you at home until you are able to do your usual activities yourself. Try to rest or take naps while your baby is sleeping. General instructions Drink enough fluid to keep your urine pale yellow. Do not drink alcohol, especially if you are breastfeeding. Do not use any products that contain nicotine or tobacco. These products include cigarettes, chewing tobacco, and vaping devices, such as e-cigarettes. If you need help quitting, ask your health care provider. Keep all follow-up visits. Your health care provider will check your healing after delivery and also check your blood pressure. Contact a health care provider if: You have: A fever. Breasts that are painful, hard, or turn red. Stopped breastfeeding and you have not had a menstrual period for 12 weeks after you stopped breastfeeding. Not breastfed at all and you have not had a menstrual period for 12 weeks after delivery. Trouble holding urine or keeping urine from leaking. A bad-smelling vaginal discharge. You have bleeding that soaks through one pad an hour or you have blood clots the size of an egg or larger. You have questions about caring for yourself or your baby. You feel unable to cope with the changes that a new baby brings to your life, and these feelings do not go away.  These include: Feeling unusually sad or worried. Having little or no interest in activities you used to enjoy. Get help right away if: You have: Chest pain or difficulty breathing. Pain, redness or swelling in an arm or leg. Severe pain or cramping in your abdomen. Thoughts about hurting yourself or your baby. You faint or have a seizure. You have any of the following symptoms and you were unable to reach your health care provider: A fever or other signs of infection. Bleeding that is soaking through one pad an hour or you have blood clots the size of an egg or larger. A severe headache that does not go away or you have a headache with vision changes. These symptoms may be an emergency. Get help right away. Call 911. Do not wait to see if the symptoms will go away. Do not drive yourself to the hospital. Get help right away if you feel like you may hurt yourself or others, or have thoughts about taking your own life. Go to your nearest emergency room or: Call 911. Call the National Suicide Prevention Lifeline at 438-797-6303 or 988. This is open 24 hours a day. Text the Crisis Text Line at 878 625 7513. This information is not intended to replace advice given to you by your health care  provider. Make sure you discuss any questions you have with your health care provider. Document Revised: 04/07/2022 Document Reviewed: 04/07/2021 Elsevier Patient Education  2024 [employer].

## 2024-04-26 ENCOUNTER — Observation Stay
Admission: EM | Admit: 2024-04-26 | Discharge: 2024-04-26 | Disposition: A | Discharge status: Home / Self Care | Source: Ambulatory Visit | Attending: Registered Nurse | Admitting: Registered Nurse

## 2024-04-26 ENCOUNTER — Encounter: Payer: Self-pay | Admitting: Obstetrics and Gynecology

## 2024-04-26 DIAGNOSIS — Y733 Surgical instruments, materials and gastroenterology and urology devices (including sutures) associated with adverse incidents: Secondary | ICD-10-CM | POA: Diagnosis not present

## 2024-04-26 DIAGNOSIS — T8131XA Disruption of external operation (surgical) wound, not elsewhere classified, initial encounter: Secondary | ICD-10-CM | POA: Diagnosis present

## 2024-04-26 NOTE — OB Triage Note (Signed)
 Pt is a G9P5 s/p RCS on 9/28 reports that her c-section scare is different that her last ones and is still wet when she touches it. Denies fever or pain.

## 2024-04-26 NOTE — Progress Notes (Signed)
 Wound assesses. Small 2mm area of pink tissue visible without any depth or signs of infection. Site appears WNL.

## 2024-04-26 NOTE — Discharge Summary (Signed)
  Physician Discharge Summary   Patient: Laura Lucero MRN: [id number] DOB: Aug 07, 1990  Admit date:     04/26/2024  Discharge date: 04/26/24  Discharge Physician: Lauraine Lakes   PCP: Department, [facility]  Discharge Diagnoses: Principal Problem:   Disruption of surgical wound, initial encounter Active Problems:   Disruption of external surgical wound  Resolved Problems:   * No resolved hospital problems. [facility] Course: Presented to [facility] triage with c/o opening along her c-section incision. She had a repeat c-section on 9/29, and has had a routine postoperative course. She noticed that there is an area of her incision that doesn't match up exactly with the other side and that there was a little bit of pink when she wiped her incision. She feels well, but is worried that something is wrong because she didn't experience anything like this with her other c-sections.  Assessment and Plan: C-section wound disruption without signs of infection. S/p repeat LTCS x 19 days. Wash wound once a day with soap and water. Air dry. Okay to apply a small covering like guaze or pantiliner to ensure that clothes don't rub against wound. Keep scheduled follow up visits.  Disposition: Home  Discharge Exam: There were no vitals filed for this visit. Abdomen is soft, obese, nontender. 1cm area on right lateral aspect of incision that has inferior overhang of incisional edge. Clean wound bed. No discharge.   Condition at discharge: good  Discharge time spent: less than 30 minutes.  Signed: Lauraine Lakes, CNM [facility] 04/26/2024

## 2024-05-02 ENCOUNTER — Inpatient Hospital Stay: Admission: RE | Admit: 2024-05-02 | Source: Ambulatory Visit

## 2024-05-06 ENCOUNTER — Inpatient Hospital Stay: Admit: 2024-05-06 | Admitting: Obstetrics

## 2024-05-15 ENCOUNTER — Encounter: Admitting: Obstetrics

## 2024-05-20 NOTE — Progress Notes (Deleted)
   OBSTETRICS POSTPARTUM CLINIC PROGRESS NOTE  Subjective:     Laura Lucero is a 33 y.o. [phone] female who presents for a postpartum visit. She is 6 weeks postpartum following a repeat low cervical transverse Cesarean section. I have reviewed the prenatal and intrapartum course. The delivery was at [redacted]w[redacted]d gestational weeks.  Anesthesia: spinal. Postpartum course has been ***. Baby's course has been ***. Baby is feeding by {breast/bottle:69}. Bleeding: patient {HAS HAS WNU:81165} not resumed menses, with No LMP recorded.. Bowel function is {normal:32111}. Bladder function is {normal:32111}. Patient {is/is not:9024} sexually active. Contraception method desired is IUD. Postpartum depression screening: {neg default:13464::negative}.  EDPS score is ***.    The following portions of the patient's history were reviewed and updated as appropriate: allergies, current medications, past family history, past medical history, past social history, past surgical history, and problem list.  Review of Systems {ros; complete:30496}   Objective:    There were no vitals taken for this visit.  General:  alert and no distress   Breasts:  inspection negative, no nipple discharge or bleeding, no masses or nodularity palpable  Lungs: clear to auscultation bilaterally  Heart:  regular rate and rhythm, S1, S2 normal, no murmur, click, rub or gallop  Abdomen: soft, non-tender; bowel sounds normal; no masses,  no organomegaly.  ***Well healed Pfannenstiel incision   Vulva:  normal  Vagina: normal vagina, no discharge, exudate, lesion, or erythema  Cervix:  no cervical motion tenderness and no lesions  Corpus: normal size, contour, position, consistency, mobility, non-tender  Adnexa:  normal adnexa and no mass, fullness, tenderness  Rectal Exam: Not performed.         Labs:  Lab Results  Component Value Date   HGB 8.7 (L) 04/09/2024     Assessment:   No diagnosis found.   Plan:    1.  Contraception: IUD 2. Will check Hgb for h/o postpartum anemia of less than 10.  3. Follow up in: {1-10:13787} {time; units:19136} or as needed.    Estil Mangle DO [facility]

## 2024-05-22 ENCOUNTER — Telehealth: Payer: Self-pay | Admitting: Obstetrics

## 2024-05-22 NOTE — Telephone Encounter (Signed)
 Contacted the patient via phone x2. No answer, voicemail is not set up. Needing to rescheduled due to Dr.Roby not being in the office. Offering 12/2 with Dr. Starla.

## 2024-05-23 ENCOUNTER — Ambulatory Visit: Admitting: Obstetrics

## 2024-05-23 DIAGNOSIS — Z130 Encounter for screening for diseases of the blood and blood-forming organs and certain disorders involving the immune mechanism: Secondary | ICD-10-CM

## 2024-05-23 DIAGNOSIS — Z30430 Encounter for insertion of intrauterine contraceptive device: Secondary | ICD-10-CM

## 2024-05-23 DIAGNOSIS — Z01812 Encounter for preprocedural laboratory examination: Secondary | ICD-10-CM

## 2024-05-23 NOTE — Telephone Encounter (Signed)
 Reached out to pt (2x) to reschedule Postpartum visit that was scheduled with Dr. Leigh on 05/23/2024 at 10:35.  Dr. Leigh was called to surgery.  Corresponded with pt via MyChart about rescheduling the appt.
